# Patient Record
Sex: Male | Born: 1995 | Race: Black or African American | Hispanic: No | Marital: Single | Smoking: Current every day smoker
Health system: Southern US, Community
[De-identification: ages and names within clinical notes are randomized; demographics above are authoritative.]

## PROBLEM LIST (undated history)

## (undated) ENCOUNTER — Ambulatory Visit

## (undated) DIAGNOSIS — F319 Bipolar disorder, unspecified: Secondary | ICD-10-CM

## (undated) DIAGNOSIS — R519 Headache, unspecified: Secondary | ICD-10-CM

## (undated) HISTORY — DX: Headache, unspecified: R51.9

---
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
## 2016-05-26 ENCOUNTER — Emergency Department (HOSPITAL_COMMUNITY)
Admission: EM | Admit: 2016-05-26 | Discharge: 2016-05-27 | Disposition: A | Discharge status: Home / Self Care | Payer: Medicaid Other | Attending: Emergency Medicine | Admitting: Emergency Medicine

## 2016-05-26 ENCOUNTER — Emergency Department (HOSPITAL_COMMUNITY): Payer: Medicaid Other

## 2016-05-26 ENCOUNTER — Encounter (HOSPITAL_COMMUNITY): Payer: Self-pay

## 2016-05-26 DIAGNOSIS — M545 Low back pain, unspecified: Secondary | ICD-10-CM

## 2016-05-26 DIAGNOSIS — M542 Cervicalgia: Secondary | ICD-10-CM | POA: Diagnosis not present

## 2016-05-26 DIAGNOSIS — G8929 Other chronic pain: Secondary | ICD-10-CM

## 2016-05-26 DIAGNOSIS — F17290 Nicotine dependence, other tobacco product, uncomplicated: Secondary | ICD-10-CM | POA: Insufficient documentation

## 2016-05-26 DIAGNOSIS — M546 Pain in thoracic spine: Secondary | ICD-10-CM | POA: Diagnosis not present

## 2016-05-26 DIAGNOSIS — R202 Paresthesia of skin: Secondary | ICD-10-CM | POA: Diagnosis not present

## 2016-05-26 LAB — URINALYSIS, ROUTINE W REFLEX MICROSCOPIC
Bilirubin Urine: NEGATIVE
Glucose, UA: NEGATIVE mg/dL
Hgb urine dipstick: NEGATIVE
KETONES UR: NEGATIVE mg/dL
Leukocytes, UA: NEGATIVE
Nitrite: NEGATIVE
Protein, ur: NEGATIVE mg/dL
Specific Gravity, Urine: 1.019 (ref 1.005–1.030)
pH: 7 (ref 5.0–8.0)

## 2016-05-26 LAB — CBC
HEMATOCRIT: 43.6 % (ref 39.0–52.0)
HEMOGLOBIN: 14.8 g/dL (ref 13.0–17.0)
MCH: 29 pg (ref 26.0–34.0)
MCHC: 33.9 g/dL (ref 30.0–36.0)
MCV: 85.3 fL (ref 78.0–100.0)
Platelets: 209 10*3/uL (ref 150–400)
RBC: 5.11 MIL/uL (ref 4.22–5.81)
RDW: 14 % (ref 11.5–15.5)
WBC: 5.9 10*3/uL (ref 4.0–10.5)

## 2016-05-26 LAB — BASIC METABOLIC PANEL
ANION GAP: 9 (ref 5–15)
BUN: 10 mg/dL (ref 6–20)
CO2: 25 mmol/L (ref 22–32)
Calcium: 9.6 mg/dL (ref 8.9–10.3)
Chloride: 103 mmol/L (ref 101–111)
Creatinine, Ser: 0.97 mg/dL (ref 0.61–1.24)
Glucose, Bld: 116 mg/dL — ABNORMAL HIGH (ref 65–99)
POTASSIUM: 3.7 mmol/L (ref 3.5–5.1)
SODIUM: 137 mmol/L (ref 135–145)

## 2016-05-26 MED ORDER — METHOCARBAMOL 500 MG PO TABS
1000.0000 mg | ORAL_TABLET | Freq: Once | ORAL | Status: AC
  Administered 2016-05-27: 1000 mg via ORAL
  Filled 2016-05-26: qty 2

## 2016-05-26 MED ORDER — HYDROCODONE-ACETAMINOPHEN 5-325 MG PO TABS
1.0000 | ORAL_TABLET | Freq: Once | ORAL | Status: AC
  Administered 2016-05-27: 1 via ORAL
  Filled 2016-05-26: qty 1

## 2016-05-26 NOTE — ED Triage Notes (Signed)
Pt states he has had chronic back pain X2 years. He reports at times he cannot feel his finger tips or his ears. He also reports decreased appetite and generalized fatigue. Pt alert and oriented, ambulatory.

## 2016-05-26 NOTE — ED Notes (Signed)
Patient ambulatory to restroom and back with steady gait, slow to get out of the bed, but once he is up and walking, he states it gets easier but he still has severe back pain.

## 2016-05-26 NOTE — ED Notes (Signed)
Patient transported to X-ray 

## 2016-05-26 NOTE — ED Notes (Signed)
Pt angry about wait time. Attempted to explain delay, pt would not allow RN to explain delay. Pt is next to go back to a room

## 2016-05-26 NOTE — ED Provider Notes (Signed)
[facility]-EMERGENCY DEPT Provider Note   CSN: [id number] Arrival date & time: 05/26/16  1829     History   Chief Complaint Chief Complaint  Patient presents with  . Back Pain    HPI Jeffrey Chang is a 21 y.o. male.  Patient presents with complaint of worsening back pain in the upper back and lower back. Patient states that he has had this for about 2 years. He denies any acute injury but states that he had jobs that were very strenuous on his back with heavy lifting and pushing. He reports symptoms wax and wane but has gradually been worsening over this timeframe. Today he was unable to get out of bed to go to work prompting emergency department visit. He reports having paresthesias and numbness in his bilateral lower and upper extremities. No severe weakness however. He is able to walk but avoids twisting because this makes his pain worse. Sometimes he will take ibuprofen but doesn't like to take medications. He reports seeing a doctor once approximately one year ago when he had x-rays performed but no follow-up. The onset of this condition was acute on chronic. The course is constant.       History reviewed. No pertinent past medical history.  There are no active problems to display for this patient.   History reviewed. No pertinent surgical history.     Home Medications    Prior to Admission medications   Not on File    Family History History reviewed. No pertinent family history.  Social History Social History  Substance Use Topics  . Smoking status: Current Every Day Smoker    Types: Cigars  . Smokeless tobacco: Never Used  . Alcohol use No     Comment: occ     Allergies   Patient has no known allergies.   Review of Systems Review of Systems  Constitutional: Negative for fever and unexpected weight change.  Gastrointestinal: Negative for constipation.       Neg for fecal incontinence  Genitourinary: Negative for difficulty urinating, flank pain and  hematuria.       Negative for urinary incontinence or retention  Musculoskeletal: Positive for back pain.  Neurological: Positive for numbness. Negative for weakness.       Negative for saddle paresthesias      Physical Exam Updated Vital Signs BP (!) 152/97 (BP Location: Left Arm)   Pulse 78   Temp 98.3 F (36.8 C) (Oral)   Resp 16   SpO2 100%   Physical Exam  Constitutional: He appears well-developed and well-nourished.  HENT:  Head: Normocephalic and atraumatic.  Eyes: Conjunctivae are normal.  Neck: Normal range of motion.  Abdominal: Soft. There is no tenderness. There is no CVA tenderness.  Musculoskeletal:       Cervical back: He exhibits tenderness. He exhibits normal range of motion and no bony tenderness.       Thoracic back: He exhibits tenderness. He exhibits normal range of motion and no bony tenderness.       Lumbar back: He exhibits tenderness. He exhibits normal range of motion and no bony tenderness.       Back:  No step-off noted with palpation of spine.   Neurological: He is alert. He has normal reflexes. No sensory deficit. He exhibits normal muscle tone.  5/5 strength in entire lower extremities bilaterally. Reports decreased sensation in bilateral lower extremities to light touch. Patient ambulatory without foot drop but turns in a fashion where he does not twist his  back.  Skin: Skin is warm and dry.  Psychiatric: He has a normal mood and affect.  Nursing note and vitals reviewed.    ED Treatments / Results  Labs (all labs ordered are listed, but only abnormal results are displayed) Labs Reviewed  BASIC METABOLIC PANEL - Abnormal; Notable for the following:       Result Value   Glucose, Bld 116 (*)    All other components within normal limits  URINALYSIS, ROUTINE W REFLEX MICROSCOPIC - Abnormal; Notable for the following:    Color, Urine AMBER (*)    APPearance TURBID (*)    Bacteria, UA RARE (*)    Squamous Epithelial / LPF 0-5 (*)     Crystals PRESENT (*)    All other components within normal limits  CBC  CBG MONITORING, ED    EKG  EKG Interpretation None       Radiology Dg Cervical Spine Complete  Result Date: 05/26/2016 CLINICAL DATA:  Initial evaluation for chronic worsening neck and lower back pain, extremity numbness. EXAM: CERVICAL SPINE - COMPLETE 4+ VIEW COMPARISON:  None. FINDINGS: There is no evidence of cervical spine fracture or prevertebral soft tissue swelling. Alignment is normal. No other significant bone abnormalities are identified. Mild degenerative spondylolysis noted at C4-5. No significant bony foraminal narrowing. IMPRESSION: 1. No radiographic evidence for acute abnormality within cervical spine. 2. 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 Electronically Signed   By: Rise Mu M.D.   On: 05/26/2016 23:56   Dg Lumbar Spine Complete  Result Date: 05/26/2016 CLINICAL DATA:  Chronic lower back pain for 2 years. Initial encounter. EXAM: LUMBAR SPINE - COMPLETE 4+ VIEW COMPARISON:  None. FINDINGS: There is no evidence of fracture or subluxation. Vertebral bodies demonstrate normal height and alignment. Intervertebral disc spaces are preserved. The visualized neural foramina are grossly unremarkable in appearance. The visualized bowel gas pattern is unremarkable in appearance; air and stool are noted within the colon. The sacroiliac joints are within normal limits. IMPRESSION: No evidence of fracture or subluxation along the lumbar spine. Electronically Signed   By: Roanna Raider M.D.   On: 05/26/2016 23:55    Procedures Procedures (including critical care time)  Medications Ordered in ED Medications - No data to display   Initial Impression / Assessment and Plan / ED Course  I have reviewed the triage vital signs and the nursing notes.  Pertinent labs & imaging results that were available during my care of the patient were reviewed by me and considered in my medical decision making  (see chart for details).     11:20 PM Patient seen and examined. X-rays ordered. Medications ordered.   Vital signs reviewed and are as follows: Vitals:   05/26/16 1836  BP: (!) 152/97  Pulse: 78  Resp: 16  Temp: 98.3 F (36.8 C)   Will check imaging of the neck and lumbar spine tonight. Discussed limitations of these tests with patient and mother. Workup and treatment is being hindered by the fact that he does not have a primary care physician. He does not have any red flags. Discussed that likely we will only be able to treat his symptoms tonight. Discussed that he will need to follow-up with PCP and likely a specialist for further evaluation.   12:17 AM Patient and mother updated on x-ray results.   No red flag s/s of low back pain. Patient was counseled on back pain precautions and told to do activity as tolerated but do not lift, push, or  pull heavy objects more than 10 pounds for the next week.  Patient counseled to use ice or heat on back for no longer than 15 minutes every hour.   Patient counseled on proper use of muscle relaxant medication.  They were told not to drink alcohol, drive any vehicle, or do any dangerous activities while taking this medication.  Patient verbalized understanding.  Patient urged to follow-up with PCP if pain does not improve with treatment and rest or if pain becomes recurrent. Urged to return with worsening severe pain, loss of bowel or bladder control, trouble walking.   The patient verbalizes understanding and agrees with the plan.   Final Clinical Impressions(s) / ED Diagnoses   Final diagnoses:  Chronic bilateral thoracic back pain  Chronic bilateral low back pain without sciatica  Paresthesias   Patient with acute on chronic back pain. No new neurological deficits, does complain of constant ongoing decreased sensation. Patient is ambulatory. No warning symptoms of back pain including: fecal incontinence, urinary retention or overflow  incontinence, night sweats, waking from sleep with back pain, unexplained fevers or weight loss, h/o cancer, IVDU, recent trauma. No concern for cauda equina, epidural abscess, or other serious cause of back pain. Conservative measures such as rest, ice/heat and pain medicine indicated with PCP follow-up if no improvement with conservative management.    New Prescriptions New Prescriptions   METHOCARBAMOL (ROBAXIN) 500 MG TABLET    Take 2 tablets (1,000 mg total) by mouth 4 (four) times daily.   NAPROXEN (NAPROSYN) 500 MG TABLET    Take 1 tablet (500 mg total) by mouth 2 (two) times daily.     Renne Crigler, PA-C 05/27/16 0019    Renne Crigler, PA-C 05/27/16 0020    Marily Memos, MD 05/27/16 4098

## 2016-05-27 MED ORDER — NAPROXEN 500 MG PO TABS: 500.0000 mg | ORAL_TABLET | Freq: Two times a day (BID) | ORAL | 0 refills | Status: DC

## 2016-05-27 MED ORDER — METHOCARBAMOL 500 MG PO TABS: 1000.0000 mg | ORAL_TABLET | Freq: Four times a day (QID) | ORAL | 0 refills | Status: DC

## 2016-05-27 NOTE — Discharge Instructions (Signed)
Please read and follow all provided instructions.  Your diagnoses today include:  1. Chronic bilateral thoracic back pain   2. Chronic bilateral low back pain without sciatica   3. Paresthesias     Tests performed today include:  Vital signs - see below for your results today  Medications prescribed:   Naproxen - anti-inflammatory pain medication  Do not exceed  naproxen every 12 hours, take with food  You have been prescribed an anti-inflammatory medication or NSAID. Take with food. Take smallest effective dose for the shortest duration needed for your pain. Stop taking if you experience stomach pain or vomiting.    Robaxin (methocarbamol) - muscle relaxer medication  DO NOT drive or perform any activities that require you to be awake and alert because this medicine can make you drowsy.   Take any prescribed medications only as directed.  Home care instructions:   Follow any educational materials contained in this packet  Please rest, use ice or heat on your back for the next several days  Do not lift, push, pull anything more than 10 pounds for the next week  Follow-up instructions: Please follow-up with your primary care provider in the next 1 week for further evaluation of your symptoms.   Return instructions:  SEEK IMMEDIATE MEDICAL ATTENTION IF YOU HAVE:  New numbness, tingling, weakness, or problem with the use of your arms or legs  Severe back pain not relieved with medications  Loss control of your bowels or bladder  Increasing pain in any areas of the body (such as chest or abdominal pain)  Shortness of breath, dizziness, or fainting.   Worsening nausea (feeling sick to your stomach), vomiting, fever, or sweats  Any other emergent concerns regarding your health   Additional Information:  Your vital signs today were: BP (!) 152/97 (BP Location: Left Arm)    Pulse 78    Temp 98.3 F (36.8 C) (Oral)    Resp 16    SpO2 100%  If your blood  pressure (BP) was elevated above 135/85 this visit, please have this repeated by your doctor within one month. --------------

## 2016-06-05 ENCOUNTER — Encounter: Payer: Self-pay | Admitting: Physician Assistant

## 2016-06-05 ENCOUNTER — Ambulatory Visit: Payer: Medicaid Other | Attending: Internal Medicine | Admitting: Physician Assistant

## 2016-06-05 VITALS — BP 147/90 | HR 98 | Temp 98.4°F | Resp 16 | Wt 146.4 lb

## 2016-06-05 DIAGNOSIS — M4302 Spondylolysis, cervical region: Secondary | ICD-10-CM | POA: Diagnosis not present

## 2016-06-05 DIAGNOSIS — F1290 Cannabis use, unspecified, uncomplicated: Secondary | ICD-10-CM | POA: Diagnosis not present

## 2016-06-05 DIAGNOSIS — M542 Cervicalgia: Secondary | ICD-10-CM

## 2016-06-05 DIAGNOSIS — M545 Low back pain: Secondary | ICD-10-CM | POA: Diagnosis present

## 2016-06-05 NOTE — Progress Notes (Signed)
Patient ID: Jeffrey Chang, male   DOB: 1996-01-15, 21 y.o.   MRN: [id number]    Pavle Wiler, is a 21 y.o. male  [id number]  [id number]  DOB - Jul 06, 1995  Subjective:  Chief Complaint and HPI: Jeffrey Chang is a 21 y.o. male here today to establish care and for a follow up visit After being seen in the ED on 05/26/2016 for a 2 year h/o U&LBP.  Imaging of L-S spine was negative.  Imaging of C-spine with mild spondylolysis at C4-5 and otherwise negative.  He tried the Naproxen and methocarbamol and they made him feel strange.  His pain started to improve after about 3 days.  He is pain free now and even showed me a video of himself doing a back bend yesterday.  He admits to frequent marijuana use.     ED/Hospital notes reviewed.   Social History:  Currently working but about to go back to school. Family history:  ROS:   Constitutional:  No f/c, No night sweats, No unexplained weight loss. EENT:  No vision changes, No blurry vision, No hearing changes. No mouth, throat, or ear problems.  Respiratory: No cough, No SOB Cardiac: No CP, no palpitations GI:  No abd pain, No N/V/D. GU: No Urinary s/sx Musculoskeletal: No joint pain Neuro: No headache, no dizziness, no motor weakness.  Skin: No rash Endocrine:  No polydipsia. No polyuria.  Psych: Denies SI/HI  No problems updated.  ALLERGIES: No Known Allergies  PAST MEDICAL HISTORY: No past medical history on file.  MEDICATIONS AT HOME: Prior to Admission medications   Medication Sig Start Date End Date Taking? Authorizing Provider  methocarbamol (ROBAXIN) 500 MG tablet Take 2 tablets (1,000 mg total) by mouth 4 (four) times daily. 05/27/16   Renne Crigler, PA-C  naproxen (NAPROSYN) 500 MG tablet Take 1 tablet (500 mg total) by mouth 2 (two) times daily. 05/27/16   Renne Crigler, PA-C     Objective:  EXAM:   Vitals:   06/05/16 1548  BP: (!) 147/90  Pulse: 98  Resp: 16  Temp: 98.4 F (36.9 C)  TempSrc: Oral    SpO2: 98%  Weight: 146 lb 6.4 oz (66.4 kg)    General appearance : A&OX3. NAD. Non-toxic-appearing HEENT: Atraumatic and Normocephalic.  PERRLA. EOM intact.   Neck: supple, no JVD. No cervical lymphadenopathy. No thyromegaly Chest/Lungs:  Breathing-non-labored, Good air entry bilaterally, breath sounds normal without rales, rhonchi, or wheezing  CVS: S1 S2 regular, no murmurs, gallops, rubs  Extremities: Bilateral Lower Ext shows no edema, both legs are warm to touch with = pulse throughout Neurology:  CN II-XII grossly intact, Non focal.   Psych:  Affect is manic.  Pressured speech.  Overly expressive.  Grandiose thought processes with telling me he mastered calculus in 9th grade, can burn himself without feeling pain, is an expert in psychology, stretches for an hour every day, is going to start back to school to be an engineer-doctor.  TP is tangential and circumferential.  Very demonstrative and physically expressive. Skin:  No Rash  Data Review No results found for: HGBA1C   Assessment & Plan   1. Low back pain, unspecified back pain laterality, unspecified chronicity, with sciatica presence unspecified Resolved  2. Neck pain Resolved  3.  Marijuana use-cessation advised  4. ?Objective Mania w/o SI/HI or acute safety issues.  This is my first and only time seeing the patient.  He is very happy and upbeat.  Patient have been counseled extensively about nutrition  and exercise  Return in about 1 month (around 07/05/2016) for assign PCP and CPE with fasting blood work.  The patient was given clear instructions to go to ER or return to medical center if symptoms don't improve, worsen or new problems develop. The patient verbalized understanding. The patient was told to call to get lab results if they haven't heard anything in the next week.     Georgian Co, PA-C [facility] [place], [place] [phone]   06/05/2016, 4:01 PM

## 2016-07-06 ENCOUNTER — Encounter (HOSPITAL_COMMUNITY): Payer: Self-pay

## 2016-07-06 ENCOUNTER — Emergency Department (HOSPITAL_COMMUNITY)
Admission: EM | Admit: 2016-07-06 | Discharge: 2016-07-07 | Disposition: A | Discharge status: Home / Self Care | Payer: Managed Care, Other (non HMO) | Attending: Emergency Medicine | Admitting: Emergency Medicine

## 2016-07-06 ENCOUNTER — Emergency Department (HOSPITAL_COMMUNITY): Payer: Managed Care, Other (non HMO)

## 2016-07-06 DIAGNOSIS — F309 Manic episode, unspecified: Secondary | ICD-10-CM | POA: Diagnosis not present

## 2016-07-06 DIAGNOSIS — Z046 Encounter for general psychiatric examination, requested by authority: Secondary | ICD-10-CM

## 2016-07-06 DIAGNOSIS — F12150 Cannabis abuse with psychotic disorder with delusions: Secondary | ICD-10-CM | POA: Diagnosis not present

## 2016-07-06 DIAGNOSIS — F918 Other conduct disorders: Secondary | ICD-10-CM | POA: Diagnosis present

## 2016-07-06 DIAGNOSIS — R930 Abnormal findings on diagnostic imaging of skull and head, not elsewhere classified: Secondary | ICD-10-CM | POA: Insufficient documentation

## 2016-07-06 DIAGNOSIS — F12151 Cannabis abuse with psychotic disorder with hallucinations: Secondary | ICD-10-CM | POA: Diagnosis not present

## 2016-07-06 DIAGNOSIS — F17210 Nicotine dependence, cigarettes, uncomplicated: Secondary | ICD-10-CM | POA: Diagnosis not present

## 2016-07-06 DIAGNOSIS — F17290 Nicotine dependence, other tobacco product, uncomplicated: Secondary | ICD-10-CM | POA: Insufficient documentation

## 2016-07-06 DIAGNOSIS — Z79899 Other long term (current) drug therapy: Secondary | ICD-10-CM | POA: Diagnosis not present

## 2016-07-06 HISTORY — DX: Bipolar disorder, unspecified: F31.9

## 2016-07-06 LAB — CBC WITH DIFFERENTIAL/PLATELET
Basophils Absolute: 0.1 10*3/uL (ref 0.0–0.1)
Basophils Relative: 1 %
EOS ABS: 0.2 10*3/uL (ref 0.0–0.7)
EOS PCT: 3 %
HCT: 40.9 % (ref 39.0–52.0)
HEMOGLOBIN: 13.4 g/dL (ref 13.0–17.0)
Lymphocytes Relative: 34 %
Lymphs Abs: 2 10*3/uL (ref 0.7–4.0)
MCH: 28.8 pg (ref 26.0–34.0)
MCHC: 32.8 g/dL (ref 30.0–36.0)
MCV: 88 fL (ref 78.0–100.0)
MONOS PCT: 6 %
Monocytes Absolute: 0.4 10*3/uL (ref 0.1–1.0)
Neutro Abs: 3.3 10*3/uL (ref 1.7–7.7)
Neutrophils Relative %: 56 %
Platelets: 204 10*3/uL (ref 150–400)
RBC: 4.65 MIL/uL (ref 4.22–5.81)
RDW: 14.7 % (ref 11.5–15.5)
WBC: 5.9 10*3/uL (ref 4.0–10.5)

## 2016-07-06 LAB — RAPID URINE DRUG SCREEN, HOSP PERFORMED
AMPHETAMINES: NOT DETECTED
BENZODIAZEPINES: NOT DETECTED
Barbiturates: NOT DETECTED
Cocaine: NOT DETECTED
OPIATES: NOT DETECTED
Tetrahydrocannabinol: POSITIVE — AB

## 2016-07-06 LAB — ETHANOL: Alcohol, Ethyl (B): <5 mg/dL (ref <5)

## 2016-07-06 LAB — COMPREHENSIVE METABOLIC PANEL
ALT: 16 U/L — ABNORMAL LOW (ref 17–63)
ANION GAP: 6 (ref 5–15)
AST: 39 U/L (ref 15–41)
Albumin: 3.8 g/dL (ref 3.5–5.0)
Alkaline Phosphatase: 97 U/L (ref 38–126)
BUN: 20 mg/dL (ref 6–20)
CHLORIDE: 112 mmol/L — AB (ref 101–111)
CO2: 25 mmol/L (ref 22–32)
Calcium: 8.7 mg/dL — ABNORMAL LOW (ref 8.9–10.3)
Creatinine, Ser: 1.2 mg/dL (ref 0.61–1.24)
Glucose, Bld: 117 mg/dL — ABNORMAL HIGH (ref 65–99)
POTASSIUM: 4.3 mmol/L (ref 3.5–5.1)
Sodium: 143 mmol/L (ref 135–145)
Total Bilirubin: 0.5 mg/dL (ref 0.3–1.2)
Total Protein: 6.7 g/dL (ref 6.5–8.1)

## 2016-07-06 MED ORDER — LORAZEPAM 1 MG PO TABS: 0.0000 mg | ORAL_TABLET | Freq: Four times a day (QID) | ORAL | Status: DC

## 2016-07-06 MED ORDER — DIPHENHYDRAMINE HCL 50 MG/ML IJ SOLN
INTRAMUSCULAR | Status: AC
  Administered 2016-07-06: 25 mg via INTRAMUSCULAR
  Filled 2016-07-06: qty 1

## 2016-07-06 MED ORDER — LORAZEPAM 1 MG PO TABS: 0.0000 mg | ORAL_TABLET | Freq: Two times a day (BID) | ORAL | Status: DC

## 2016-07-06 MED ORDER — THIAMINE HCL 100 MG/ML IJ SOLN: 100.0000 mg | Freq: Every day | INTRAMUSCULAR | Status: DC

## 2016-07-06 MED ORDER — NICOTINE 21 MG/24HR TD PT24: 21.0000 mg | MEDICATED_PATCH | Freq: Every day | TRANSDERMAL | Status: DC

## 2016-07-06 MED ORDER — ACETAMINOPHEN 325 MG PO TABS: 650.0000 mg | ORAL_TABLET | ORAL | Status: DC | PRN

## 2016-07-06 MED ORDER — LORAZEPAM 2 MG/ML IJ SOLN
0.0000 mg | Freq: Four times a day (QID) | INTRAMUSCULAR | Status: DC
  Filled 2016-07-06: qty 1

## 2016-07-06 MED ORDER — LORAZEPAM 2 MG/ML IJ SOLN
0.0000 mg | Freq: Two times a day (BID) | INTRAMUSCULAR | Status: DC
Start: 2016-07-08 — End: 2016-07-07

## 2016-07-06 MED ORDER — LORAZEPAM 2 MG/ML IJ SOLN
1.0000 mg | Freq: Once | INTRAMUSCULAR | Status: AC
Start: 2016-07-06 — End: 2016-07-06
  Administered 2016-07-06: 1 mg via INTRAMUSCULAR

## 2016-07-06 MED ORDER — HALOPERIDOL LACTATE 5 MG/ML IJ SOLN
INTRAMUSCULAR | Status: AC
  Filled 2016-07-06: qty 1

## 2016-07-06 MED ORDER — LORAZEPAM 1 MG PO TABS
1.0000 mg | ORAL_TABLET | Freq: Once | ORAL | Status: AC
  Administered 2016-07-06: 1 mg via ORAL
  Filled 2016-07-06: qty 1

## 2016-07-06 MED ORDER — HALOPERIDOL LACTATE 5 MG/ML IJ SOLN
5.0000 mg | Freq: Once | INTRAMUSCULAR | Status: AC
Start: 2016-07-06 — End: 2016-07-06
  Administered 2016-07-06: 5 mg via INTRAMUSCULAR

## 2016-07-06 MED ORDER — DIPHENHYDRAMINE HCL 50 MG/ML IJ SOLN
25.0000 mg | Freq: Once | INTRAMUSCULAR | Status: AC
  Administered 2016-07-06: 25 mg via INTRAMUSCULAR

## 2016-07-06 MED ORDER — VITAMIN B-1 100 MG PO TABS
100.0000 mg | ORAL_TABLET | Freq: Every day | ORAL | Status: DC
  Administered 2016-07-06: 100 mg via ORAL
  Filled 2016-07-06: qty 1

## 2016-07-06 MED ORDER — DIPHENHYDRAMINE HCL 25 MG PO CAPS
50.0000 mg | ORAL_CAPSULE | Freq: Once | ORAL | Status: AC
  Administered 2016-07-06: 50 mg via ORAL
  Filled 2016-07-06: qty 2

## 2016-07-06 NOTE — ED Triage Notes (Addendum)
BIB [facility] Voluntarily w/ erratic and aggressive behavior toward his family. Mom reported to [facility] that pt has demonstrated paranoid delusions and hallucinations.

## 2016-07-06 NOTE — ED Notes (Signed)
Bed: [id number] Expected date:  Expected time:  Means of arrival:  Comments: [facility] Pt.

## 2016-07-06 NOTE — ED Notes (Addendum)
Vitals signs will be deferred until morning around 6:30am as to not disturb pt's sleep and escalate pt activity.  Rise and fall of pt's chest noted.  Respirations heard and pt's current RR is 18 breaths/min.  Will continue to monitor pt and will attempt vitals signs if pt awakes prior to the morning.

## 2016-07-06 NOTE — Progress Notes (Signed)
Pt with verbally aggressive behavior. Threatening to leave and expressing profanity to [facility] and hospital security. Pt out of room and attempting to leave. NP on call made aware. New orders given. Patient medicated per order. Derinda SisVera Aften Lipsey,rn.

## 2016-07-06 NOTE — [facility] Counselor (Signed)
Pt clinical information sent to the following facilities for review for IP admission:  [facility]  [facility] FlockMary Kobey S[place], [place], [facility], [facility] T[facility] Specialist [facility]
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
## 2016-07-07 DIAGNOSIS — F12150 Cannabis abuse with psychotic disorder with delusions: Secondary | ICD-10-CM | POA: Diagnosis present

## 2016-07-07 DIAGNOSIS — F17210 Nicotine dependence, cigarettes, uncomplicated: Secondary | ICD-10-CM

## 2016-07-07 HISTORY — DX: Cannabis abuse with psychotic disorder with delusions: F12.150

## 2016-07-07 NOTE — BH Assessment (Signed)
[facility] Assessment Progress Note  Per Thedore MinsMojeed Akintayo, MD, this pt does not require psychiatric hospitalization at this time.  Pt presents under IVC initiated by his mother, which Dr Jannifer FranklinAkintayo has rescinded.  [facility].  This has been included in pt's discharge instructions.  Pt's nurse, Kendal Hymendie, has been notified.  Doylene Canninghomas Roshunda Keir, MA Triage Specialist [phone]

## 2016-07-07 NOTE — ED Notes (Signed)
Pt discharged safely with discharge instructions reviewed.  All belongings were returned to pt.  Pt was in no distress.

## 2016-07-07 NOTE — Discharge Instructions (Signed)
For your ongoing behavioral health needs you are advised to follow up with [facility].  New patients are seen at their walk-in clinic.  Walk-in hours are Monday - Friday from 8:00 am - 12:00 pm, and from 1:00 pm - 3:00 pm.  Walk-in patients are seen on a first come, first served basis, so try to arrive as early as possible for the best chance of being seen the same day.  There is an initial fee of $22.50: ° °     [facility] °     [address] °     [place], [place] [address] °     [phone] °

## 2016-07-07 NOTE — ED Notes (Signed)
Pt oriented to room and unit.  Pt is pleasant.  He is very paranoid and delusional, believing someone has stolen his money.  Pt denies S/I but will hurt anyone that "messes with my money."  Pt denies AVH.  15 minute checks and video monitoring in place.
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
## 2016-07-07 NOTE — BHH Suicide Risk Assessment (Signed)
Suicide Risk Assessment  Discharge Assessment   [facility]   Principal Problem: Cannabis abuse with psychotic disorder, with delusions [facility](HCC) Discharge Diagnoses:  Patient Active Problem List   Diagnosis Date Noted  . Cannabis abuse with psychotic disorder, with delusions (HCC) [F12.150] 07/07/2016    Priority: High    Total Time spent with patient: 45 minutes  Musculoskeletal: Strength & Muscle Tone: within normal limits Gait & Station: normal Patient leans: N/A  Psychiatric Specialty Exam: Physical Exam  Constitutional: He is oriented to person, place, and time. He appears well-developed and well-nourished.  HENT:  Head: Normocephalic.  Neck: Normal range of motion.  Respiratory: Effort normal.  Musculoskeletal: Normal range of motion.  Neurological: He is alert and oriented to person, place, and time.  Psychiatric: He has a normal mood and affect. His speech is normal and behavior is normal. Judgment and thought content normal. Cognition and memory are normal.    Review of Systems  Psychiatric/Behavioral: Positive for substance abuse.  All other systems reviewed and are negative.   Blood pressure 114/75, pulse 65, temperature 98.1 F (36.7 C), temperature source Oral, resp. rate 18, SpO2 100 %.There is no height or weight on file to calculate BMI.  General Appearance: Casual  Eye Contact:  Good  Speech:  Normal Rate  Volume:  Normal  Mood:  Euthymic  Affect:  Congruent  Thought Process:  Coherent and Descriptions of Associations: Intact  Orientation:  Full (Time, Place, and Person)  Thought Content:  WDL and Logical  Suicidal Thoughts:  No  Homicidal Thoughts:  No  Memory:  Immediate;   Good Recent;   Good Remote;   Good  Judgement:  Fair  Insight:  Fair  Psychomotor Activity:  Normal  Concentration:  Concentration: Good and Attention Span: Good  Recall:  Good  Fund of Knowledge:  Good  Language:  Good  Akathisia:  No  Handed:   Right  AIMS (if indicated):     Assets:  Housing Leisure Time Physical Health Resilience Social Support  ADL's:  Intact  Cognition:  WNL  Sleep:       Mental Status Per Nursing Assessment::   On Admission:   cannabis abuse with delusions and hallucinations  Demographic Factors:  Male and Living alone  Loss Factors: NA  Historical Factors: NA  Risk Reduction Factors:   Sense of responsibility to family, Employed and Positive social support  Continued Clinical Symptoms:  None  Cognitive Features That Contribute To Risk:  None    Suicide Risk:  Minimal: No identifiable suicidal ideation.  Patients presenting with no risk factors but with morbid ruminations; may be classified as minimal risk based on the severity of the depressive symptoms    Plan Of Care/Follow-up recommendations:  Activity:  as tolerated Diet:  heart healthy diet  LORD, JAMISON, NP 07/07/2016, 10:36 AM

## 2016-07-07 NOTE — ED Notes (Signed)
Nurse called into room by sitter.  Patient reports he wants to go home.  Having religious delusions.  Flight of ideas.  Inappropriate name calling towards staff.

## 2016-07-19 ENCOUNTER — Encounter (HOSPITAL_COMMUNITY): Payer: Self-pay

## 2016-07-19 ENCOUNTER — Emergency Department (HOSPITAL_COMMUNITY)
Admission: EM | Admit: 2016-07-19 | Discharge: 2016-07-20 | Disposition: A | Discharge status: Other Acute Inpatient Hospital | Payer: Medicaid Other | Attending: Emergency Medicine | Admitting: Emergency Medicine

## 2016-07-19 DIAGNOSIS — Z79899 Other long term (current) drug therapy: Secondary | ICD-10-CM | POA: Diagnosis not present

## 2016-07-19 DIAGNOSIS — F17290 Nicotine dependence, other tobacco product, uncomplicated: Secondary | ICD-10-CM | POA: Insufficient documentation

## 2016-07-19 DIAGNOSIS — F3112 Bipolar disorder, current episode manic without psychotic features, moderate: Secondary | ICD-10-CM | POA: Diagnosis not present

## 2016-07-19 DIAGNOSIS — F99 Mental disorder, not otherwise specified: Secondary | ICD-10-CM | POA: Diagnosis present

## 2016-07-19 LAB — BASIC METABOLIC PANEL
Anion gap: 6 (ref 5–15)
BUN: 10 mg/dL (ref 6–20)
CHLORIDE: 105 mmol/L (ref 101–111)
CO2: 28 mmol/L (ref 22–32)
CREATININE: 0.93 mg/dL (ref 0.61–1.24)
Calcium: 9.1 mg/dL (ref 8.9–10.3)
GFR calc Af Amer: >60 mL/min (ref >60)
GFR calc non Af Amer: >60 mL/min (ref >60)
Glucose, Bld: 107 mg/dL — ABNORMAL HIGH (ref 65–99)
POTASSIUM: 4 mmol/L (ref 3.5–5.1)
Sodium: 139 mmol/L (ref 135–145)

## 2016-07-19 LAB — CBC
HCT: 44.6 % (ref 39.0–52.0)
HEMOGLOBIN: 14.9 g/dL (ref 13.0–17.0)
MCH: 29.2 pg (ref 26.0–34.0)
MCHC: 33.4 g/dL (ref 30.0–36.0)
MCV: 87.3 fL (ref 78.0–100.0)
Platelets: 222 10*3/uL (ref 150–400)
RBC: 5.11 MIL/uL (ref 4.22–5.81)
RDW: 14.3 % (ref 11.5–15.5)
WBC: 5.6 10*3/uL (ref 4.0–10.5)

## 2016-07-19 LAB — ETHANOL: Alcohol, Ethyl (B): <5 mg/dL (ref <5)

## 2016-07-19 LAB — RAPID URINE DRUG SCREEN, HOSP PERFORMED
AMPHETAMINES: NOT DETECTED
BENZODIAZEPINES: NOT DETECTED
Barbiturates: NOT DETECTED
Cocaine: NOT DETECTED
Opiates: NOT DETECTED
TETRAHYDROCANNABINOL: NOT DETECTED

## 2016-07-19 NOTE — ED Triage Notes (Signed)
He is brought in by a [employer] Officer in handcuffs. He is very loquacious and gives a meandering, paranoid stream-of-thought. [employer] Removed his handcuffs shortly after arrival d/t pt. Being cooperative. [employer] State they have taken out IVC paperwork d/t pt. Stating to the police officer that he wanted to kill himself.

## 2016-07-19 NOTE — BH Assessment (Signed)
[facility] Assessment Progress Note Case was staffed with [place] NP who recommended a inpatient admission as appropriate bed placement is investigated.       
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
## 2016-07-19 NOTE — ED Notes (Signed)
Pt awake, alert & responsive, no distress noted, calm & cooperative at present.  Watching TV at present.  Monitoring for safety, Q 15 min checks in effect.

## 2016-07-19 NOTE — Progress Notes (Signed)
Pt meets criteria for inpt treatment. TTS faxed referrals to the following inpt facilities for review:  [facility], [facility], [facility], [facility], [facility], [facility]   TTS will continue to seek bed placement.  Princess BruinsAquicha Zandrea Kenealy, LCSWA TTS [phone]

## 2016-07-19 NOTE — ED Notes (Signed)
Patient has one belonging bag at triage nurse's desk.  Patient wanded by security

## 2016-07-19 NOTE — BH Assessment (Addendum)
Assessment Note  Jeffrey Chang is an 21 y.o. male that presents this date under IVC. Per IVC: Respondent called [employer] to the residence by mother, who said patient was holding a sword stating he wants to kill himself. This writer attempted to assess patient although patient is manic being very tangential and cannot be redirected. Patient explains to this Clinical research associatewriter about the lottery process and is very animated using his hands to "draw numbers" in the air. Patient is oriented to time/place but denies any S/I, H/I or AVH. Patient denies the content/s of the IVC and asks this writer to read the name on the IVC. After this writer reads back the patients name he stated that "it was not him" and would like to be called "the grand mathematician." This Clinical research associatewriter confirmed patient's identify by checking name bractlet. As this Clinical research associatewriter attempts to ask questions on Investment banker, corporateassessment writer responds "what" to every question. Information for purposes of assessment was obtained from admission notes. Per notes, patient has a history of bipolar disorder and is brought to the emergency department by the police department after [employer] was called to his residence and the patient was threatening family members with a sword.  Patient presents with IVC paperwork taken out by the police department.  Patient is currently not on any medications. He is always a manic on arrival and has pressured speech he is brought in by a [employer] in handcuffs. He is very loquacious and gives a meandering, paranoid stream-of-thought. [employer] Removed his handcuffs shortly after arrival d/t pt. Being cooperative. [employer] State they have taken out IVC paperwork d/t pt. Stating to the police officer that he wanted to kill himself. Per note review, patient was last admitted inpatient on 07/06/16 under IVC by mother for similar psychotic behaviors. Case was staffed with Rankin NP who recommended a inpatient admission as appropriate bed placement is investigated.  Diagnosis: Bipolar  (per notes)  Past Medical History:  Past Medical History:  Diagnosis Date  . Bipolar 1 disorder (HCC)   . Manic depressive disorder (HCC)     No past surgical history on file.  Family History: No family history on file.  Social History:  reports that he has been smoking Cigars.  He has never used smokeless tobacco. He reports that he does not drink alcohol. His drug history is not on file.  Additional Social History:  Alcohol / Drug Use Pain Medications: please see mar Prescriptions: please see mar Over the Counter: please see mar History of alcohol / drug use?: Yes Longest period of sobriety (when/how long): unknown Negative Consequences of Use:  (Denies) Withdrawal Symptoms:  (Denies) Substance #1 Name of Substance 1: marijuana 1 - Age of First Use: unknown 1 - Amount (size/oz): Different amounts 1 - Frequency: daily 1 - Duration: ongoing 1 - Last Use / Amount: 07/19/16 1 gram  CIWA:   COWS:    Allergies: No Known Allergies  Home Medications:  (Not in a hospital admission)  OB/GYN Status:  No LMP for male patient.  General Assessment Data [facility] of [facility]: [facility] TTS Assessment: In system Is this a Tele or Face-to-Face Assessment?: Face-to-Face Is this an Initial Assessment or a Re-assessment for this encounter?: Initial Assessment Marital status: Single Maiden name: NA Is patient pregnant?: No Pregnancy Status: No Living Arrangements: Parent Can pt return to current living arrangement?: Yes Admission Status: Involuntary Is patient capable of signing voluntary admission?: Yes Referral Source: Self/Family/Friend Insurance type: Designer, industrial/product[employer]etna  Medical Screening Exam [facility]) Medical Exam  completed: Yes  Crisis Care Plan Living Arrangements: Parent Legal Guardian:  (Self) Name of Psychiatrist: None Name of Therapist: None  Education Status Is patient currently in school?: No Current Grade: NA Highest grade of school patient has completed:  12 Name of school: NA Contact person: NA  Risk to self with the past 6 months Suicidal Ideation: No Has patient been a risk to self within the past 6 months prior to admission? : No Suicidal Intent: No Has patient had any suicidal intent within the past 6 months prior to admission? : No Is patient at risk for suicide?: Yes (Per IVC) Suicidal Plan?: No Has patient had any suicidal plan within the past 6 months prior to admission? : No Access to Means: No What has been your use of drugs/alcohol within the last 12 months?: Cannabis Previous Attempts/Gestures: No How many times?: 0 Other Self Harm Risks: NA Triggers for Past Attempts: Unknown Intentional Self Injurious Behavior: None Family Suicide History: No Recent stressful life event(s): Other (Comment) (Family problems) Persecutory voices/beliefs?: No Depression: No Depression Symptoms:  (NA) Substance abuse history and/or treatment for substance abuse?: Yes Suicide prevention information given to non-admitted patients: Not applicable  Risk to Others within the past 6 months Homicidal Ideation: No Does patient have any lifetime risk of violence toward others beyond the six months prior to admission? : No Thoughts of Harm to Others: No Current Homicidal Intent: No Current Homicidal Plan: No Access to Homicidal Means: No Identified Victim: NA History of harm to others?: No Assessment of Violence: None Noted Violent Behavior Description: NA Does patient have access to weapons?: No Criminal Charges Pending?: No Does patient have a court date: No Is patient on probation?: No  Psychosis Hallucinations: None noted Delusions: None noted  Mental Status Report Appearance/Hygiene: Unremarkable Eye Contact: Fair Motor Activity: Freedom of movement Speech: Tangential Level of Consciousness: Restless Mood: Anxious Affect: Anxious Anxiety Level: Moderate Thought Processes: Tangential, Flight of Ideas Judgement:  Impaired Orientation: Person, Place Obsessive Compulsive Thoughts/Behaviors: None  Cognitive Functioning Concentration: Normal Memory: Recent Intact IQ: Average Insight: Fair Impulse Control: Poor Appetite: Fair Weight Loss: 0 Weight Gain: 0 Sleep: No Change Total Hours of Sleep: 7 Vegetative Symptoms: None  ADLScreening [facility] Assessment Services) Patient's cognitive ability adequate to safely complete daily activities?: Yes Patient able to express need for assistance with ADLs?: Yes Independently performs ADLs?: Yes (appropriate for developmental age)  Prior Inpatient Therapy Prior Inpatient Therapy: Yes Prior Therapy Dates: 2017 Prior Therapy Facilty/Provider(s): [facility] Reason for Treatment: MH issues  Prior Outpatient Therapy Prior Outpatient Therapy: No Prior Therapy Dates: NA Prior Therapy Facilty/Provider(s): NA Reason for Treatment: NA Does patient have an ACCT team?: No Does patient have Intensive In-House Services?  : No Does patient have [facility] services? : No Does patient have P4CC services?: No  ADL Screening (condition at time of admission) Patient's cognitive ability adequate to safely complete daily activities?: Yes Is the patient deaf or have difficulty hearing?: No Does the patient have difficulty seeing, even when wearing glasses/contacts?: No Does the patient have difficulty concentrating, remembering, or making decisions?: Yes Patient able to express need for assistance with ADLs?: Yes Does the patient have difficulty dressing or bathing?: No Independently performs ADLs?: Yes (appropriate for developmental age) Does the patient have difficulty walking or climbing stairs?: No Weakness of Legs: None Weakness of Arms/Hands: None  Home Assistive Devices/Equipment Home Assistive Devices/Equipment: None  Therapy Consults (therapy consults require a physician order) PT Evaluation Needed: No OT Evalulation Needed:  No SLP Evaluation Needed:  No Abuse/Neglect Assessment (Assessment to be complete while patient is alone) Physical Abuse: Denies Verbal Abuse: Denies Sexual Abuse: Denies Exploitation of patient/patient's resources: Denies Self-Neglect: Denies Values / Beliefs Cultural Requests During Hospitalization: None Spiritual Requests During Hospitalization: None Consults Spiritual Care Consult Needed: No Social Work Consult Needed: No Merchant navy officer (For Healthcare) Does Patient Have a Medical Advance Directive?: No Would patient like information on creating a medical advance directive?: No - Patient declined    Additional Information 1:1 In Past 12 Months?: No CIRT Risk: No Elopement Risk: No Does patient have medical clearance?: Yes     Disposition: Case was staffed with Rankin NP who recommended a inpatient admission as appropriate bed placement is investigated.   Disposition Initial Assessment Completed for this Encounter: Yes Disposition of Patient: Inpatient treatment program Type of inpatient treatment program: Adult  On Site Evaluation by:   Reviewed with Physician:    Alfredia Ferguson 07/19/2016 3:57 PM

## 2016-07-19 NOTE — ED Provider Notes (Signed)
[facility]-EMER[facility]ENCY DEPT Provider Note   CSN: [id number] Arrival date & time: 07/19/16  1115     History   Chief Complaint Chief Complaint  Patient presents with  . Mental Health Problem   Level V caveat: Psychiatric disturbance  HPI Jeffrey Chang is a 21 y.o. male.  HPI Patient has a history of bipolar disorder and is brought to the emergency department by the police department after placement called to the house and the patient was threatening family members with a sword.  Patient presents with IVC paperwork taken out by the police department.  Patient is currently not on any medications.  He is always a manic on arrival and has pressured speech.   Past Medical History:  Diagnosis Date  . Bipolar 1 disorder (HCC)   . Manic depressive disorder [facility])     Patient Active Problem List   Diagnosis Date Noted  . Cannabis abuse with psychotic disorder, with delusions (HCC) 07/07/2016    No past surgical history on file.     Home Medications    Prior to Admission medications   Medication Sig Start Date End Date Taking? Authorizing Provider  methocarbamol (ROBAXIN) 500 MG tablet Take 2 tablets (1,000 mg total) by mouth 4 (four) times daily. Patient not taking: Reported on 07/06/2016 05/27/16   Renne Crigler, PA-C  naproxen (NAPROSYN) 500 MG tablet Take 1 tablet (500 mg total) by mouth 2 (two) times daily. Patient not taking: Reported on 07/06/2016 05/27/16   Renne Crigler, PA-C    Family History No family history on file.  Social History Social History  Substance Use Topics  . Smoking status: Current Every Day Smoker    Types: Cigars  . Smokeless tobacco: Never Used  . Alcohol use No     Comment: occ     Allergies   Patient has no known allergies.   Review of Systems Review of Systems  Unable to perform ROS: Psychiatric disorder     Physical Exam Updated Vital Signs There were no vitals taken for this visit.  Physical Exam  Constitutional: He is  oriented to person, place, and time. He appears well-developed and well-nourished. No distress.  HENT:  Head: Normocephalic and atraumatic.  Eyes: EOM are normal.  Neck: Normal range of motion.  Cardiovascular: Normal rate, regular rhythm, normal heart sounds and intact distal pulses.   Pulmonary/Chest: Effort normal and breath sounds normal. No respiratory distress.  Abdominal: Soft. He exhibits no distension. There is no tenderness.  Musculoskeletal: Normal range of motion.  Neurological: He is alert and oriented to person, place, and time.  Skin: Skin is warm and dry.  Psychiatric: He has a normal mood and affect. His speech is rapid and/or pressured. He is hyperactive. He is not aggressive. Thought content is not paranoid. Cognition and memory are normal. He expresses impulsivity. He expresses no homicidal and no suicidal ideation. He is inattentive.  Nursing note and vitals reviewed.    ED Treatments / Results  Labs (all labs ordered are listed, but only abnormal results are displayed) Labs Reviewed  CBC  BASIC METABOLIC PANEL  ETHANOL  RAPID URINE DRUG SCREEN, HOSP PERFORMED    EKG  EKG Interpretation None       Radiology No results found.  Procedures Procedures (including critical care time)  Medications Ordered in ED Medications - No data to display   Initial Impression / Assessment and Plan / ED Course  I have reviewed the triage vital signs and the nursing notes.  Pertinent  labs & imaging results that were available during my care of the patient were reviewed by me and considered in my medical decision making (see chart for details).     TTS to evaluate.  Medically clear.  IVC paperwork present.  Final Clinical Impressions(s) / ED Diagnoses   Final diagnoses:  None    New Prescriptions New Prescriptions   No medications on file     Azalia Bilisampos, Nelsie Domino, MD 07/19/16 1421

## 2016-07-20 DIAGNOSIS — F319 Bipolar disorder, unspecified: Secondary | ICD-10-CM

## 2016-07-20 MED ORDER — LORAZEPAM 1 MG PO TABS
2.0000 mg | ORAL_TABLET | Freq: Once | ORAL | Status: AC
  Administered 2016-07-20: 2 mg via ORAL
  Filled 2016-07-20: qty 2

## 2016-07-20 NOTE — Consult Note (Signed)
Spoke with patient regarding transfer to [facility].  He continues to present as gradiose, hyperreligious and manic with psychomotor agitation.  He was agreeable to ativan for anxiety and calming.  Understands he is under IVC.

## 2016-07-20 NOTE — BH Assessment (Signed)
Patient accepted to [facility] by Dr. Dorothy SparkGacemgeci. Room #301. Nursing report # [phone]. Patient is under IVC and awaiting sheriff transport [facility].

## 2016-07-20 NOTE — ED Notes (Signed)
Nursing discharged note : Pt is cooperative, [facility]. Pt transported via [employer]. Belongings given.

## 2016-07-20 NOTE — ED Notes (Signed)
SHERIFFS TRANSPORTATION REQUESTED. 

## 2016-07-20 NOTE — Progress Notes (Signed)
CSW filed patient's examination and recommendation paperwork into IVC logbook.  Yacqub Baston, LCSWA [facility]  Clinical Social Worker [phone] 

## 2016-07-20 NOTE — ED Notes (Signed)
21 y/o male cooperative, delusional and paranoid. " I have to [facility] the hogs at [facility] and you know I have a video of someone stealing [facility] [employer]," Spoke to [employer] Thorpe who will be transporting pt. To [facility] at 11 am. Report called to Wilkie AyeKristy [phone] Pt will be going to rm 301

## 2016-07-20 NOTE — ED Notes (Signed)
PT ACCEPTED [facility] AFTER 10AM, 07/20/2016.  NURSE TO NURSE REPORT NUMBER IS [phone]. ACCEPTING PHYSICIAN IS DR GACENGECI.

## 2018-10-26 ENCOUNTER — Other Ambulatory Visit: Payer: Self-pay

## 2018-10-26 ENCOUNTER — Ambulatory Visit (INDEPENDENT_AMBULATORY_CARE_PROVIDER_SITE_OTHER): Payer: Self-pay

## 2018-10-26 ENCOUNTER — Encounter (HOSPITAL_COMMUNITY): Payer: Self-pay | Admitting: Urgent Care

## 2018-10-26 ENCOUNTER — Ambulatory Visit (HOSPITAL_COMMUNITY)
Admission: EM | Admit: 2018-10-26 | Discharge: 2018-10-26 | Disposition: A | Discharge status: Home / Self Care | Payer: Self-pay | Attending: Urgent Care | Admitting: Urgent Care

## 2018-10-26 DIAGNOSIS — S6991XA Unspecified injury of right wrist, hand and finger(s), initial encounter: Secondary | ICD-10-CM

## 2018-10-26 DIAGNOSIS — M79641 Pain in right hand: Secondary | ICD-10-CM

## 2018-10-26 DIAGNOSIS — S62652A Nondisplaced fracture of medial phalanx of right middle finger, initial encounter for closed fracture: Secondary | ICD-10-CM

## 2018-10-26 MED ORDER — NAPROXEN 500 MG PO TABS: 500.0000 mg | ORAL_TABLET | Freq: Two times a day (BID) | ORAL | 0 refills | Status: DC

## 2018-10-26 NOTE — ED Provider Notes (Signed)
  MRN: [id number] DOB: 1995/07/03  Subjective:   Jeffrey Chang is a 23 y.o. male presenting for 4 month hx of persistent right hand pain, worse over middle finger. He punched a glass window in May 2020, reinjured his hand in early July 2020. Has had persistent aching pain with associated swelling and difficulty fully extending his finger. Has tried ibuprofen with minimal relief.  He did purchase a finger splint on his own and reports that his finger feels better when he uses it.  Generally at work he is not using a lot of tools or working with his hands.  Denies taking any chronic medications.  No Known Allergies  Past Medical History:  Diagnosis Date  . Bipolar 1 disorder ([place])   . Manic depressive disorder ([place])      No past surgical history on file.  ROS  Objective:   Vitals: BP (!) 156/73 (BP Location: Right Arm)   Pulse 76   Temp 98.1 F (36.7 C) (Oral)   Resp 18   Wt 185 lb (83.9 kg)   SpO2 100%   Physical Exam  Dg Hand Complete Right  Result Date: 10/26/2018 CLINICAL DATA:  23 year old male with trauma to the right hand. EXAM: RIGHT HAND - COMPLETE 3+ VIEW COMPARISON:  None. FINDINGS: There is a small avulsion fracture of the volar base of the middle phalanx of the third digit. No other acute fracture identified. There is no dislocation. The bones are well mineralized. No arthritic changes. Mild soft tissue swelling of the third digit. No radiopaque foreign object or soft tissue gas. IMPRESSION: Avulsion fracture of the volar base of the middle phalanx of the third digit. Electronically Signed   By: Anner Crete M.D.   On: 10/26/2018 10:27     Assessment and Plan :   1. Nondisplaced fracture of middle phalanx of right middle finger, initial encounter for closed fracture   2. Right hand pain   3. Hand injury, right, initial encounter     Patient has avulsion fracture of middle phalanx of the third right finger.  It is unknown when he obtained this fracture  whether it was in May or July.  Recommended patient use finger splint while at work, naproxen for pain and inflammation.  Provided patient with information for hand specialist so that he could obtain consult when he has insurance. Counseled patient on potential for adverse effects with medications prescribed/recommended today, ER and return-to-clinic precautions discussed, patient verbalized understanding.    Jaynee Eagles, [place] 10/26/18 1115

## 2018-10-26 NOTE — ED Triage Notes (Signed)
Pt cc hand injury from May 2020. Pt states he didn't have any insurance so he waited until he got some to come to the doctors. Pt states he having pain in his right hand middle finger after punching a glass window in May 2020.

## 2019-01-24 IMAGING — DX DG CERVICAL SPINE COMPLETE 4+V
5 series · 5 of 5 positions shown · non-contrast
Comparison: None.

CLINICAL DATA: Initial evaluation for chronic worsening neck and
lower back pain, extremity numbness.

EXAM:
CERVICAL SPINE - COMPLETE 4+ VIEW

[c-spine lat]
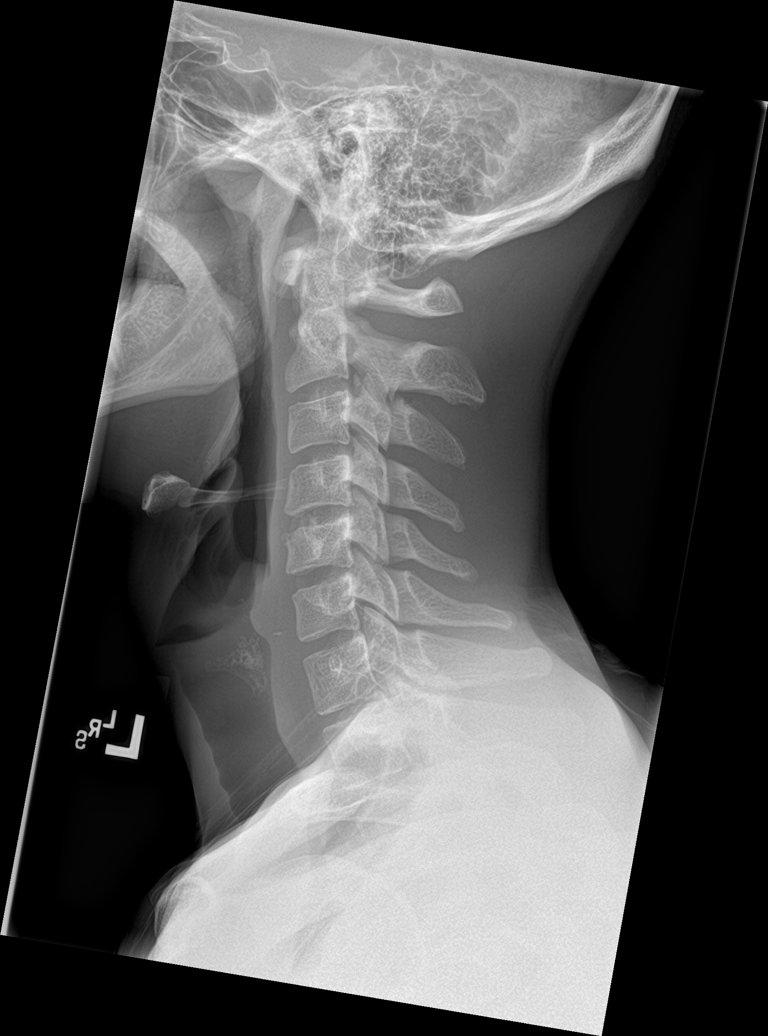

[c-spine obl (1 of 2)]
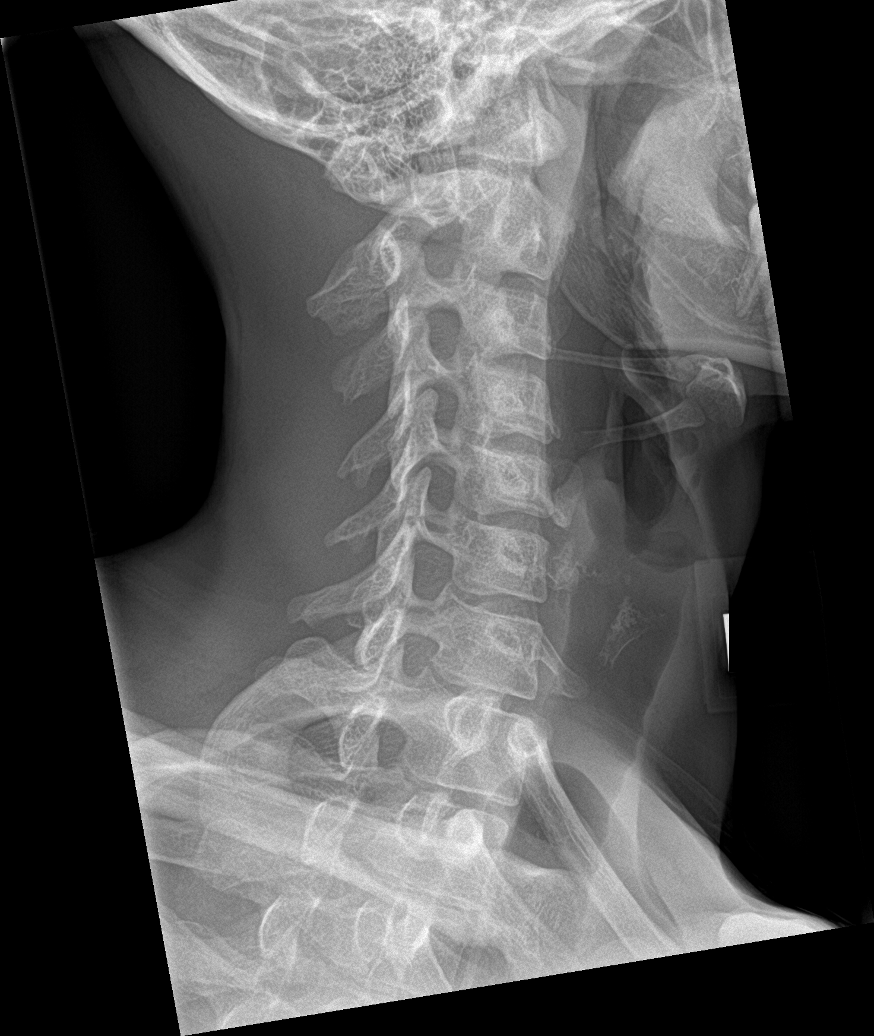

[c-spine obl (2 of 2)]
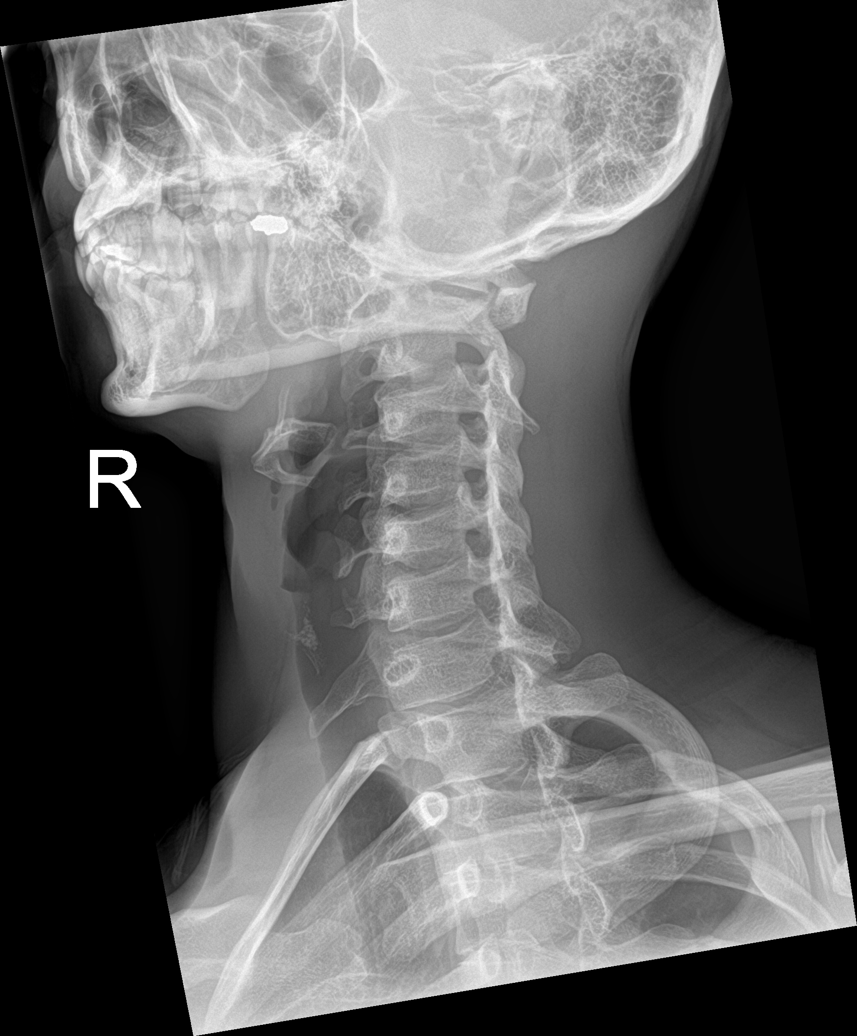

[c-spine ap]
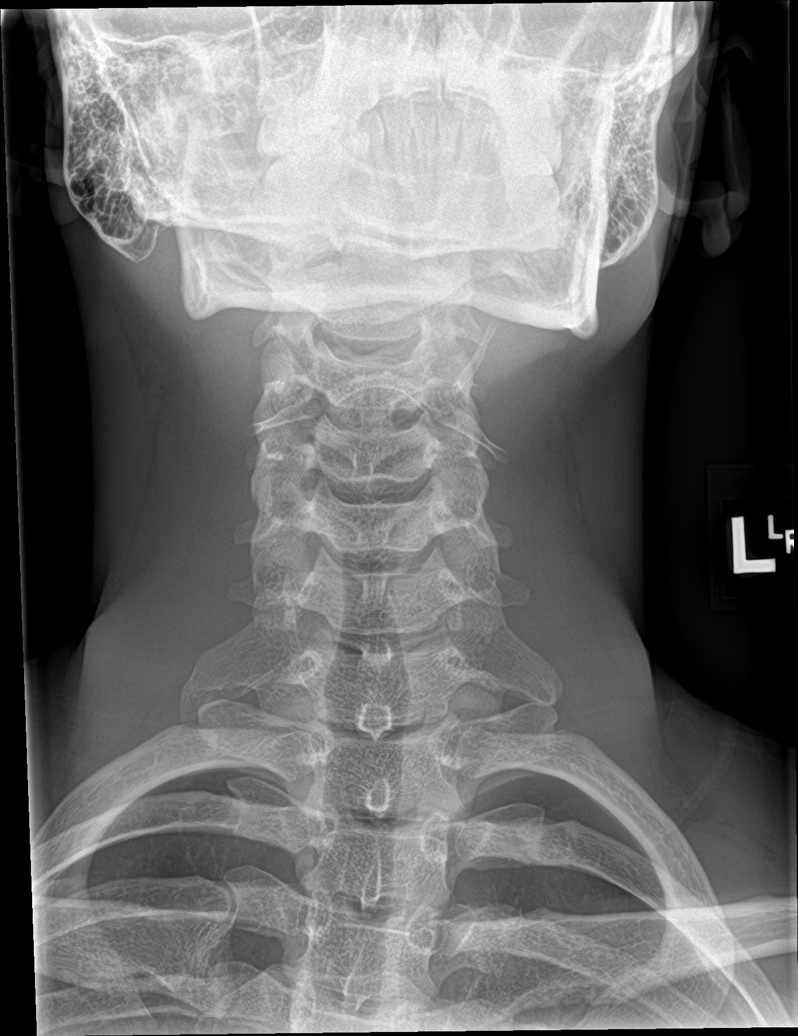

[c-spine open mouth]
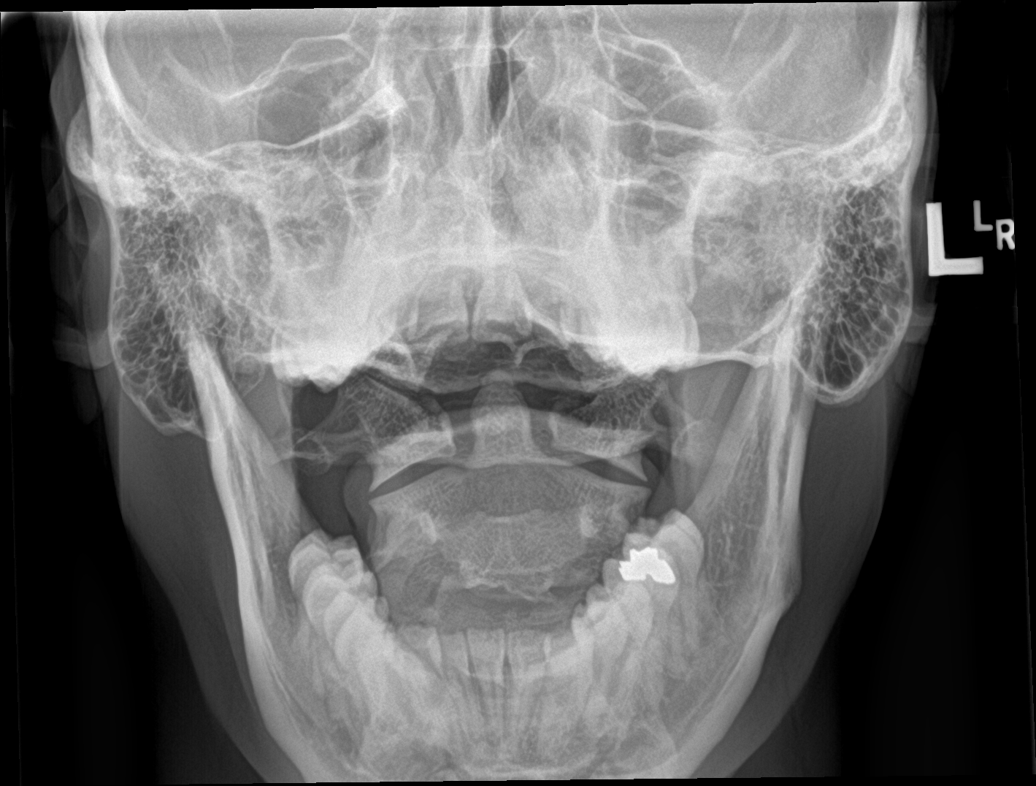

[5 of 5 positions shown; findings below may reference images not displayed]

FINDINGS: There is no evidence of cervical spine fracture or prevertebral soft
tissue swelling. Alignment is normal. No other significant bone
abnormalities are identified.

Mild degenerative spondylolysis noted at C4-5. No significant bony
foraminal narrowing.
IMPRESSION: 1. No radiographic evidence for acute abnormality within cervical
spine.
2. Mild degenerative spondylolysis at C4-5.

## 2019-06-08 ENCOUNTER — Ambulatory Visit
Admission: EM | Admit: 2019-06-08 | Discharge: 2019-06-08 | Disposition: A | Discharge status: Home / Self Care | Payer: BC Managed Care – PPO | Attending: Emergency Medicine | Admitting: Emergency Medicine

## 2019-06-08 ENCOUNTER — Encounter: Payer: Self-pay | Admitting: Emergency Medicine

## 2019-06-08 ENCOUNTER — Other Ambulatory Visit: Payer: Self-pay

## 2019-06-08 DIAGNOSIS — J302 Other seasonal allergic rhinitis: Secondary | ICD-10-CM | POA: Diagnosis present

## 2019-06-08 DIAGNOSIS — K122 Cellulitis and abscess of mouth: Secondary | ICD-10-CM

## 2019-06-08 DIAGNOSIS — H60313 Diffuse otitis externa, bilateral: Secondary | ICD-10-CM | POA: Diagnosis present

## 2019-06-08 LAB — POCT RAPID STREP A (OFFICE): Rapid Strep A Screen: NEGATIVE

## 2019-06-08 MED ORDER — FLUTICASONE PROPIONATE 50 MCG/ACT NA SUSP: 1.0000 | Freq: Every day | NASAL | 0 refills | Status: DC

## 2019-06-08 MED ORDER — NEOMYCIN-POLYMYXIN-HC 3.5-10000-1 OT SOLN: 3.0000 [drp] | Freq: Three times a day (TID) | OTIC | 0 refills | Status: AC

## 2019-06-08 MED ORDER — NAPROXEN 500 MG PO TABS: 500.0000 mg | ORAL_TABLET | Freq: Two times a day (BID) | ORAL | 0 refills | Status: DC

## 2019-06-08 NOTE — ED Provider Notes (Addendum)
[name]    CSN: [id number] Arrival date & time: 06/08/19  1730      History   Chief Complaint Chief Complaint  Patient presents with  . Ear Problem    HPI Jeffrey Chang is a 24 y.o. male with history of BPD type I presenting for 2-week course of intermittent ear pain bilaterally.  Denying trauma, travel, discharge, prolonged water exposure.  States currently R>L.  No change in hearing, tinnitus, fever.  Does have some nasal congestion: Endorses history of seasonal allergies.  No known sick contacts.  Denies cough, chest pain, difficulty breathing.  Tried OTC swimmer's ear drops without relief.   Past Medical History:  Diagnosis Date  . Bipolar 1 disorder (HCC)   . Manic depressive disorder [facility])     Patient Active Problem List   Diagnosis Date Noted  . Cannabis abuse with psychotic disorder, with delusions (HCC) 07/07/2016    History reviewed. No pertinent surgical history.     Home Medications    Prior to Admission medications   Medication Sig Start Date End Date Taking? Authorizing Provider  loratadine-pseudoephedrine (CLARITIN-D 24-HOUR) 10-240 MG 24 hr tablet Take 1 tablet by mouth daily.   Yes [provider]  fluticasone (FLONASE) 50 MCG/ACT nasal spray Place 1 spray into both nostrils daily. 06/08/19   Hall-Potvin, [place], PA-C  naproxen (NAPROSYN) 500 MG tablet Take 1 tablet (500 mg total) by mouth 2 (two) times daily. 06/08/19   Hall-Potvin, [place], PA-C  neomycin-polymyxin-hydrocortisone (CORTISPORIN) OTIC solution Place 3 drops into both ears 3 (three) times daily for 7 days. 06/08/19 06/15/19  Hall-Potvin, [place], PA-C    Family History History reviewed. No pertinent family history.  Social History Social History   Tobacco Use  . Smoking status: Current Every Day Smoker    Types: Cigars  . Smokeless tobacco: Never Used  Substance Use Topics  . Alcohol use: No    Comment: occ  . Drug use: Not on file     Allergies    Latex   Review of Systems As per HPI   Physical Exam Triage Vital Signs ED Triage Vitals  Enc Vitals Group     BP 06/08/19 1752 (!) 142/83     Pulse Rate 06/08/19 1752 73     Resp 06/08/19 1752 16     Temp 06/08/19 1752 98.4 F (36.9 C)     Temp Source 06/08/19 1752 Oral     SpO2 06/08/19 1752 98 %     Weight --      Height --      Head Circumference --      Peak Flow --      Pain Score 06/08/19 1810 8     Pain Loc --      Pain Edu? --      Excl. in GC? --    No data found.  Updated Vital Signs BP (!) 142/83 (BP Location: Left Arm)   Pulse 73   Temp 98.4 F (36.9 C) (Oral)   Resp 16   SpO2 98%   Visual Acuity Right Eye Distance:   Left Eye Distance:   Bilateral Distance:    Right Eye Near:   Left Eye Near:    Bilateral Near:     Physical Exam Constitutional:      General: He is not in acute distress.    Appearance: He is normal weight. He is not ill-appearing.  HENT:     Head: Normocephalic and atraumatic.  Right Ear: Tympanic membrane and external ear normal.     Left Ear: Tympanic membrane and external ear normal.     Ears:     Comments: Mild tragal tenderness bilaterally.  Right EAC with erythema as compared to left.  No foreign body bilaterally.  Mild EAC swelling bilaterally.  Patient endorsing pain with otoscope bilaterally    Nose: No nasal deformity, congestion or rhinorrhea.     Comments: Turbinates edematous bilaterally with pink mucosa    Mouth/Throat:     Mouth: Mucous membranes are moist.     Tongue: Tongue does not deviate from midline.     Pharynx: Oropharynx is clear. Uvula midline.     Comments: No tonsillar hypertrophy or exudate.  Uvula mildly edematous. Eyes:     General: No scleral icterus.    Conjunctiva/sclera: Conjunctivae normal.     Pupils: Pupils are equal, round, and reactive to light.  Neck:     Comments: Left-sided cervical LAD Cardiovascular:     Rate and Rhythm: Normal rate.  Pulmonary:     Effort: Pulmonary  effort is normal. No respiratory distress.     Breath sounds: No wheezing.  Musculoskeletal:     Cervical back: Normal range of motion and neck supple. No tenderness. No muscular tenderness.  Lymphadenopathy:     Cervical: Cervical adenopathy present.  Neurological:     Mental Status: He is alert.      UC Treatments / Results  Labs (all labs ordered are listed, but only abnormal results are displayed) Labs Reviewed  POCT RAPID STREP A (OFFICE) - Normal  CULTURE, GROUP A STREP [facility])    EKG   Radiology No results found.  Procedures Procedures (including critical care time)  Medications Ordered in UC Medications - No data to display  Initial Impression / Assessment and Plan / UC Course  I have reviewed the triage vital signs and the nursing notes.  Pertinent labs & imaging results that were available during my care of the patient were reviewed by me and considered in my medical decision making (see chart for details).     Patient afebrile, nontoxic in office today.  Patient has had symptoms greater than 10 days without sudden worsening.  No cough, fever, difficulty breathing.  Low yield for Covid testing: Deferred at this time at patient's request, to which I am agreeable.  Rapid strep negative, culture pending.  Will treat supportively as outlined below.  H&P consistent with acute otitis externa bilaterally.  Will start Cortisporin drops.  We will also treat seasonal allergies as outlined below.  Return precautions discussed, patient verbalized understanding and is agreeable to plan. Final Clinical Impressions(s) / UC Diagnoses   Final diagnoses:  Uvulitis  Acute diffuse otitis externa of both ears  Seasonal allergies     Discharge Instructions     Your rapid strep test was negative today.  The culture is pending.  Please look on your MyChart for test results.   We will notify you if the culture positive and outline a treatment plan at that time.   Please continue  Tylenol and/or Ibuprofen as needed for fever, pain.  May try warm salt water gargles, cepacol lozenges, throat spray, warm tea or water with lemon/honey, or OTC cold relief medicine for throat discomfort.   For congestion: take a daily anti-histamine like Zyrtec, Claritin, and a oral decongestant to help with post nasal drip that may be irritating your throat.   It is important to stay hydrated: drink plenty  of fluids (primarily water) to keep your throat moisturized and help further relieve irritation/discomfort.     ED Prescriptions    Medication Sig Dispense Auth. Provider   naproxen (NAPROSYN) 500 MG tablet Take 1 tablet (500 mg total) by mouth 2 (two) times daily. 30 tablet Hall-Potvin, [place], PA-C   fluticasone (FLONASE) 50 MCG/ACT nasal spray Place 1 spray into both nostrils daily. 16 g Hall-Potvin, [place], PA-C   neomycin-polymyxin-hydrocortisone (CORTISPORIN) OTIC solution Place 3 drops into both ears 3 (three) times daily for 7 days. 10 mL Hall-Potvin, [place], PA-C     PDMP not reviewed this encounter.   Hall-Potvin, [place], PA-C 06/08/19 1929    Odette Fraction [place], [place] 06/08/19 1930

## 2019-06-08 NOTE — Discharge Instructions (Addendum)

## 2019-06-08 NOTE — ED Triage Notes (Addendum)
Ear pain for 2 weeks.  Patient reports both ears are hurting Denies cough or runny nose.  Patient reports feeling off balance intermittently.

## 2019-06-11 LAB — CULTURE, GROUP A STREP (THRC)

## 2019-09-20 ENCOUNTER — Ambulatory Visit
Admission: EM | Admit: 2019-09-20 | Discharge: 2019-09-20 | Disposition: A | Discharge status: Home / Self Care | Payer: BC Managed Care – PPO | Attending: Emergency Medicine | Admitting: Emergency Medicine

## 2019-09-20 ENCOUNTER — Encounter: Payer: Self-pay | Admitting: Emergency Medicine

## 2019-09-20 ENCOUNTER — Other Ambulatory Visit: Payer: Self-pay

## 2019-09-20 DIAGNOSIS — J0111 Acute recurrent frontal sinusitis: Secondary | ICD-10-CM | POA: Diagnosis not present

## 2019-09-20 MED ORDER — AMOXICILLIN-POT CLAVULANATE 875-125 MG PO TABS: 1.0000 | ORAL_TABLET | Freq: Two times a day (BID) | ORAL | 0 refills | Status: DC

## 2019-09-20 NOTE — Discharge Instructions (Addendum)
Take antibiotic twice daily with food. Important to drink plenty of water throughout the day. Return for worsening pain, cough, fever.

## 2019-09-20 NOTE — ED Provider Notes (Signed)
[name]    CSN: [id number] Arrival date & time: 09/20/19  1655      History   Chief Complaint Chief Complaint  Patient presents with  . Nasal Congestion    HPI Jeffrey Chang is a 25 y.o. male with history of BPD type I, MDD presenting for 3-week course of nasal congestion, frontal sinus pressure and pain.  Patient declines Covid testing, "not see anybody like that ".  Did not get vaccine.  Does admit to marijuana use: No change thereof.  States that he did OTC medications including allergy, nasal spray with temporary relief, though over this past week has worsened again.  No fever, cough, difficulty breathing.    Past Medical History:  Diagnosis Date  . Bipolar 1 disorder (HCC)   . Manic depressive disorder [facility])     Patient Active Problem List   Diagnosis Date Noted  . Cannabis abuse with psychotic disorder, with delusions (HCC) 07/07/2016    History reviewed. No pertinent surgical history.     Home Medications    Prior to Admission medications   Medication Sig Start Date End Date Taking? Authorizing Provider  amoxicillin-clavulanate (AUGMENTIN) 875-125 MG tablet Take 1 tablet by mouth every 12 (twelve) hours. 09/20/19   Hall-Potvin, [place], PA-C  fluticasone (FLONASE) 50 MCG/ACT nasal spray Place 1 spray into both nostrils daily. 06/08/19   Hall-Potvin, [place], PA-C  loratadine-pseudoephedrine (CLARITIN-D 24-HOUR) 10-240 MG 24 hr tablet Take 1 tablet by mouth daily.    [provider]  naproxen (NAPROSYN) 500 MG tablet Take 1 tablet (500 mg total) by mouth 2 (two) times daily. 06/08/19   Hall-Potvin, [place], PA-C    Family History History reviewed. No pertinent family history.  Social History Social History   Tobacco Use  . Smoking status: Current Every Day Smoker    Types: Cigars  . Smokeless tobacco: Never Used  Substance Use Topics  . Alcohol use: No    Comment: occ  . Drug use: Not on file     Allergies    Latex   Review of Systems As per HPI   Physical Exam Triage Vital Signs ED Triage Vitals [09/20/19 1754]  Enc Vitals Group     BP 124/82     Pulse Rate 80     Resp 18     Temp 98.8 F (37.1 C)     Temp Source Oral     SpO2 97 %     Weight      Height      Head Circumference      Peak Flow      Pain Score 5     Pain Loc      Pain Edu?      Excl. in GC?    No data found.  Updated Vital Signs BP 124/82 (BP Location: Left Arm)   Pulse 80   Temp 98.8 F (37.1 C) (Oral)   Resp 18   SpO2 97%   Visual Acuity Right Eye Distance:   Left Eye Distance:   Bilateral Distance:    Right Eye Near:   Left Eye Near:    Bilateral Near:     Physical Exam Constitutional:      General: He is not in acute distress.    Appearance: He is not toxic-appearing or diaphoretic.  HENT:     Head: Normocephalic and atraumatic.     Right Ear: Tympanic membrane and external ear normal.     Left Ear: Tympanic membrane and  external ear normal.     Ears:     Comments: Bilateral EAC with injection, no discharge    Nose:     Comments: Frontal sinus tenderness and mild bilateral maxillary sinus tenderness.  Turbinates edematous, pink    Mouth/Throat:     Mouth: Mucous membranes are moist.     Pharynx: Oropharynx is clear.  Eyes:     General: No scleral icterus.    Conjunctiva/sclera: Conjunctivae normal.     Pupils: Pupils are equal, round, and reactive to light.  Neck:     Comments: Trachea midline, negative JVD Cardiovascular:     Rate and Rhythm: Normal rate and regular rhythm.  Pulmonary:     Effort: Pulmonary effort is normal. No respiratory distress.     Breath sounds: No wheezing.  Musculoskeletal:     Cervical back: Neck supple. No tenderness.  Lymphadenopathy:     Cervical: No cervical adenopathy.  Skin:    Capillary Refill: Capillary refill takes less than 2 seconds.     Coloration: Skin is not jaundiced or pale.     Findings: No rash.  Neurological:     Mental  Status: He is alert and oriented to person, place, and time.      UC Treatments / Results  Labs (all labs ordered are listed, but only abnormal results are displayed) Labs Reviewed - No data to display  EKG   Radiology No results found.  Procedures Procedures (including critical care time)  Medications Ordered in UC Medications - No data to display  Initial Impression / Assessment and Plan / UC Course  I have reviewed the triage vital signs and the nursing notes.  Pertinent labs & imaging results that were available during my care of the patient were reviewed by me and considered in my medical decision making (see chart for details).     Patient febrile, nontoxic in office today.  Given duration of symptoms Covid testing is deferred.  We will treat for ABS as outlined below.  Return precautions discussed, pt verbalized understanding and is agreeable to plan. Final Clinical Impressions(s) / UC Diagnoses   Final diagnoses:  Acute recurrent frontal sinusitis     Discharge Instructions     Take antibiotic twice daily with food. Important to drink plenty of water throughout the day. Return for worsening pain, cough, fever.    ED Prescriptions    Medication Sig Dispense Auth. Provider   amoxicillin-clavulanate (AUGMENTIN) 875-125 MG tablet Take 1 tablet by mouth every 12 (twelve) hours. 14 tablet Hall-Potvin, [place], PA-C     PDMP not reviewed this encounter.   Hall-Potvin, [place], [place] 09/20/19 1851

## 2019-09-20 NOTE — ED Triage Notes (Signed)
Pt here for nasal congestion and pain x 3 weeks; pt declines covid testing at present

## 2019-10-10 ENCOUNTER — Ambulatory Visit
Admission: EM | Admit: 2019-10-10 | Discharge: 2019-10-10 | Discharge status: Against Medical Advice | Payer: BC Managed Care – PPO

## 2019-10-11 ENCOUNTER — Ambulatory Visit
Admission: EM | Admit: 2019-10-11 | Discharge: 2019-10-11 | Disposition: A | Discharge status: Home / Self Care | Payer: BC Managed Care – PPO | Attending: Emergency Medicine | Admitting: Emergency Medicine

## 2019-10-11 ENCOUNTER — Other Ambulatory Visit: Payer: Self-pay

## 2019-10-11 ENCOUNTER — Encounter: Payer: Self-pay | Admitting: Emergency Medicine

## 2019-10-11 DIAGNOSIS — H5789 Other specified disorders of eye and adnexa: Secondary | ICD-10-CM

## 2019-10-11 DIAGNOSIS — H579 Unspecified disorder of eye and adnexa: Secondary | ICD-10-CM | POA: Diagnosis not present

## 2019-10-11 MED ORDER — POLYMYXIN B-TRIMETHOPRIM 10000-0.1 UNIT/ML-% OP SOLN: 1.0000 [drp] | Freq: Four times a day (QID) | OPHTHALMIC | 0 refills | Status: DC

## 2019-10-11 NOTE — Discharge Instructions (Signed)
Begin Polytrim eyedrops every 4 hours Warm compresses Follow-up with ophthalmology if continuing to have foreign body sensation in eye and symptoms continuing despite use of antibiotics If symptoms worsening, developing increased pain, change in vision follow-up here or emergency room

## 2019-10-11 NOTE — ED Provider Notes (Signed)
[name]    CSN: [id number] Arrival date & time: 10/11/19  0840      History   Chief Complaint Chief Complaint  Patient presents with   Eye Pain    HPI Jeffrey Chang is a 24 y.o. male presenting today for evaluation of eye irritation.  Patient reports over the past 4 days he has had sensation of foreign body in his right eye.  Symptoms began while he was at school, is in school for welding.  He does not believe he has a piece of metal in his eye, believes may be dust from sandpaper.  He has been rubbing his eye a lot which is subsequently caused swelling.  Swelling has resolved.  Has tried over-the-counter eyedrops without relief.  Has had some mild photophobia.  Denies drainage. Denies contact use.    HPI  Past Medical History:  Diagnosis Date   Bipolar 1 disorder (HCC)    Manic depressive disorder [facility])     Patient Active Problem List   Diagnosis Date Noted   Cannabis abuse with psychotic disorder, with delusions (HCC) 07/07/2016    History reviewed. No pertinent surgical history.     Home Medications    Prior to Admission medications   Medication Sig Start Date End Date Taking? Authorizing Provider  fluticasone (FLONASE) 50 MCG/ACT nasal spray Place 1 spray into both nostrils daily. 06/08/19   Hall-Potvin, [place], PA-C  loratadine-pseudoephedrine (CLARITIN-D 24-HOUR) 10-240 MG 24 hr tablet Take 1 tablet by mouth daily.    [provider]  naproxen (NAPROSYN) 500 MG tablet Take 1 tablet (500 mg total) by mouth 2 (two) times daily. 06/08/19   Hall-Potvin, [place], PA-C  trimethoprim-polymyxin b (POLYTRIM) ophthalmic solution Place 1-2 drops into the right eye every 6 (six) hours. 10/11/19   Amaal Dimartino, [place], PA-C    Family History History reviewed. No pertinent family history.  Social History Social History   Tobacco Use   Smoking status: Current Every Day Smoker    Types: Cigars   Smokeless tobacco: Never Used  Substance  Use Topics   Alcohol use: No    Comment: occ   Drug use: Not on file     Allergies   Latex   Review of Systems Review of Systems  Constitutional: Negative for activity change, appetite change, chills, fatigue and fever.  HENT: Negative for congestion, ear pain, rhinorrhea, sinus pressure, sore throat and trouble swallowing.   Eyes: Positive for photophobia and pain. Negative for discharge, redness and visual disturbance.  Respiratory: Negative for cough, chest tightness and shortness of breath.   Cardiovascular: Negative for chest pain.  Gastrointestinal: Negative for abdominal pain, diarrhea, nausea and vomiting.  Musculoskeletal: Negative for myalgias.  Skin: Negative for rash.  Neurological: Negative for dizziness, light-headedness and headaches.     Physical Exam Triage Vital Signs ED Triage Vitals [10/11/19 0910]  Enc Vitals Group     BP 128/77     Pulse Rate 78     Resp 18     Temp 98.3 F (36.8 C)     Temp Source Oral     SpO2 98 %     Weight      Height      Head Circumference      Peak Flow      Pain Score 5     Pain Loc      Pain Edu?      Excl. in GC?    No data found.  Updated Vital  Signs BP 128/77 (BP Location: Right Arm)    Pulse 78    Temp 98.3 F (36.8 C) (Oral)    Resp 18    SpO2 98%   Visual Acuity Right Eye Distance: 20/20 Left Eye Distance: 20/20 Bilateral Distance: 20/20  Right Eye Near:   Left Eye Near:    Bilateral Near:     Physical Exam Vitals and nursing note reviewed.  Constitutional:      Appearance: He is well-developed.     Comments: No acute distress  HENT:     Head: Normocephalic and atraumatic.     Nose: Nose normal.  Eyes:     Extraocular Movements: Extraocular movements intact.     Conjunctiva/sclera: Conjunctivae normal.     Pupils: Pupils are equal, round, and reactive to light.     Comments: Very faint erythema to right eye, no periorbital swelling or erythema, no photophobia with exam, no fluorescein  uptake  Cardiovascular:     Rate and Rhythm: Normal rate.  Pulmonary:     Effort: Pulmonary effort is normal. No respiratory distress.  Abdominal:     General: There is no distension.  Musculoskeletal:        General: Normal range of motion.     Cervical back: Neck supple.  Skin:    General: Skin is warm and dry.  Neurological:     Mental Status: He is alert and oriented to person, place, and time.      UC Treatments / Results  Labs (all labs ordered are listed, but only abnormal results are displayed) Labs Reviewed - No data to display  EKG   Radiology No results found.  Procedures Procedures (including critical care time)  Medications Ordered in UC Medications - No data to display  Initial Impression / Assessment and Plan / UC Course  I have reviewed the triage vital signs and the nursing notes.  Pertinent labs & imaging results that were available during my care of the patient were reviewed by me and considered in my medical decision making (see chart for details).     No sign of corneal abrasion or ulcer, no foreign body visualized, lids swept and did not visualize any foreign body removal.  No significant photophobia with exam, do not suspect iritis.  Treating with Polytrim to cover for infection given possible foreign body and recommended follow-up with ophthalmology for more thorough eye exam to rule out any small foreign body not visualized by naked eye.  Discussed strict return precautions. Patient verbalized understanding and is agreeable with plan.  Final Clinical Impressions(s) / UC Diagnoses   Final diagnoses:  Sensation of foreign body in eye  Eye irritation     Discharge Instructions     Begin Polytrim eyedrops every 4 hours Warm compresses Follow-up with ophthalmology if continuing to have foreign body sensation in eye and symptoms continuing despite use of antibiotics If symptoms worsening, developing increased pain, change in vision  follow-up here or emergency room    ED Prescriptions    Medication Sig Dispense Auth. Provider   trimethoprim-polymyxin b (POLYTRIM) ophthalmic solution Place 1-2 drops into the right eye every 6 (six) hours. 10 mL Rikayla Demmon, [place] C, PA-C     PDMP not reviewed this encounter.   Lew Dawes, [place] 10/11/19 1044

## 2019-10-11 NOTE — ED Triage Notes (Signed)
Pt here for right eye pain after getting something in his right eye at work

## 2020-01-18 ENCOUNTER — Ambulatory Visit: Payer: Self-pay

## 2020-01-20 ENCOUNTER — Ambulatory Visit: Payer: Self-pay

## 2020-01-23 ENCOUNTER — Ambulatory Visit
Admission: RE | Admit: 2020-01-23 | Discharge: 2020-01-23 | Disposition: A | Discharge status: Home / Self Care | Payer: Self-pay | Source: Ambulatory Visit | Attending: Emergency Medicine | Admitting: Emergency Medicine

## 2020-01-23 ENCOUNTER — Other Ambulatory Visit: Payer: Self-pay

## 2020-01-23 VITALS — BP 131/81 | HR 85 | Temp 99.3°F | Resp 20 | Ht 72.0 in | Wt 180.0 lb

## 2020-01-23 DIAGNOSIS — J302 Other seasonal allergic rhinitis: Secondary | ICD-10-CM

## 2020-01-23 DIAGNOSIS — M545 Low back pain, unspecified: Secondary | ICD-10-CM

## 2020-01-23 MED ORDER — FLUTICASONE PROPIONATE 50 MCG/ACT NA SUSP: 1.0000 | Freq: Every day | NASAL | 0 refills | Status: DC

## 2020-01-23 MED ORDER — NAPROXEN 500 MG PO TABS: 500.0000 mg | ORAL_TABLET | Freq: Two times a day (BID) | ORAL | 0 refills | Status: DC

## 2020-01-23 MED ORDER — CETIRIZINE HCL 10 MG PO TABS: 10.0000 mg | ORAL_TABLET | Freq: Every day | ORAL | 0 refills | Status: DC

## 2020-01-23 MED ORDER — PREDNISONE 20 MG PO TABS: 20.0000 mg | ORAL_TABLET | Freq: Every day | ORAL | 0 refills | Status: DC

## 2020-01-23 NOTE — ED Provider Notes (Signed)
[name]    CSN: [id number] Arrival date & time: 01/23/20  1552      History   Chief Complaint Chief Complaint  Patient presents with  . Facial Pain    APPT  . Abdominal Pain    HPI Jeffrey Chang is a 24 y.o. male  Presenting for multiple concerns: Endorsing 2-week course of sinus pressure, maxillary, bilateral.  Has taken OTC cough and cold medication with some relief.  States he gets this every year: Has run out of fluticasone, which typically provides relief.  No known sick contacts, fever, difficulty breathing or cough. Patient also noting bilateral low back pain.  States has been intermittent for the last month: Worse with certain movements.  Denies injury, trauma to affected area, urinary retention or fecal incontinence.  No numbness or weakness.  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   Past Medical History:  Diagnosis Date  . Bipolar 1 disorder (HCC)   . Manic depressive disorder [facility])     Patient Active Problem List   Diagnosis Date Noted  . Cannabis abuse with psychotic disorder, with delusions (HCC) 07/07/2016    History reviewed. No pertinent surgical history.     Home Medications    Prior to Admission medications   Medication Sig Start Date End Date Taking? Authorizing Provider  cetirizine (ZYRTEC ALLERGY) 10 MG tablet Take 1 tablet (10 mg total) by mouth daily. 01/23/20   Hall-Potvin, [place], PA-C  fluticasone (FLONASE) 50 MCG/ACT nasal spray Place 1 spray into both nostrils daily. 01/23/20   Hall-Potvin, [place], PA-C  naproxen (NAPROSYN) 500 MG tablet Take 1 tablet (500 mg total) by mouth 2 (two) times daily. 01/23/20   Hall-Potvin, [place], PA-C  predniSONE (DELTASONE) 20 MG tablet Take 1 tablet (20 mg total) by mouth daily. 01/23/20   Hall-Potvin, [place], PA-C  trimethoprim-polymyxin b (POLYTRIM) ophthalmic solution Place 1-2 drops into the right eye every 6 (six) hours. 10/11/19    Wieters, Junius Creamer, PA-C    Family History History reviewed. No pertinent family history.  Social History Social History   Tobacco Use  . Smoking status: Current Every Day Smoker    Types: Cigars  . Smokeless tobacco: Never Used  Substance Use Topics  . Alcohol use: No    Comment: occ     Allergies   Latex   Review of Systems Review of Systems  Constitutional: Negative for fatigue and fever.  HENT: Positive for congestion. Negative for dental problem, ear pain, facial swelling, hearing loss, sinus pain, sore throat, trouble swallowing and voice change.   Eyes: Negative for photophobia, pain and visual disturbance.  Respiratory: Negative for cough and shortness of breath.   Cardiovascular: Negative for chest pain and palpitations.  Gastrointestinal: Negative for diarrhea and vomiting.  Musculoskeletal: Positive for back pain. Negative for arthralgias and myalgias.  Neurological: Negative for dizziness and headaches.     Physical Exam Triage Vital Signs ED Triage Vitals  Enc Vitals Group     BP 01/23/20 1629 131/81     Pulse Rate 01/23/20 1629 85     Resp 01/23/20 1629 20     Temp 01/23/20 1629 99.3 F (37.4 C)     Temp Source 01/23/20 1629 Oral     SpO2 01/23/20 1629 97 %     Weight 01/23/20 1635 180 lb (81.6 kg)     Height 01/23/20 1635 6' (1.829 m)     Head Circumference --  Peak Flow --      Pain Score 01/23/20 1635 7     Pain Loc --      Pain Edu? --      Excl. in GC? --    No data found.  Updated Vital Signs BP 131/81 (BP Location: Left Arm)   Pulse 85   Temp 99.3 F (37.4 C) (Oral)   Resp 20   Ht 6' (1.829 m)   Wt 180 lb (81.6 kg)   SpO2 97%   BMI 24.41 kg/m   Visual Acuity Right Eye Distance:   Left Eye Distance:   Bilateral Distance:    Right Eye Near:   Left Eye Near:    Bilateral Near:     Physical Exam Constitutional:      General: He is not in acute distress.    Appearance: He is not toxic-appearing or diaphoretic.   HENT:     Head: Normocephalic and atraumatic.     Right Ear: Tympanic membrane and ear canal normal.     Left Ear: Tympanic membrane and ear canal normal.     Mouth/Throat:     Mouth: Mucous membranes are moist.     Pharynx: Oropharynx is clear.  Eyes:     General: No scleral icterus.    Conjunctiva/sclera: Conjunctivae normal.     Pupils: Pupils are equal, round, and reactive to light.  Neck:     Comments: Trachea midline, negative JVD Cardiovascular:     Rate and Rhythm: Normal rate and regular rhythm.  Pulmonary:     Effort: Pulmonary effort is normal. No respiratory distress.     Breath sounds: No wheezing.  Musculoskeletal:     Cervical back: Neck supple. No tenderness.     Comments: Lateral low back pain that spares spinous process, PSIS.  No crepitus, mass.  Lymphadenopathy:     Cervical: No cervical adenopathy.  Skin:    Capillary Refill: Capillary refill takes less than 2 seconds.     Coloration: Skin is not jaundiced or pale.     Findings: No bruising or rash.  Neurological:     General: No focal deficit present.     Mental Status: He is alert and oriented to person, place, and time.      UC Treatments / Results  Labs (all labs ordered are listed, but only abnormal results are displayed) Labs Reviewed - No data to display  EKG   Radiology No results found.  Procedures Procedures (including critical care time)  Medications Ordered in UC Medications - No data to display  Initial Impression / Assessment and Plan / UC Course  I have reviewed the triage vital signs and the nursing notes.  Pertinent labs & imaging results that were available during my care of the patient were reviewed by me and considered in my medical decision making (see chart for details).     Appears well in office today and is without alarm signs/symptoms as mentioned in HPI, PE.  Low back pain likely MSK: We will treat supportively as below.  Sinusitis likely second to seasonal  allergies.  Not currently doing intranasal steroid spray, antihistamine: We will start those.  Return precautions discussed, pt verbalized understanding and is agreeable to plan. Final Clinical Impressions(s) / UC Diagnoses   Final diagnoses:  Seasonal allergic rhinitis, unspecified trigger  Acute bilateral low back pain without sciatica   Discharge Instructions   None    ED Prescriptions    Medication Sig Dispense Auth. Provider  naproxen (NAPROSYN) 500 MG tablet Take 1 tablet (500 mg total) by mouth 2 (two) times daily. 30 tablet Hall-Potvin, [place], PA-C   fluticasone (FLONASE) 50 MCG/ACT nasal spray Place 1 spray into both nostrils daily. 16 g Hall-Potvin, [place], PA-C   cetirizine (ZYRTEC ALLERGY) 10 MG tablet Take 1 tablet (10 mg total) by mouth daily. 30 tablet Hall-Potvin, [place], PA-C   predniSONE (DELTASONE) 20 MG tablet Take 1 tablet (20 mg total) by mouth daily. 5 tablet Hall-Potvin, [place], PA-C     PDMP not reviewed this encounter.   Hall-Potvin, [place], [place] 01/23/20 1801

## 2020-01-23 NOTE — ED Triage Notes (Signed)
Patient c/o sinus pressure x 2 weeks.  Patient endorses headache at times.   Patient has taken OTC cough and cold medication w/ some relief of symptoms.     Patient c/o lower mid back and ABD pain x 1 month.   Patient endorses that pain may be associated with occupation.   Patient denies diarrhea and emesis.   Patient endorses some nausea.

## 2020-02-25 ENCOUNTER — Ambulatory Visit (HOSPITAL_COMMUNITY): Payer: Self-pay

## 2020-02-28 ENCOUNTER — Encounter (HOSPITAL_COMMUNITY): Payer: Self-pay

## 2020-02-28 ENCOUNTER — Other Ambulatory Visit: Payer: Self-pay

## 2020-02-28 ENCOUNTER — Ambulatory Visit (HOSPITAL_COMMUNITY)
Admission: RE | Admit: 2020-02-28 | Discharge: 2020-02-28 | Disposition: A | Discharge status: Home / Self Care | Payer: BC Managed Care – PPO | Source: Ambulatory Visit | Attending: Family Medicine | Admitting: Family Medicine

## 2020-02-28 VITALS — BP 123/85 | HR 104 | Temp 98.1°F | Resp 16

## 2020-02-28 DIAGNOSIS — M545 Low back pain, unspecified: Secondary | ICD-10-CM

## 2020-02-28 DIAGNOSIS — H9203 Otalgia, bilateral: Secondary | ICD-10-CM | POA: Diagnosis not present

## 2020-02-28 DIAGNOSIS — J3089 Other allergic rhinitis: Secondary | ICD-10-CM | POA: Diagnosis not present

## 2020-02-28 DIAGNOSIS — H6983 Other specified disorders of Eustachian tube, bilateral: Secondary | ICD-10-CM | POA: Diagnosis not present

## 2020-02-28 MED ORDER — PREDNISONE 20 MG PO TABS: 20.0000 mg | ORAL_TABLET | Freq: Two times a day (BID) | ORAL | 0 refills | Status: DC

## 2020-02-28 MED ORDER — TIZANIDINE HCL 4 MG PO TABS: 4.0000 mg | ORAL_TABLET | Freq: Four times a day (QID) | ORAL | 0 refills | Status: DC | PRN

## 2020-02-28 MED ORDER — IBUPROFEN 800 MG PO TABS: 800.0000 mg | ORAL_TABLET | Freq: Three times a day (TID) | ORAL | 0 refills | Status: DC

## 2020-02-28 NOTE — ED Triage Notes (Signed)
Pt reports ear pain for 3 weeks.

## 2020-02-28 NOTE — Discharge Instructions (Signed)
Take the prednisone 2 times a day.  This is for the ear pressure and pain Take ibuprofen 3 times a day with food.  This is for low back pain Take tizanidine as needed at bedtime.  This is a muscle relaxer.  Will help you sleep Follow-up with your usual doctor

## 2020-02-28 NOTE — ED Provider Notes (Signed)
[facility]    CSN: [id number] Arrival date & time: 02/28/20  1507      History   Chief Complaint Chief Complaint  Patient presents with  . Otalgia    RT/LT    HPI Jeffrey Chang is a 25 y.o. male.   HPI  Patient has allergic rhinitis.  He is using his Flonase.  In spite of this he has been having ear popping and pain. Still with some sinus pressure an d congestion No fever or chills He still complain of low back pain.  The Naprosyn did not help.  He states he is having trouble sleeping.  Pain is in his low back.  No numbness or weakness in the legs.  No injury  Past Medical History:  Diagnosis Date  . Bipolar 1 disorder (HCC)   . Manic depressive disorder [facility])     Patient Active Problem List   Diagnosis Date Noted  . Cannabis abuse with psychotic disorder, with delusions (HCC) 07/07/2016    History reviewed. No pertinent surgical history.     Home Medications    Prior to Admission medications   Medication Sig Start Date End Date Taking? Authorizing Provider  ibuprofen (ADVIL) 800 MG tablet Take 1 tablet (800 mg total) by mouth 3 (three) times daily. 02/28/20  Yes Eustace Moore, MD  tiZANidine (ZANAFLEX) 4 MG tablet Take 1-2 tablets (4-8 mg total) by mouth every 6 (six) hours as needed for muscle spasms. 02/28/20  Yes Eustace Moore, MD  cetirizine (ZYRTEC ALLERGY) 10 MG tablet Take 1 tablet (10 mg total) by mouth daily. 01/23/20   Hall-Potvin, [place], PA-C  fluticasone (FLONASE) 50 MCG/ACT nasal spray Place 1 spray into both nostrils daily. 01/23/20   Hall-Potvin, [place], PA-C  predniSONE (DELTASONE) 20 MG tablet Take 1 tablet (20 mg total) by mouth 2 (two) times daily with a meal. 02/28/20   Eustace Moore, MD    Family History History reviewed. No pertinent family history.  Social History Social History   Tobacco Use  . Smoking status: Current Every Day Smoker    Types: Cigars  . Smokeless tobacco: Never Used  Substance Use  Topics  . Alcohol use: No    Comment: occ     Allergies   Latex   Review of Systems Review of Systems  See HPI Physical Exam Triage Vital Signs ED Triage Vitals  Enc Vitals Group     BP 02/28/20 1608 123/85     Pulse Rate 02/28/20 1608 (!) 104     Resp 02/28/20 1608 16     Temp 02/28/20 1608 98.1 F (36.7 C)     Temp Source 02/28/20 1608 Oral     SpO2 02/28/20 1608 98 %     Weight --      Height --      Head Circumference --      Peak Flow --      Pain Score 02/28/20 1609 9     Pain Loc --      Pain Edu? --      Excl. in GC? --    No data found.  Updated Vital Signs BP 123/85 (BP Location: Left Arm)   Pulse (!) 104   Temp 98.1 F (36.7 C) (Oral)   Resp 16   SpO2 99%     Physical Exam Constitutional:      General: He is not in acute distress.    Appearance: Normal appearance. He is well-developed and well-nourished.  HENT:     Head: Normocephalic and atraumatic.     Right Ear: Tympanic membrane, ear canal and external ear normal.     Left Ear: Tympanic membrane, ear canal and external ear normal.     Ears:     Comments: Very mild injection TMs.    Nose: Nose normal. No congestion.     Mouth/Throat:     Mouth: Oropharynx is clear and moist. Mucous membranes are moist.  Eyes:     Conjunctiva/sclera: Conjunctivae normal.     Pupils: Pupils are equal, round, and reactive to light.  Cardiovascular:     Rate and Rhythm: Normal rate and regular rhythm.     Heart sounds: Normal heart sounds.  Pulmonary:     Effort: Pulmonary effort is normal. No respiratory distress.  Abdominal:     General: There is no distension.     Palpations: Abdomen is soft.  Musculoskeletal:        General: No edema. Normal range of motion.     Cervical back: Normal range of motion.     Comments: Mild tenderness across low back and SI joints.  No tenderness of the lumbar spine.  Mild tenderness of lumbar muscles.  Strength sensation range of motion and reflexes are normal in both  lower extremities.  Skin:    General: Skin is warm and dry.  Neurological:     Mental Status: He is alert. Mental status is at baseline.     Gait: Gait normal.     Deep Tendon Reflexes: Reflexes normal.  Psychiatric:        Behavior: Behavior normal.      UC Treatments / Results  Labs (all labs ordered are listed, but only abnormal results are displayed) Labs Reviewed - No data to display  EKG   Radiology No results found.  Procedures Procedures (including critical care time)  Medications Ordered in UC Medications - No data to display  Initial Impression / Assessment and Plan / UC Course  I have reviewed the triage vital signs and the nursing notes.  Pertinent labs & imaging results that were available during my care of the patient were reviewed by me and considered in my medical decision making (see chart for details).      Final Clinical Impressions(s) / UC Diagnoses   Final diagnoses:  Otalgia of both ears  Allergic rhinitis due to other allergic trigger, unspecified seasonality  Eustachian tube dysfunction, bilateral  Acute midline low back pain without sciatica     Discharge Instructions     Take the prednisone 2 times a day.  This is for the ear pressure and pain Take ibuprofen 3 times a day with food.  This is for low back pain Take tizanidine as needed at bedtime.  This is a muscle relaxer.  Will help you sleep Follow-up with your usual doctor   ED Prescriptions    Medication Sig Dispense Auth. Provider   predniSONE (DELTASONE) 20 MG tablet Take 1 tablet (20 mg total) by mouth 2 (two) times daily with a meal. 10 tablet Eustace Moore, MD   tiZANidine (ZANAFLEX) 4 MG tablet Take 1-2 tablets (4-8 mg total) by mouth every 6 (six) hours as needed for muscle spasms. 21 tablet Eustace Moore, MD   ibuprofen (ADVIL) 800 MG tablet Take 1 tablet (800 mg total) by mouth 3 (three) times daily. 21 tablet Eustace Moore, MD     PDMP not reviewed  this encounter.   Rica Mast  Fannie Knee, MD 02/28/20 1651

## 2020-03-13 ENCOUNTER — Ambulatory Visit
Admission: EM | Admit: 2020-03-13 | Discharge: 2020-03-13 | Disposition: A | Discharge status: Home / Self Care | Payer: BC Managed Care – PPO | Attending: Emergency Medicine | Admitting: Emergency Medicine

## 2020-03-13 ENCOUNTER — Other Ambulatory Visit: Payer: Self-pay

## 2020-03-13 DIAGNOSIS — R102 Pelvic and perineal pain: Secondary | ICD-10-CM

## 2020-03-13 DIAGNOSIS — R21 Rash and other nonspecific skin eruption: Secondary | ICD-10-CM

## 2020-03-13 LAB — POCT URINALYSIS DIP (MANUAL ENTRY)
Bilirubin, UA: NEGATIVE
Blood, UA: NEGATIVE
Glucose, UA: NEGATIVE mg/dL
Ketones, POC UA: NEGATIVE mg/dL
Nitrite, UA: NEGATIVE
Protein Ur, POC: NEGATIVE mg/dL
Spec Grav, UA: 1.015 (ref 1.010–1.025)
Urobilinogen, UA: 0.2 E.U./dL
pH, UA: 7 (ref 5.0–8.0)

## 2020-03-13 MED ORDER — CLOTRIMAZOLE-BETAMETHASONE 1-0.05 % EX CREA: TOPICAL_CREAM | CUTANEOUS | 0 refills | Status: DC

## 2020-03-13 MED ORDER — IBUPROFEN 800 MG PO TABS: 800.0000 mg | ORAL_TABLET | Freq: Three times a day (TID) | ORAL | 0 refills | Status: DC

## 2020-03-13 NOTE — ED Triage Notes (Signed)
Pt c/o urinating for frequently and urgency for the past few weeks. Denies burning on urination. Pt c/o irritation of skin to groin area and upper inner thighs. States using OTC creams for jock itch with no relief. Pt also c/o eczema rash to rt shoulder. Pt denies having un protective intercourse, no concerns of STD.

## 2020-03-13 NOTE — Discharge Instructions (Signed)
Urine culture and urethral swab pending Use ibuprofen and Tylenol for pain in the meantime May apply Lotrisone cream twice daily to groin and shoulder area Please monitor symptoms, follow-up if not improving or worsening

## 2020-03-13 NOTE — ED Provider Notes (Signed)
[name]    CSN: [id number] Arrival date & time: 03/13/20  1806      History   Chief Complaint Chief Complaint  Patient presents with  . Urinary Tract Infection    HPI Jeffrey Chang is a 25 y.o. male presenting today for evaluation of urinary urgency. Denies dysuria, denies penile discharge. Denies being sexually active currently. Initially thought he had jock itch. Reports slight darkening and redness to groin and penis. Patient is circumcised.   Reports flare of eczema to left shoulder  HPI  Past Medical History:  Diagnosis Date  . Bipolar 1 disorder (HCC)   . Manic depressive disorder [facility])     Patient Active Problem List   Diagnosis Date Noted  . Cannabis abuse with psychotic disorder, with delusions (HCC) 07/07/2016    History reviewed. No pertinent surgical history.     Home Medications    Prior to Admission medications   Medication Sig Start Date End Date Taking? Authorizing Provider  clotrimazole-betamethasone (LOTRISONE) cream Apply to affected area 2 times daily 03/13/20  Yes Rebekka Lobello C, PA-C  ibuprofen (ADVIL) 800 MG tablet Take 1 tablet (800 mg total) by mouth 3 (three) times daily. 03/13/20  Yes Alayia Meggison C, PA-C  cetirizine (ZYRTEC ALLERGY) 10 MG tablet Take 1 tablet (10 mg total) by mouth daily. 01/23/20   Hall-Potvin, [place], PA-C  fluticasone (FLONASE) 50 MCG/ACT nasal spray Place 1 spray into both nostrils daily. 01/23/20   Hall-Potvin, [place], PA-C    Family History History reviewed. No pertinent family history.  Social History Social History   Tobacco Use  . Smoking status: Current Every Day Smoker    Types: Cigars  . Smokeless tobacco: Never Used  Substance Use Topics  . Alcohol use: No    Comment: occ  . Drug use: Not Currently     Allergies   Latex   Review of Systems Review of Systems  Constitutional: Negative for fever.  HENT: Negative for sore throat.   Respiratory: Negative for shortness  of breath.   Cardiovascular: Negative for chest pain.  Gastrointestinal: Positive for abdominal pain. Negative for nausea and vomiting.  Genitourinary: Negative for difficulty urinating, dysuria, frequency, penile discharge, penile pain, penile swelling, scrotal swelling and testicular pain.  Skin: Negative for rash.  Neurological: Negative for dizziness, light-headedness and headaches.     Physical Exam Triage Vital Signs ED Triage Vitals  Enc Vitals Group     BP      Pulse      Resp      Temp      Temp src      SpO2      Weight      Height      Head Circumference      Peak Flow      Pain Score      Pain Loc      Pain Edu?      Excl. in GC?    No data found.  Updated Vital Signs BP (!) 160/89 (BP Location: Left Arm)   Pulse 81   Temp 98.1 F (36.7 C)   Resp 18   SpO2 98%   Visual Acuity Right Eye Distance:   Left Eye Distance:   Bilateral Distance:    Right Eye Near:   Left Eye Near:    Bilateral Near:     Physical Exam Vitals and nursing note reviewed.  Constitutional:      Appearance: He is well-developed and well-nourished.  Comments: No acute distress  HENT:     Head: Normocephalic and atraumatic.     Nose: Nose normal.  Eyes:     Conjunctiva/sclera: Conjunctivae normal.  Cardiovascular:     Rate and Rhythm: Normal rate.  Pulmonary:     Effort: Pulmonary effort is normal. No respiratory distress.  Abdominal:     General: There is no distension.     Comments: Mild lower abdominal and suprapubic tenderness to palpation bilaterally, negative rebound, negative Rovsing, negative McBurney's  Genitourinary:    Comments: Circumcised, no rashes lesions discoloration or erythema noted to shaft or glans of penis, no discharge or erythema noted within meatus, groin area without any obvious rash, slight hyperpigmentation noted within pubic area, no hyperpigmentation within the inguinal areas  Scrotum unremarkable Musculoskeletal:        General: Normal  range of motion.     Cervical back: Neck supple.  Skin:    General: Skin is warm and dry.     Comments: Left shoulder with hyperpigmented dry areas noted  Neurological:     Mental Status: He is alert and oriented to person, place, and time.  Psychiatric:        Mood and Affect: Mood and affect normal.      UC Treatments / Results  Labs (all labs ordered are listed, but only abnormal results are displayed) Labs Reviewed  POCT URINALYSIS DIP (MANUAL ENTRY) - Abnormal; Notable for the following components:      Result Value   Leukocytes, UA Trace (*)    All other components within normal limits  URINE CULTURE  CYTOLOGY, (ORAL, ANAL, URETHRAL) ANCILLARY ONLY    EKG   Radiology No results found.  Procedures Procedures (including critical care time)  Medications Ordered in UC Medications - No data to display  Initial Impression / Assessment and Plan / UC Course  I have reviewed the triage vital signs and the nursing notes.  Pertinent labs & imaging results that were available during my care of the patient were reviewed by me and considered in my medical decision making (see chart for details).     UA with trace leuks-we will send for culture to further rule out UTI, urethral swab pending for STD screening, but patient declines being currently sexually active.  No obvious rash noted on exam, does have eczema, opting to place on Lotrisone to help with any underlying fungal is eczematous causes of discomfort patient has been noting in the groin on skin.  Abdominal pain to treat with Tylenol and ibuprofen in the meantime close monitoring.  If culture and urethral swab negative and continuing to have changing or worsening symptoms to follow-up to reevaluate patient.  Discussed strict return precautions. Patient verbalized understanding and is agreeable with plan.  Final Clinical Impressions(s) / UC Diagnoses   Final diagnoses:  Suprapubic pressure  Rash and nonspecific skin  eruption     Discharge Instructions     Urine culture and urethral swab pending Use ibuprofen and Tylenol for pain in the meantime May apply Lotrisone cream twice daily to groin and shoulder area Please monitor symptoms, follow-up if not improving or worsening    ED Prescriptions    Medication Sig Dispense Auth. Provider   clotrimazole-betamethasone (LOTRISONE) cream Apply to affected area 2 times daily 15 g Trayonna Bachmeier C, PA-C   ibuprofen (ADVIL) 800 MG tablet Take 1 tablet (800 mg total) by mouth 3 (three) times daily. 21 tablet Chianne Byrns, [place] C, PA-C  PDMP not reviewed this encounter.   Lew Dawes, PA-C 03/14/20 1139

## 2020-03-15 LAB — URINE CULTURE: Culture: NO GROWTH

## 2020-03-15 LAB — CYTOLOGY, (ORAL, ANAL, URETHRAL) ANCILLARY ONLY
Chlamydia: NEGATIVE
Comment: NEGATIVE
Comment: NEGATIVE
Comment: NORMAL
Neisseria Gonorrhea: NEGATIVE
Trichomonas: POSITIVE — AB

## 2020-03-16 ENCOUNTER — Telehealth (HOSPITAL_COMMUNITY): Payer: Self-pay | Admitting: Emergency Medicine

## 2020-03-16 MED ORDER — METRONIDAZOLE 500 MG PO TABS: 500.0000 mg | ORAL_TABLET | Freq: Two times a day (BID) | ORAL | 0 refills | Status: DC

## 2020-05-27 ENCOUNTER — Ambulatory Visit
Admission: RE | Admit: 2020-05-27 | Discharge: 2020-05-27 | Disposition: A | Discharge status: Home / Self Care | Payer: BC Managed Care – PPO | Source: Ambulatory Visit | Attending: Emergency Medicine | Admitting: Emergency Medicine

## 2020-05-27 ENCOUNTER — Other Ambulatory Visit: Payer: Self-pay

## 2020-05-27 VITALS — BP 153/100 | HR 84 | Temp 98.6°F | Resp 18

## 2020-05-27 DIAGNOSIS — H6983 Other specified disorders of Eustachian tube, bilateral: Secondary | ICD-10-CM

## 2020-05-27 DIAGNOSIS — J302 Other seasonal allergic rhinitis: Secondary | ICD-10-CM | POA: Diagnosis not present

## 2020-05-27 MED ORDER — CETIRIZINE HCL 10 MG PO CAPS: 10.0000 mg | ORAL_CAPSULE | Freq: Every day | ORAL | 0 refills | Status: DC

## 2020-05-27 MED ORDER — PREDNISONE 20 MG PO TABS: 40.0000 mg | ORAL_TABLET | Freq: Every day | ORAL | 0 refills | Status: AC

## 2020-05-27 NOTE — Discharge Instructions (Signed)
Restart daily Flonase Daily cetirizine/Zyrtec Prednisone 40 mg daily for 5 days-take with food Follow-up if not improving or worsening

## 2020-05-27 NOTE — ED Triage Notes (Signed)
Pt present bilateral ear pain, symptoms started on Monday. Pt denies any drainage.

## 2020-05-27 NOTE — ED Provider Notes (Signed)
[name]    CSN: [id number] Arrival date & time: 05/27/20  1038      History   Chief Complaint Chief Complaint  Patient presents with  . Otalgia    Both ears    HPI Jeffrey Chang is a 25 y.o. male presenting today for evaluation of ear pain.  Reports bilateral ear pain pressure and throbbing sensation for approximately 1 week.  Has had associated nasal congestion.  Denies fevers.  History of similar in the past improving with prednisone.  HPI  Past Medical History:  Diagnosis Date  . Bipolar 1 disorder (HCC)   . Manic depressive disorder [facility])     Patient Active Problem List   Diagnosis Date Noted  . Cannabis abuse with psychotic disorder, with delusions (HCC) 07/07/2016    History reviewed. No pertinent surgical history.     Home Medications    Prior to Admission medications   Medication Sig Start Date End Date Taking? Authorizing Provider  Cetirizine HCl 10 MG CAPS Take 1 capsule (10 mg total) by mouth daily. 05/27/20  Yes Phoenyx Melka C, PA-C  predniSONE (DELTASONE) 20 MG tablet Take 2 tablets (40 mg total) by mouth daily with breakfast for 5 days. 05/27/20 06/01/20 Yes Kurtis Anastasia C, PA-C  clotrimazole-betamethasone (LOTRISONE) cream Apply to affected area 2 times daily 03/13/20   Joziah Dollins C, PA-C  fluticasone (FLONASE) 50 MCG/ACT nasal spray Place 1 spray into both nostrils daily. 01/23/20   Hall-Potvin, [place], PA-C  ibuprofen (ADVIL) 800 MG tablet Take 1 tablet (800 mg total) by mouth 3 (three) times daily. 03/13/20   Gurtaj Ruz C, PA-C  metroNIDAZOLE (FLAGYL) 500 MG tablet Take 1 tablet (500 mg total) by mouth 2 (two) times daily. 03/16/20   Lamptey, Britta Mccreedy, MD    Family History History reviewed. No pertinent family history.  Social History Social History   Tobacco Use  . Smoking status: Current Every Day Smoker    Types: Cigars  . Smokeless tobacco: Never Used  Substance Use Topics  . Alcohol use: No    Comment:  occ  . Drug use: Not Currently     Allergies   Latex   Review of Systems Review of Systems  Constitutional: Negative for activity change, appetite change, chills, fatigue and fever.  HENT: Positive for ear pain. Negative for congestion, rhinorrhea, sinus pressure, sore throat and trouble swallowing.   Eyes: Negative for discharge and redness.  Respiratory: Negative for cough, chest tightness and shortness of breath.   Cardiovascular: Negative for chest pain.  Gastrointestinal: Negative for abdominal pain, diarrhea, nausea and vomiting.  Musculoskeletal: Negative for myalgias.  Skin: Negative for rash.  Neurological: Negative for dizziness, light-headedness and headaches.     Physical Exam Triage Vital Signs ED Triage Vitals  Enc Vitals Group     BP      Pulse      Resp      Temp      Temp src      SpO2      Weight      Height      Head Circumference      Peak Flow      Pain Score      Pain Loc      Pain Edu?      Excl. in GC?    No data found.  Updated Vital Signs BP (!) 153/100 (BP Location: Left Arm)   Pulse 84   Temp 98.6 F (37 C) (Oral)  Resp 18   SpO2 97%   Visual Acuity Right Eye Distance:   Left Eye Distance:   Bilateral Distance:    Right Eye Near:   Left Eye Near:    Bilateral Near:     Physical Exam Vitals and nursing note reviewed.  Constitutional:      Appearance: He is well-developed.     Comments: No acute distress  HENT:     Head: Normocephalic and atraumatic.     Ears:     Comments: Bilateral TMs with mildly opaque effusion, good bony landmarks, good cone of light    Nose: Nose normal.  Eyes:     Conjunctiva/sclera: Conjunctivae normal.  Cardiovascular:     Rate and Rhythm: Normal rate.  Pulmonary:     Effort: Pulmonary effort is normal. No respiratory distress.  Abdominal:     General: There is no distension.  Musculoskeletal:        General: Normal range of motion.     Cervical back: Neck supple.  Skin:    General:  Skin is warm and dry.  Neurological:     Mental Status: He is alert and oriented to person, place, and time.      UC Treatments / Results  Labs (all labs ordered are listed, but only abnormal results are displayed) Labs Reviewed - No data to display  EKG   Radiology No results found.  Procedures Procedures (including critical care time)  Medications Ordered in UC Medications - No data to display  Initial Impression / Assessment and Plan / UC Course  I have reviewed the triage vital signs and the nursing notes.  Pertinent labs & imaging results that were available during my care of the patient were reviewed by me and considered in my medical decision making (see chart for details).     Treating for allergic rhinitis/eustachian tube dysfunction with Flonase, Zyrtec and prednisone.  Push fluids.  No sign of otitis media at this time.  Discussed strict return precautions. Patient verbalized understanding and is agreeable with plan.  Final Clinical Impressions(s) / UC Diagnoses   Final diagnoses:  Dysfunction of both eustachian tubes  Seasonal allergic rhinitis, unspecified trigger     Discharge Instructions     Restart daily Flonase Daily cetirizine/Zyrtec Prednisone 40 mg daily for 5 days-take with food Follow-up if not improving or worsening    ED Prescriptions    Medication Sig Dispense Auth. Provider   Cetirizine HCl 10 MG CAPS Take 1 capsule (10 mg total) by mouth daily. 15 capsule Jared Whorley C, PA-C   predniSONE (DELTASONE) 20 MG tablet Take 2 tablets (40 mg total) by mouth daily with breakfast for 5 days. 10 tablet Yannick Steuber, [place] C, PA-C     PDMP not reviewed this encounter.   Lew Dawes, PA-C 05/27/20 1139

## 2020-06-14 ENCOUNTER — Ambulatory Visit: Admit: 2020-06-14 | Disposition: A | Discharge status: Home / Self Care | Payer: BC Managed Care – PPO

## 2020-06-14 ENCOUNTER — Other Ambulatory Visit: Payer: Self-pay

## 2020-06-14 ENCOUNTER — Ambulatory Visit
Admission: EM | Admit: 2020-06-14 | Discharge: 2020-06-14 | Disposition: A | Discharge status: Home / Self Care | Payer: BC Managed Care – PPO

## 2020-06-14 NOTE — ED Notes (Signed)
Pt was called from the waiting room and on his cell phone no answer.

## 2020-06-18 ENCOUNTER — Ambulatory Visit: Payer: Self-pay

## 2020-06-18 ENCOUNTER — Encounter: Payer: Self-pay | Admitting: Emergency Medicine

## 2020-06-18 ENCOUNTER — Emergency Department (INDEPENDENT_AMBULATORY_CARE_PROVIDER_SITE_OTHER)
Admission: EM | Admit: 2020-06-18 | Discharge: 2020-06-18 | Disposition: A | Discharge status: Home / Self Care | Payer: BC Managed Care – PPO | Source: Home / Self Care

## 2020-06-18 DIAGNOSIS — L02411 Cutaneous abscess of right axilla: Secondary | ICD-10-CM | POA: Diagnosis not present

## 2020-06-18 DIAGNOSIS — L739 Follicular disorder, unspecified: Secondary | ICD-10-CM

## 2020-06-18 MED ORDER — IBUPROFEN 800 MG PO TABS: 800.0000 mg | ORAL_TABLET | Freq: Three times a day (TID) | ORAL | 0 refills | Status: DC

## 2020-06-18 MED ORDER — SULFAMETHOXAZOLE-TRIMETHOPRIM 800-160 MG PO TABS: 1.0000 | ORAL_TABLET | Freq: Two times a day (BID) | ORAL | 0 refills | Status: AC

## 2020-06-18 NOTE — ED Triage Notes (Signed)
Patient states that he has a lump under his right armpit x 2 weeks.  Patient has been applying heat, did take Ibuprofen w/o relief.

## 2020-06-18 NOTE — Discharge Instructions (Signed)
Advised/instructed patient to take medication with food to completion.  Advised/encourage patient increase daily water intake while taking this medication.  Advised patient may use prescribed Ibuprofen 2-3 times daily, PRN for right axilla abscess pain.

## 2020-06-18 NOTE — ED Provider Notes (Signed)
[facility]    CSN: [id number] Arrival date & time: 06/18/20  1600      History   Chief Complaint Chief Complaint  Patient presents with  . Abscess    HPI Jeffrey Chang is a 25 y.o. male.   HPI 25 year old male presents with cyst of right axilla for 2 weeks.  Reports applying heat and taking ibuprofen with little to no relief.  Past Medical History:  Diagnosis Date  . Bipolar 1 disorder (HCC)   . Manic depressive disorder [facility])     Patient Active Problem List   Diagnosis Date Noted  . Cannabis abuse with psychotic disorder, with delusions (HCC) 07/07/2016    History reviewed. No pertinent surgical history.     Home Medications    Prior to Admission medications   Medication Sig Start Date End Date Taking? Authorizing Provider  albuterol (VENTOLIN HFA) 108 (90 Base) MCG/ACT inhaler SMARTSIG:2 Puff(s) Via Inhaler Every 6 Hours 06/04/20  Yes [provider]  Cetirizine HCl 10 MG CAPS Take 1 capsule (10 mg total) by mouth daily. 05/27/20  Yes Wieters, Wadsworth C, PA-C  clotrimazole-betamethasone (LOTRISONE) cream Apply to affected area 2 times daily 03/13/20  Yes Wieters, Hallie C, PA-C  fluticasone (FLONASE) 50 MCG/ACT nasal spray Place 1 spray into both nostrils daily. 01/23/20  Yes Hall-Potvin, [place], PA-C  ibuprofen (ADVIL) 800 MG tablet Take 1 tablet (800 mg total) by mouth 3 (three) times daily. 06/18/20  Yes Trevor Iha, FNP  sulfamethoxazole-trimethoprim (BACTRIM DS) 800-160 MG tablet Take 1 tablet by mouth 2 (two) times daily for 10 days. 06/18/20 06/28/20 Yes Trevor Iha, FNP  metroNIDAZOLE (FLAGYL) 500 MG tablet Take 1 tablet (500 mg total) by mouth 2 (two) times daily. 03/16/20   Lamptey, Britta Mccreedy, MD    Family History History reviewed. No pertinent family history.  Social History Social History   Tobacco Use  . Smoking status: Current Every Day Smoker    Types: Cigars  . Smokeless tobacco: Never Used  Substance Use Topics  .  Alcohol use: No    Comment: occ  . Drug use: Not Currently     Allergies   Latex   Review of Systems Review of Systems  Constitutional: Negative.   HENT: Negative.   Eyes: Negative.   Respiratory: Negative.   Cardiovascular: Negative.   Gastrointestinal: Negative.   Genitourinary: Negative.   Musculoskeletal: Negative.   Skin: Positive for rash.  Neurological: Negative.      Physical Exam Triage Vital Signs ED Triage Vitals  Enc Vitals Group     BP 06/18/20 1647 (!) 147/82     Pulse Rate 06/18/20 1647 96     Resp --      Temp 06/18/20 1647 99.5 F (37.5 C)     Temp Source 06/18/20 1647 Oral     SpO2 06/18/20 1647 99 %     Weight --      Height --      Head Circumference --      Peak Flow --      Pain Score 06/18/20 1649 8     Pain Loc --      Pain Edu? --      Excl. in GC? --    No data found.  Updated Vital Signs BP (!) 147/82 (BP Location: Left Arm)   Pulse 96   Temp 99.5 F (37.5 C) (Oral)   SpO2 99%      Physical Exam Vitals and nursing note reviewed.  Constitutional:      Appearance: Normal appearance.  HENT:     Head: Normocephalic and atraumatic.     Mouth/Throat:     Mouth: Mucous membranes are moist.  Eyes:     Extraocular Movements: Extraocular movements intact.     Conjunctiva/sclera: Conjunctivae normal.     Pupils: Pupils are equal, round, and reactive to light.  Cardiovascular:     Rate and Rhythm: Normal rate and regular rhythm.     Pulses: Normal pulses.     Heart sounds: Normal heart sounds.  Pulmonary:     Effort: Pulmonary effort is normal.     Breath sounds: Normal breath sounds.  Musculoskeletal:     Cervical back: Normal range of motion and neck supple.  Skin:    General: Skin is warm and dry.     Comments: Right Axilla: ~3.0 cm x 1.0 cm oval shaped abscess, nonerythematous, nonindurated, nonfluctuant, no discharge/drainage, no lymphatic streaking  Neurological:     General: No focal deficit present.     Mental  Status: He is alert and oriented to person, place, and time.  Psychiatric:        Mood and Affect: Mood normal.        Behavior: Behavior normal.      UC Treatments / Results  Labs (all labs ordered are listed, but only abnormal results are displayed) Labs Reviewed - No data to display  EKG   Radiology No results found.  Procedures Procedures (including critical care time)  Medications Ordered in UC Medications - No data to display  Initial Impression / Assessment and Plan / UC Course  I have reviewed the triage vital signs and the nursing notes.  Pertinent labs & imaging results that were available during my care of the patient were reviewed by me and considered in my medical decision making (see chart for details).     MDM: 1. Abscess of right axilla, 2.  Folliculitis of right axilla.  Patient discharged home, hemodynamically stable. Final Clinical Impressions(s) / UC Diagnoses   Final diagnoses:  Abscess of axilla, right  Folliculitis of right axilla     Discharge Instructions     Advised/instructed patient to take medication with food to completion.  Advised/encourage patient increase daily water intake while taking this medication.  Advised patient may use prescribed Ibuprofen 2-3 times daily, PRN for right axilla abscess pain.    ED Prescriptions    Medication Sig Dispense Auth. Provider   sulfamethoxazole-trimethoprim (BACTRIM DS) 800-160 MG tablet Take 1 tablet by mouth 2 (two) times daily for 10 days. 20 tablet Trevor Iha, FNP   ibuprofen (ADVIL) 800 MG tablet Take 1 tablet (800 mg total) by mouth 3 (three) times daily. 21 tablet Trevor Iha, FNP     PDMP not reviewed this encounter.   Trevor Iha, FNP 06/18/20 1811

## 2020-08-23 ENCOUNTER — Ambulatory Visit: Payer: BC Managed Care – PPO

## 2020-11-01 ENCOUNTER — Telehealth: Payer: BC Managed Care – PPO | Admitting: Physician Assistant

## 2020-11-01 DIAGNOSIS — K644 Residual hemorrhoidal skin tags: Secondary | ICD-10-CM | POA: Diagnosis not present

## 2020-11-01 DIAGNOSIS — K5900 Constipation, unspecified: Secondary | ICD-10-CM

## 2020-11-01 MED ORDER — HYDROCORTISONE ACE-PRAMOXINE 2.5-1 % EX CREA: TOPICAL_CREAM | CUTANEOUS | 0 refills | Status: DC

## 2020-11-01 NOTE — Patient Instructions (Signed)
  Jeffrey Chang, thank you for joining Jeffrey Climes, [facility] for today's virtual visit.  While this provider is not your primary care provider (PCP), if your PCP is located in our provider database this encounter information will be shared with them immediately following your visit.  Consent: (Patient) Jeffrey Chang provided verbal consent for this virtual visit at the beginning of the encounter.  Current Medications:  Current Outpatient Medications:    albuterol (VENTOLIN HFA) 108 (90 Base) MCG/ACT inhaler, SMARTSIG:2 Puff(s) Via Inhaler Every 6 Hours, Disp: , Rfl:    Cetirizine HCl 10 MG CAPS, Take 1 capsule (10 mg total) by mouth daily., Disp: 15 capsule, Rfl: 0   clotrimazole-betamethasone (LOTRISONE) cream, Apply to affected area 2 times daily, Disp: 15 g, Rfl: 0   fluticasone (FLONASE) 50 MCG/ACT nasal spray, Place 1 spray into both nostrils daily., Disp: 16 g, Rfl: 0   ibuprofen (ADVIL) 800 MG tablet, Take 1 tablet (800 mg total) by mouth 3 (three) times daily., Disp: 21 tablet, Rfl: 0   metroNIDAZOLE (FLAGYL) 500 MG tablet, Take 1 tablet (500 mg total) by mouth 2 (two) times daily., Disp: 14 tablet, Rfl: 0   Medications ordered in this encounter:  No orders of the defined types were placed in this encounter.    *If you need refills on other medications prior to your next appointment, please contact your pharmacy*  Follow-Up: Call back or seek an in-person evaluation if the symptoms worsen or if the condition fails to improve as anticipated.  Other Instructions I encourage you to increase hydration and the amount of fiber in your diet.  Start a daily probiotic (Align, Culturelle, Digestive Advantage, etc.). If no bowel movement within 24 hours, take 2 Tbs of Milk of Magnesia in a 4 oz glass of warmed prune juice every 2-3 days to help promote bowel movement. If no results within 24 hours, then repeat above regimen, adding a Dulcolax stool softener to regimen.   Please use  the rectal cream as directed for hemorrhoids. Avoid sitting on toilet for prolonged periods of time. Keep well-hydrated. If not improving/resolving or anything worsens/new symptoms develop, you need an in-person evaluation.    If you have been instructed to have an in-person evaluation today at a local Urgent Care facility, please use the link below. It will take you to a list of all of our available [facility], including address, phone number and hours of operation. Please do not delay care.  [facility]  If you or a family member do not have a primary care provider, use the link below to schedule a visit and establish care. When you choose a [facility] primary care physician or advanced practice provider, you gain a long-term partner in health. Find a Primary Care Provider  Learn more about [facility] in-office and virtual care options: [facility] - Get Care Now

## 2020-11-01 NOTE — Progress Notes (Signed)
Virtual Visit Consent   [facility]rey Chang, you are scheduled for a virtual visit with a [facility] provider today.     Just as with appointments in the office, your consent must be obtained to participate.  Your consent will be active for this visit and any virtual visit you may have with one of our providers in the next 365 days.     If you have a MyChart account, a copy of this consent can be sent to you electronically.  All virtual visits are billed to your insurance company just like a traditional visit in the office.    As this is a virtual visit, video technology does not allow for your provider to perform a traditional examination.  This may limit your provider's ability to fully assess your condition.  If your provider identifies any concerns that need to be evaluated in person or the need to arrange testing (such as labs, EKG, etc.), we will make arrangements to do so.     Although advances in technology are sophisticated, we cannot ensure that it will always work on either your end or our end.  If the connection with a video visit is poor, the visit may have to be switched to a telephone visit.  With either a video or telephone visit, we are not always able to ensure that we have a secure connection.     I need to obtain your verbal consent now.   Are you willing to proceed with your visit today?    Wenceslaus Gist has provided verbal consent on 11/01/2020 for a virtual visit (video or telephone).   [name], [place]   Date: 11/01/2020 9:24 AM   Virtual Visit via Video Note   I, [name], connected with  Thoren Hosang  ([id number], Mar 23, 1995) on 11/01/20 at  9:15 AM EDT by a video-enabled telemedicine application and verified that I am speaking with the correct person using two identifiers.  Location: Patient: Virtual Visit Location Patient: Home Provider: Virtual Visit Location Provider: Home Office   I discussed the limitations of evaluation and management  by telemedicine and the availability of in person appointments. The patient expressed understanding and agreed to proceed.    History of Present Illness: Jeffrey Chang is a 25 y.o. who identifies as a male who was assigned male at birth, and is being seen today for hemorrhoid. Notes visualizing the hemorrhoids externally. Comes out with straining. BRBPR 2 x in the past few days. Some constipation with decreased stool output.   HPI: HPI  Problems:  Patient Active Problem List   Diagnosis Date Noted   Cannabis abuse with psychotic disorder, with delusions (HCC) 07/07/2016    Allergies:  Allergies  Allergen Reactions   Latex    Medications:  Current Outpatient Medications:    Pramoxine-HC (HYDROCORTISONE ACE-PRAMOXINE) 2.5-1 % CREA, 1 application twice daily for 6 days, Disp: 28.4 g, Rfl: 0  Observations/Objective: Patient is well-developed, well-nourished in no acute distress.  Resting comfortably at home.  Head is normocephalic, atraumatic.  No labored breathing. Speech is clear and coherent with logical content.  Patient is alert and oriented at baseline.   Assessment and Plan: 1. Constipation, unspecified constipation type Bowel regimen reviewed with patient to start as directed. If not improving needs in-person follow-up.  2. External hemorrhoid - Pramoxine-HC (HYDROCORTISONE ACE-PRAMOXINE) 2.5-1 % CREA; 1 application twice daily for 6 days  Dispense: 28.4 g; Refill: 0 2/2 #1. Bowel regimen as noted. Start Hydrocortisone-Pramoxine rectal cream twice  daily fior 6 days. If no improvement he is to be seen in person.  Follow Up Instructions: I discussed the assessment and treatment plan with the patient. The patient was provided an opportunity to ask questions and all were answered. The patient agreed with the plan and demonstrated an understanding of the instructions.  A copy of instructions were sent to the patient via MyChart.  The patient was advised to call back or seek an  in-person evaluation if the symptoms worsen or if the condition fails to improve as anticipated.  Time:  I spent 10 minutes with the patient via telehealth technology discussing the above problems/concerns.    [name], PA-C

## 2020-11-06 ENCOUNTER — Ambulatory Visit: Payer: BC Managed Care – PPO

## 2020-11-15 ENCOUNTER — Telehealth: Payer: BC Managed Care – PPO | Admitting: Physician Assistant

## 2020-11-15 DIAGNOSIS — K5900 Constipation, unspecified: Secondary | ICD-10-CM

## 2020-11-15 DIAGNOSIS — K644 Residual hemorrhoidal skin tags: Secondary | ICD-10-CM

## 2020-11-15 MED ORDER — HYDROCORTISONE ACETATE 25 MG RE SUPP: 25.0000 mg | Freq: Two times a day (BID) | RECTAL | 0 refills | Status: DC

## 2020-11-15 NOTE — Progress Notes (Addendum)
Virtual Visit Consent   Jeffrey Chang, you are scheduled for a virtual visit with a [facility] provider today.     Just as with appointments in the office, your consent must be obtained to participate.  Your consent will be active for this visit and any virtual visit you may have with one of our providers in the next 365 days.     If you have a MyChart account, a copy of this consent can be sent to you electronically.  All virtual visits are billed to your insurance company just like a traditional visit in the office.    As this is a virtual visit, video technology does not allow for your provider to perform a traditional examination.  This may limit your provider's ability to fully assess your condition.  If your provider identifies any concerns that need to be evaluated in person or the need to arrange testing (such as labs, EKG, etc.), we will make arrangements to do so.     Although advances in technology are sophisticated, we cannot ensure that it will always work on either your end or our end.  If the connection with a video visit is poor, the visit may have to be switched to a telephone visit.  With either a video or telephone visit, we are not always able to ensure that we have a secure connection.     I need to obtain your verbal consent now.   Are you willing to proceed with your visit today?    Jeffrey Chang has provided verbal consent on 11/15/2020 for a virtual visit (video or telephone).   Jeffrey Chang, [place]   Date: 11/15/2020 7:47 AM   Virtual Visit via Video Note   I, Jeffrey Chang, connected with  Jeffrey Chang  ([id number], 1995-07-24) on 11/15/20 at  7:30 AM EDT by a video-enabled telemedicine application and verified that I am speaking with the correct person using two identifiers.  Location: Patient: Virtual Visit Location Patient: Home Provider: Virtual Visit Location Provider: Home Office   I discussed the limitations of evaluation and management  by telemedicine and the availability of in person appointments. The patient expressed understanding and agreed to proceed.    History of Present Illness: [facility] Chang is a 25 y.o. who identifies as a male who was assigned male at birth, and is being seen today for follow-up of recent hemorrhoid treatment. Notes he has followed dietary recommendations and bowel regimen with substantial improvement in bowel habits and decrease in hard stool/straining. Notes he took the medication given as directed for 2 days with substantial improvement in hemorrhoid. States he misplaced the medication and was not able to get a refill so he did not complete course. Is wanting to see about getting a refill or similar medication to complete his course. Marland Kitchen   HPI: HPI  Problems:  Patient Active Problem List   Diagnosis Date Noted   Cannabis abuse with psychotic disorder, with delusions (HCC) 07/07/2016    Allergies:  Allergies  Allergen Reactions   Latex    Medications:  Current Outpatient Medications:    hydrocortisone (ANUSOL-HC) 25 MG suppository, Place 1 suppository (25 mg total) rectally 2 (two) times daily., Disp: 12 suppository, Rfl: 0  Observations/Objective: Patient is well-developed, well-nourished in no acute distress.  Resting comfortably at home.  Head is normocephalic, atraumatic.  No labored breathing. Speech is clear and coherent with logical content.  Patient is alert and oriented at baseline.   Assessment and Plan:  1. External hemorrhoid  2. Constipation, unspecified constipation type  Symptoms much improved with resolution of constipation. Was unable to complete course of steroid cream for the hemorrhoid. Still present although much improved. Noted insurance would not cover previous medication and had to pay out of pocket, unfortunately losing the tube of medication 2 days in. Reviewed formulary. Will send in hydrocortisone rectal supp as showing covered. Discussed if he has any  further issue with getting medication, he can use OTC Preparation H cream twice daily for a week. If not resolving, anything worsens or new symptoms develop, he needs in-person evaluation.   Follow Up Instructions: I discussed the assessment and treatment plan with the patient. The patient was provided an opportunity to ask questions and all were answered. The patient agreed with the plan and demonstrated an understanding of the instructions.  A copy of instructions were sent to the patient via MyChart unless otherwise noted below.   The patient was advised to call back or seek an in-person evaluation if the symptoms worsen or if the condition fails to improve as anticipated.  Time:  I spent 10 minutes with the patient via telehealth technology discussing the above problems/concerns.    Jeffrey Climes, PA-C

## 2020-11-15 NOTE — Patient Instructions (Addendum)
  [name], thank you for joining [name], PA-C for today's virtual visit.  While this provider is not your primary care provider (PCP), if your PCP is located in our provider database this encounter information will be shared with them immediately following your visit.  Consent: (Patient) Esiquio Boesen provided verbal consent for this virtual visit at the beginning of the encounter.  Current Medications:  Current Outpatient Medications:    hydrocortisone (ANUSOL-HC) 25 MG suppository, Place 1 suppository (25 mg total) rectally 2 (two) times daily., Disp: 12 suppository, Rfl: 0   Medications ordered in this encounter:  Meds ordered this encounter  Medications   hydrocortisone (ANUSOL-HC) 25 MG suppository    Sig: Place 1 suppository (25 mg total) rectally 2 (two) times daily.    Dispense:  12 suppository    Refill:  0    Order Specific Question:   Supervising Provider    Answer:   Hyacinth Meeker, BRIAN [[id number]]     *If you need refills on other medications prior to your next appointment, please contact your pharmacy*  Follow-Up: Call back or seek an in-person evaluation if the symptoms worsen or if the condition fails to improve as anticipated.  Other Instructions Continue good hydration and the bowel regimen discussed at last visit. I have sent in a new medication to use as directed for the hemorrhoid. If there is any issue with coverage, please start OTC Preparation H cream twice daily for up to a week.  If symptoms are not resolving, anything worsens or new symptoms develop, please be seen in person ASAP.    If you have been instructed to have an in-person evaluation today at a local Urgent Care facility, please use the link below. It will take you to a list of all of our available [facility], including address, phone number and hours of operation. Please do not delay care.  [facility]  If you or a family member do not have a primary care  provider, use the link below to schedule a visit and establish care. When you choose a [facility]  primary care physician or advanced practice provider, you gain a long-term partner in health. Find a Primary Care Provider  Learn more about [facility] in-office and virtual care options: [facility] - Get Care Now

## 2020-11-20 ENCOUNTER — Encounter: Payer: Self-pay | Admitting: Physician Assistant

## 2020-12-05 ENCOUNTER — Emergency Department (INDEPENDENT_AMBULATORY_CARE_PROVIDER_SITE_OTHER)
Admission: RE | Admit: 2020-12-05 | Discharge: 2020-12-05 | Disposition: A | Discharge status: Home / Self Care | Payer: BC Managed Care – PPO | Source: Ambulatory Visit

## 2020-12-05 ENCOUNTER — Other Ambulatory Visit: Payer: Self-pay

## 2020-12-05 VITALS — BP 145/87 | HR 76 | Temp 98.4°F | Resp 18 | Ht 72.0 in | Wt 190.0 lb

## 2020-12-05 DIAGNOSIS — L0591 Pilonidal cyst without abscess: Secondary | ICD-10-CM

## 2020-12-05 DIAGNOSIS — K4090 Unilateral inguinal hernia, without obstruction or gangrene, not specified as recurrent: Secondary | ICD-10-CM

## 2020-12-05 MED ORDER — SULFAMETHOXAZOLE-TRIMETHOPRIM 800-160 MG PO TABS: 1.0000 | ORAL_TABLET | Freq: Two times a day (BID) | ORAL | 0 refills | Status: AC

## 2020-12-05 MED ORDER — IBUPROFEN 800 MG PO TABS: 800.0000 mg | ORAL_TABLET | Freq: Three times a day (TID) | ORAL | 0 refills | Status: DC

## 2020-12-05 NOTE — ED Triage Notes (Signed)
Pilonidal cyst x 2 weeks Groin pain, after lifting something heavy Has been using warm compresses in both areas with some relief

## 2020-12-05 NOTE — Discharge Instructions (Addendum)
Advised patient to take medication as directed with food to completion.  Advised patient may take Ibuprofen 1-2 times daily, as needed for left inguinal pain.  Advised patient to establish care with PCP as an general surgery referral would be needed for left inguinal hernia. Encouraged patient to avoid heavy lifting for the next 7 to 10 days.  Encouraged patient increase daily water intake while taking this medication.

## 2020-12-05 NOTE — ED Provider Notes (Signed)
[facility]    CSN: [id number] Arrival date & time: 12/05/20  1519      History   Chief Complaint Chief Complaint  Patient presents with   Tailbone Pain   Abscess    HPI Jeffrey Chang is a 25 y.o. male.   HPI 25 year old male presents with pilonidal cyst for 2 weeks.  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 No pertinent surgical history.     Home Medications    Prior to Admission medications   Medication Sig Start Date End Date Taking? Authorizing Provider  ibuprofen (ADVIL) 800 MG tablet Take 1 tablet (800 mg total) by mouth 3 (three) times daily. 12/05/20  Yes Trevor Iha, FNP  sulfamethoxazole-trimethoprim (BACTRIM DS) 800-160 MG tablet Take 1 tablet by mouth 2 (two) times daily for 7 days. 12/05/20 12/12/20 Yes Trevor Iha, FNP  hydrocortisone (ANUSOL-HC) 25 MG suppository Place 1 suppository (25 mg total) rectally 2 (two) times daily. 11/15/20   Waldon Merl, PA-C    Family History History reviewed. No pertinent family history.  Social History Social History   Tobacco Use   Smoking status: Every Day    Types: Cigars   Smokeless tobacco: Never  Vaping Use   Vaping Use: Never used  Substance Use Topics   Alcohol use: Yes    Comment: occ   Drug use: Not Currently     Allergies   Latex   Review of Systems Review of Systems  Skin:  Positive for rash.    Physical Exam Triage Vital Signs ED Triage Vitals  Enc Vitals Group     BP      Pulse      Resp      Temp      Temp src      SpO2      Weight      Height      Head Circumference      Peak Flow      Pain Score      Pain Loc      Pain Edu?      Excl. in GC?    No data found.  Updated Vital Signs BP (!) 145/87 (BP Location: Left Arm)   Pulse 76    Temp 98.4 F (36.9 C) (Oral)   Resp 18   Ht 6' (1.829 m)   Wt 190 lb (86.2 kg)   SpO2 98%   BMI 25.77 kg/m    Physical Exam Vitals and nursing note reviewed.  Constitutional:      General: He is not in acute distress.    Appearance: Normal appearance. He is not ill-appearing.  HENT:     Head: Normocephalic and atraumatic.     Mouth/Throat:     Mouth: Mucous membranes are moist.     Pharynx: Oropharynx is clear.  Eyes:     Extraocular Movements: Extraocular movements intact.     Conjunctiva/sclera: Conjunctivae normal.     Pupils: Pupils are equal, round, and reactive to light.  Cardiovascular:     Rate and Rhythm: Normal rate and regular rhythm.     Pulses: Normal pulses.     Heart sounds: Normal heart sounds.  Pulmonary:     Effort: Pulmonary effort is normal.  Breath sounds: Normal breath sounds.  Abdominal:     Hernia: A hernia is present.     Comments: Left inguinal hernia noted, TTP, reducible  Musculoskeletal:        General: Normal range of motion.     Cervical back: Normal range of motion and neck supple.  Skin:    General: Skin is warm and dry.     Comments: Gluteal cleft (left-sided): Small < 0.5 cm circular shaped cutaneous cyst noted  Neurological:     General: No focal deficit present.     Mental Status: He is alert and oriented to person, place, and time.     UC Treatments / Results  Labs (all labs ordered are listed, but only abnormal results are displayed) Labs Reviewed - No data to display  EKG   Radiology No results found.  Procedures Procedures (including critical care time)  Medications Ordered in UC Medications - No data to display  Initial Impression / Assessment and Plan / UC Course  I have reviewed the triage vital signs and the nursing notes.  Pertinent labs & imaging results that were available during my care of the patient were reviewed by me and considered in my medical decision making (see chart for details).      MDM: 1.  Pilonidal cyst of natal cleft-Rx'd Bactrim; 2.  Left inguinal hernia-advised patient to establish care with PCP for possible general surgery referral for further evaluation. Advised patient may take Ibuprofen 1-2 times daily, as needed for left inguinal pain.  Advised patient to establish care with PCP as an general surgery referral would be needed for left inguinal hernia.  Avoid heavy lifting for the next 7 to 10 days.  Encouraged patient increase daily water intake while taking this medication.  Patient discharged home, hemodynamically stable. Final Clinical Impressions(s) / UC Diagnoses   Final diagnoses:  Pilonidal cyst of natal cleft  Left inguinal hernia     Discharge Instructions      Advised patient to take medication as directed with food to completion.  Advised patient may take Ibuprofen 1-2 times daily, as needed for left inguinal pain.  Advised patient to establish care with PCP as an general surgery referral would be needed for left inguinal hernia. Encouraged patient to avoid heavy lifting for the next 7 to 10 days.  Encouraged patient increase daily water intake while taking this medication.     ED Prescriptions     Medication Sig Dispense Auth. Provider   sulfamethoxazole-trimethoprim (BACTRIM DS) 800-160 MG tablet Take 1 tablet by mouth 2 (two) times daily for 7 days. 14 tablet Trevor Iha, FNP   ibuprofen (ADVIL) 800 MG tablet Take 1 tablet (800 mg total) by mouth 3 (three) times daily. 30 tablet Trevor Iha, FNP      PDMP not reviewed this encounter.   Trevor Iha, FNP 12/05/20 1624

## 2021-02-21 ENCOUNTER — Telehealth: Payer: BC Managed Care – PPO | Admitting: Emergency Medicine

## 2021-02-21 DIAGNOSIS — T7840XA Allergy, unspecified, initial encounter: Secondary | ICD-10-CM | POA: Diagnosis not present

## 2021-02-21 MED ORDER — PREDNISONE 20 MG PO TABS: 40.0000 mg | ORAL_TABLET | Freq: Every day | ORAL | 0 refills | Status: DC

## 2021-02-21 NOTE — Progress Notes (Signed)
Virtual Visit Consent   Jeffrey Chang, you are scheduled for a virtual visit with a [facility] provider today.     Just as with appointments in the office, your consent must be obtained to participate.  Your consent will be active for this visit and any virtual visit you may have with one of our providers in the next 365 days.     If you have a MyChart account, a copy of this consent can be sent to you electronically.  All virtual visits are billed to your insurance company just like a traditional visit in the office.    As this is a virtual visit, video technology does not allow for your provider to perform a traditional examination.  This may limit your provider's ability to fully assess your condition.  If your provider identifies any concerns that need to be evaluated in person or the need to arrange testing (such as labs, EKG, etc.), we will make arrangements to do so.     Although advances in technology are sophisticated, we cannot ensure that it will always work on either your end or our end.  If the connection with a video visit is poor, the visit may have to be switched to a telephone visit.  With either a video or telephone visit, we are not always able to ensure that we have a secure connection.     I need to obtain your verbal consent now.   Are you willing to proceed with your visit today?    Jeffrey Chang has provided verbal consent on 02/21/2021 for a virtual visit video.   Montine Circle, PA-C   Date: 02/21/2021 10:31 AM   Virtual Visit via Video Note   I, Montine Circle, connected with  Jeffrey Chang  ([id number], 05-Jul-1995) on 02/21/21 at 10:30 AM EST by a video-enabled telemedicine application and verified that I am speaking with the correct person using two identifiers.  Location: Patient: Virtual Visit Location Patient: Mobile Provider: Virtual Visit Location Provider: Home Office   I discussed the limitations of evaluation and management by telemedicine and  the availability of in person appointments. The patient expressed understanding and agreed to proceed.    History of Present Illness: Jeffrey Chang is a 26 y.o. who identifies as a male who was assigned male at birth, and is being seen today for allergic reaction.  He states he has a latex allergy and used a latex condom.  Reports itchy face and red face.  States that he has some itchy rash.  Denies any painful rash.  Denies discharge.  States that he is monogamous.  HPI: HPI  Problems:  Patient Active Problem List   Diagnosis Date Noted   Cannabis abuse with psychotic disorder, with delusions ([place]) 07/07/2016    Allergies:  Allergies  Allergen Reactions   Latex    Medications:  Current Outpatient Medications:    hydrocortisone (ANUSOL-HC) 25 MG suppository, Place 1 suppository (25 mg total) rectally 2 (two) times daily., Disp: 12 suppository, Rfl: 0   ibuprofen (ADVIL) 800 MG tablet, Take 1 tablet (800 mg total) by mouth 3 (three) times daily., Disp: 30 tablet, Rfl: 0  Observations/Objective: Patient is well-developed, well-nourished in no acute distress.  Resting comfortably in car.  Head is normocephalic, atraumatic.  No labored breathing.  Speech is clear and coherent with logical content.  Patient is alert and oriented at baseline.    Assessment and Plan: There are no diagnoses linked to this encounter. - Trial prednisone -  Doubt STI based on description, but he will followup if no improvements  Follow Up Instructions: I discussed the assessment and treatment plan with the patient. The patient was provided an opportunity to ask questions and all were answered. The patient agreed with the plan and demonstrated an understanding of the instructions.  A copy of instructions were sent to the patient via MyChart unless otherwise noted below.     The patient was advised to call back or seek an in-person evaluation if the symptoms worsen or if the condition fails to improve as  anticipated.  Time:  I spent 11 minutes with the patient via telehealth technology discussing the above problems/concerns.    Montine Circle, PA-C

## 2021-03-18 ENCOUNTER — Telehealth: Payer: BC Managed Care – PPO | Admitting: Nurse Practitioner

## 2021-03-18 DIAGNOSIS — J0110 Acute frontal sinusitis, unspecified: Secondary | ICD-10-CM

## 2021-03-18 DIAGNOSIS — B353 Tinea pedis: Secondary | ICD-10-CM

## 2021-03-18 MED ORDER — AMOXICILLIN-POT CLAVULANATE 875-125 MG PO TABS: 1.0000 | ORAL_TABLET | Freq: Two times a day (BID) | ORAL | 0 refills | Status: AC

## 2021-03-18 MED ORDER — FLUCONAZOLE 150 MG PO TABS: 150.0000 mg | ORAL_TABLET | Freq: Once | ORAL | 0 refills | Status: AC

## 2021-03-18 NOTE — Progress Notes (Signed)
Virtual Visit Consent   Jeffrey Chang, you are scheduled for a virtual visit with a [facility] provider today.     Just as with appointments in the office, your consent must be obtained to participate.  Your consent will be active for this visit and any virtual visit you may have with one of our providers in the next 365 days.     If you have a MyChart account, a copy of this consent can be sent to you electronically.  All virtual visits are billed to your insurance company just like a traditional visit in the office.    As this is a virtual visit, video technology does not allow for your provider to perform a traditional examination.  This may limit your provider's ability to fully assess your condition.  If your provider identifies any concerns that need to be evaluated in person or the need to arrange testing (such as labs, EKG, etc.), we will make arrangements to do so.     Although advances in technology are sophisticated, we cannot ensure that it will always work on either your end or our end.  If the connection with a video visit is poor, the visit may have to be switched to a telephone visit.  With either a video or telephone visit, we are not always able to ensure that we have a secure connection.     I need to obtain your verbal consent now.   Are you willing to proceed with your visit today?    Jeffrey Chang has provided verbal consent on 03/18/2021 for a virtual visit (video or telephone).   Apolonio Schneiders, FNP   Date: 03/18/2021 4:29 PM   Virtual Visit via Video Note   I, Apolonio Schneiders, connected with  Jeffrey Chang  ([id number], Aug 19, 1995) on 03/18/21 at  4:30 PM EST by a video-enabled telemedicine application and verified that I am speaking with the correct person using two identifiers.  Location: Patient: Virtual Visit Location Patient: Home Provider: Virtual Visit Location Provider: Home Office   I discussed the limitations of evaluation and management by telemedicine  and the availability of in person appointments. The patient expressed understanding and agreed to proceed.    History of Present Illness: Jeffrey Chang is a 26 y.o. who identifies as a male who was assigned male at birth, and is being seen today with complaints of nasal congestion, sinus pressure and pain in his face and mouth.   He has noticed that the inside of his nostrils are red and swollen  He has been taking Allegra D  Increased fluids as well   He has had a low grade fever associated with his illness   He has been sick for 1.5 weeks now.   He did initially take a COVID test and was negative  He has been vaccinated for COVID   He has had 2-3 sinus infections in the past   He has also been suffering from athletes foot. He wears steel toe shoes at work and his feet sweat often. He has been using OTC creams without relief. Also wears dry fit socks  Problems:  Patient Active Problem List   Diagnosis Date Noted   Cannabis abuse with psychotic disorder, with delusions ([place]) 07/07/2016    Allergies:  Allergies  Allergen Reactions   Latex    Medications:  Current Outpatient Medications:    hydrocortisone (ANUSOL-HC) 25 MG suppository, Place 1 suppository (25 mg total) rectally 2 (two) times daily., Disp: 12 suppository, Rfl:  0   ibuprofen (ADVIL) 800 MG tablet, Take 1 tablet (800 mg total) by mouth 3 (three) times daily., Disp: 30 tablet, Rfl: 0   predniSONE (DELTASONE) 20 MG tablet, Take 2 tablets (40 mg total) by mouth daily., Disp: 10 tablet, Rfl: 0  Observations/Objective: Patient is well-developed, well-nourished in no acute distress.  Resting comfortably  at home.  Head is normocephalic, atraumatic.  No labored breathing.  Speech is clear and coherent with logical content.  Patient is alert and oriented at baseline.    Assessment and Plan: 1. Acute non-recurrent frontal sinusitis Take antibiotic with food, continue Allegra D until symptoms improve  -  amoxicillin-clavulanate (AUGMENTIN) 875-125 MG tablet; Take 1 tablet by mouth 2 (two) times daily for 7 days.  Dispense: 14 tablet; Refill: 0  2. Tinea pedis of both feet Wear dry fit cotton socks Change often  Keep feet out of socks shoes as much as possible at home  May use OTC cream after shower at night   - fluconazole (DIFLUCAN) 150 MG tablet; Take 1 tablet (150 mg total) by mouth once for 1 dose.  Dispense: 1 tablet; Refill: 0     Follow Up Instructions: I discussed the assessment and treatment plan with the patient. The patient was provided an opportunity to ask questions and all were answered. The patient agreed with the plan and demonstrated an understanding of the instructions.  A copy of instructions were sent to the patient via MyChart unless otherwise noted below.    The patient was advised to call back or seek an in-person evaluation if the symptoms worsen or if the condition fails to improve as anticipated.  Time:  I spent 15 minutes with the patient via telehealth technology discussing the above problems/concerns.    Apolonio Schneiders, FNP

## 2021-04-05 ENCOUNTER — Other Ambulatory Visit: Payer: Self-pay

## 2021-04-05 ENCOUNTER — Ambulatory Visit
Admission: RE | Admit: 2021-04-05 | Discharge: 2021-04-05 | Disposition: A | Discharge status: Home / Self Care | Payer: BC Managed Care – PPO | Source: Ambulatory Visit | Attending: Chiropractic Medicine | Admitting: Chiropractic Medicine

## 2021-04-05 ENCOUNTER — Other Ambulatory Visit: Payer: Self-pay | Admitting: Chiropractic Medicine

## 2021-04-05 DIAGNOSIS — M542 Cervicalgia: Secondary | ICD-10-CM

## 2021-04-05 DIAGNOSIS — R200 Anesthesia of skin: Secondary | ICD-10-CM

## 2021-04-05 DIAGNOSIS — M549 Dorsalgia, unspecified: Secondary | ICD-10-CM

## 2021-05-08 ENCOUNTER — Telehealth: Payer: BC Managed Care – PPO | Admitting: Physician Assistant

## 2021-05-08 DIAGNOSIS — B356 Tinea cruris: Secondary | ICD-10-CM | POA: Diagnosis not present

## 2021-05-08 MED ORDER — TERBINAFINE HCL 250 MG PO TABS: 250.0000 mg | ORAL_TABLET | Freq: Every day | ORAL | 0 refills | Status: DC

## 2021-05-08 MED ORDER — CLOTRIMAZOLE 1 % EX CREA: 1.0000 "application " | TOPICAL_CREAM | Freq: Two times a day (BID) | CUTANEOUS | 0 refills | Status: DC

## 2021-05-08 NOTE — Progress Notes (Signed)
E-Visit for Jock Itch ? ?We are sorry that you are not feeling well. Here is how we plan to help! ? ?Based on what you shared with me it looks like you have tinea cruris, or ?Jock Itch?.  The symptoms of Jock Itch include red, peeling, itchy rash that affects the groin (crease where the leg meets the trunk).  This fungal infection can be spread through shared towels, clothing, bedding, or hard surfaces (particularly in moist areas) such as shower stalls, locker room floors, or pool area that has the fungus present. If you have a fungal infection on one part of your body, you can also spread it to other parts. For instance, men with a fungal infection on their feet sometimes spread it to their groin. ? ?I am recommending:Clotrimazole 1% cream or gel, apply to area twice per day ? ? ?Prescription medications are only indicated for an extensive rash or if over the counter treatments have failed. ? ?I am prescribing:Terbinafine 250 mg once per day for one week ? ?HOME CARE: ? ?Keep affected area clean, dry, and cool. ?Wash with soap and shampoo after sports or exercise and dry yourself well after bathing or swimming ?Wear cotton underwear and change them if they become damp or sweaty. ?Avoid using swimming pools, public showers, or baths. ? ?GET HELP RIGHT AWAY IF: ? ?Symptoms that don't away after treatment. ?Severe itching that persists. ?If your rash spreads or swells. ?If your rash begins to have drainage or smell. ?You develop a fever. ? ?MAKE SURE YOU  ? ?Understand these instructions. ?Will watch your condition. ?Will get help right away if you are not doing well or get worse. ? ?Thank you for choosing an e-visit. ? ?Your e-visit answers were reviewed by a board certified advanced clinical practitioner to complete your personal care plan. Depending upon the condition, your plan could have included both over the counter or prescription medications. ? ?Please review your pharmacy choice. Make sure the pharmacy is  open so you can pick up prescription now. If there is a problem, you may contact your provider through [employer] and have the prescription routed to another pharmacy.  Your safety is important to [place]. If you have drug allergies check your prescription carefully.  ? ?For the next 24 hours you can use MyChart to ask questions about today's visit, request a non-urgent call back, or ask for a work or school excuse. ?You will get an email in the next two days asking about your experience. I hope that your e-visit has been valuable and will speed your recovery. ? ? ?References or for more information: ? ?LoyaltyUs.is ?TruckOr.[email or web address] ?[email or web address].[email or web address]?search=jock%[email or web address]&source=search_result&selectedTitle=3~52&usage_type=default&display_rank=3 ? ? ?I provided 5 minutes of non face-to-face time during this encounter for chart review and documentation.  ? ?

## 2021-06-10 ENCOUNTER — Telehealth: Payer: BC Managed Care – PPO | Admitting: Physician Assistant

## 2021-06-10 ENCOUNTER — Telehealth: Payer: BC Managed Care – PPO

## 2021-06-10 DIAGNOSIS — H66002 Acute suppurative otitis media without spontaneous rupture of ear drum, left ear: Secondary | ICD-10-CM

## 2021-06-10 MED ORDER — NEOMYCIN-POLYMYXIN-HC 3.5-10000-1 OT SOLN: 3.0000 [drp] | Freq: Four times a day (QID) | OTIC | 0 refills | Status: DC

## 2021-06-10 MED ORDER — AMOXICILLIN 500 MG PO CAPS: 500.0000 mg | ORAL_CAPSULE | Freq: Two times a day (BID) | ORAL | 0 refills | Status: AC

## 2021-06-10 NOTE — Patient Instructions (Signed)
?[name], thank you for joining Margaretann Loveless, PA-C for today's virtual visit.  While this provider is not your primary care provider (PCP), if your PCP is located in our provider database this encounter information will be shared with them immediately following your visit. ? ?Consent: ?(Patient) Jeffrey Chang provided verbal consent for this virtual visit at the beginning of the encounter. ? ?Current Medications: ? ?Current Outpatient Medications:  ?  amoxicillin (AMOXIL) 500 MG capsule, Take 1 capsule (500 mg total) by mouth 2 (two) times daily for 10 days., Disp: 20 capsule, Rfl: 0 ?  neomycin-polymyxin-hydrocortisone (CORTISPORIN) OTIC solution, Place 3 drops into the left ear 4 (four) times daily. X 5-7 days, Disp: 10 mL, Rfl: 0 ?  clotrimazole (CLOTRIMAZOLE ANTI-FUNGAL) 1 % cream, Apply 1 application. topically 2 (two) times daily., Disp: 30 g, Rfl: 0 ?  terbinafine (LAMISIL) 250 MG tablet, Take 1 tablet (250 mg total) by mouth daily., Disp: 7 tablet, Rfl: 0  ? ?Medications ordered in this encounter:  ?Meds ordered this encounter  ?Medications  ? amoxicillin (AMOXIL) 500 MG capsule  ?  Sig: Take 1 capsule (500 mg total) by mouth 2 (two) times daily for 10 days.  ?  Dispense:  20 capsule  ?  Refill:  0  ?  Order Specific Question:   Supervising Provider  ?  Answer:   Eber Hong [[id number]]  ? neomycin-polymyxin-hydrocortisone (CORTISPORIN) OTIC solution  ?  Sig: Place 3 drops into the left ear 4 (four) times daily. X 5-7 days  ?  Dispense:  10 mL  ?  Refill:  0  ?  Order Specific Question:   Supervising Provider  ?  Answer:   Eber Hong [[id number]]  ?  ? ?*If you need refills on other medications prior to your next appointment, please contact your pharmacy* ? ?Follow-Up: ?Call back or seek an in-person evaluation if the symptoms worsen or if the condition fails to improve as anticipated. ? ?Other Instructions ?Otitis Media, Adult ? ?Otitis media occurs when there is inflammation and fluid in the  middle ear with signs and symptoms of an acute infection. The middle ear is a part of the ear that contains bones for hearing as well as air that helps send sounds to the brain. When infected fluid builds up in this space, it causes pressure and can lead to an ear infection. The eustachian tube connects the middle ear to the back of the nose (nasopharynx) and normally allows air into the middle ear. If the eustachian tube becomes blocked, fluid can build up and become infected. ?What are the causes? ?This condition is caused by a blockage in the eustachian tube. This can be caused by mucus or by swelling of the tube. Problems that can cause a blockage include: ?A cold or other upper respiratory infection. ?Allergies. ?An irritant, such as tobacco smoke. ?Enlarged adenoids. The adenoids are areas of soft tissue located high in the back of the throat, behind the nose and the roof of the mouth. They are part of the body's defense system (immune system). ?A mass in the nasopharynx. ?Damage to the ear caused by pressure changes (barotrauma). ?What increases the risk? ?You are more likely to develop this condition if you: ?Smoke or are exposed to tobacco smoke. ?Have an opening in the roof of your mouth (cleft palate). ?Have gastroesophageal reflux. ?Have an immune system disorder. ?What are the signs or symptoms? ?Symptoms of this condition include: ?Ear pain. ?Fever. ?Decreased hearing. ?Tiredness (lethargy). ?Fluid  leaking from the ear, if the eardrum is ruptured or has burst. ?Ringing in the ear. ?How is this diagnosed? ? ?This condition is diagnosed with a physical exam. During the exam, your health care provider will use an instrument called an otoscope to look in your ear and check for redness, swelling, and fluid. He or she will also ask about your symptoms. ?Your health care provider may also order tests, such as: ?A pneumatic otoscopy. This is a test to check the movement of the eardrum. It is done by squeezing  a small amount of air into the ear. ?A tympanogram. This is a test that shows how well the eardrum moves in response to air pressure in the ear canal. It provides a graph for your health care provider to review. ?How is this treated? ?This condition can go away on its own within 3-5 days. But if the condition is caused by a bacterial infection and does not go away on its own, or if it keeps coming back, your health care provider may: ?Prescribe antibiotic medicine to treat the infection. ?Prescribe or recommend medicines to control pain. ?Follow these instructions at home: ?Take over-the-counter and prescription medicines only as told by your health care provider. ?If you were prescribed an antibiotic medicine, take it as told by your health care provider. Do not stop taking the antibiotic even if you start to feel better. ?Keep all follow-up visits. This is important. ?Contact a health care provider if: ?You have bleeding from your nose. ?There is a lump on your neck. ?You are not feeling better in 5 days. ?You feel worse instead of better. ?Get help right away if: ?You have severe pain that is not controlled with medicine. ?You have swelling, redness, or pain around your ear. ?You have stiffness in your neck. ?A part of your face is not moving (paralyzed). ?The bone behind your ear (mastoid bone) is tender when you touch it. ?You develop a severe headache. ?Summary ?Otitis media is redness, soreness, and swelling of the middle ear, usually resulting in pain and decreased hearing. ?This condition can go away on its own within 3-5 days. ?If the problem does not go away in 3-5 days, your health care provider may give you medicines to treat the infection. ?If you were prescribed an antibiotic medicine, take it as told by your health care provider. ?Follow all instructions that were given to you by your health care provider. ?This information is not intended to replace advice given to you by your health care provider.  Make sure you discuss any questions you have with your health care provider. ?Document Revised: 05/07/2020 Document Reviewed: 05/07/2020 ?Elsevier Patient Education ? 2023 Elsevier Inc. ? ? ? ?If you have been instructed to have an in-person evaluation today at a local Urgent Care facility, please use the link below. It will take you to a list of all of our available [facility], including address, phone number and hours of operation. Please do not delay care.  ?[facility] ? ?If you or a family member do not have a primary care provider, use the link below to schedule a visit and establish care. When you choose a [facility] primary care physician or advanced practice provider, you gain a long-term partner in health. ?Find a Primary Care Provider ? ?Learn more about [facility]'s in-office and virtual care options: ?[facility] - Get Care Now ?

## 2021-06-10 NOTE — Progress Notes (Signed)
?Virtual Visit Consent  ? ?[name], you are scheduled for a virtual visit with a [facility] provider today.   ?  ?Just as with appointments in the office, your consent must be obtained to participate.  Your consent will be active for this visit and any virtual visit you may have with one of our providers in the next 365 days.   ?  ?If you have a MyChart account, a copy of this consent can be sent to you electronically.  All virtual visits are billed to your insurance company just like a traditional visit in the office.   ? ?As this is a virtual visit, video technology does not allow for your provider to perform a traditional examination.  This may limit your provider's ability to fully assess your condition.  If your provider identifies any concerns that need to be evaluated in person or the need to arrange testing (such as labs, EKG, etc.), we will make arrangements to do so.   ?  ?Although advances in technology are sophisticated, we cannot ensure that it will always work on either your end or our end.  If the connection with a video visit is poor, the visit may have to be switched to a telephone visit.  With either a video or telephone visit, we are not always able to ensure that we have a secure connection.    ? ?Also, by engaging in this virtual visit, you consent to the provision of healthcare. Additionally, you authorize for your insurance to be billed (if applicable) for the services provided during this visit.  ? ?I need to obtain your verbal consent now.   Are you willing to proceed with your visit today?  ?  ?Aleister Lady has provided verbal consent on 06/10/2021 for a virtual visit (video or telephone). ?  ?Margaretann Loveless, PA-C  ? ?Date: 06/10/2021 12:51 PM ? ? ?Virtual Visit via Video Note  ? ?Jeffrey Chang, connected with  Jeffrey Chang  ([id number], Apr 16, 1995) on 06/10/21 at 12:30 PM EDT by a video-enabled telemedicine application and verified that I am speaking with the  correct person using two identifiers. ? ?Location: ?Patient: Virtual Visit Location Patient: Mobile ?Provider: Virtual Visit Location Provider: Home Office ?  ?I discussed the limitations of evaluation and management by telemedicine and the availability of in person appointments. The patient expressed understanding and agreed to proceed.   ? ?History of Present Illness: ?Jeffrey Chang is a 26 y.o. who identifies as a male who was assigned male at birth, and is being seen today for otalgia. ? ?HPI: Otalgia  ?There is pain in the left ear. This is a new problem. The current episode started 1 to 4 weeks ago (2-3 weeks). The problem occurs constantly. The problem has been gradually worsening. Maximum temperature: subjective fevers. Associated symptoms include hearing loss (muffled), neck pain (left side from ear), rhinorrhea (on and off; improved with netti pot) and a sore throat (slight). Pertinent negatives include no coughing, ear discharge or headaches. Associated symptoms comments: Tinnitus in the left. He has tried NSAIDs (netti-pot) for the symptoms. The treatment provided no relief. There is no history of a chronic ear infection, hearing loss or a tympanostomy tube.   ? ? ?Problems:  ?Patient Active Problem List  ? Diagnosis Date Noted  ? Cannabis abuse with psychotic disorder, with delusions (HCC) 07/07/2016  ?  ?Allergies:  ?Allergies  ?Allergen Reactions  ? Latex   ? ?Medications:  ?Current Outpatient Medications:  ?  amoxicillin (AMOXIL) 500 MG capsule, Take 1 capsule (500 mg total) by mouth 2 (two) times daily for 10 days., Disp: 20 capsule, Rfl: 0 ?  neomycin-polymyxin-hydrocortisone (CORTISPORIN) OTIC solution, Place 3 drops into the left ear 4 (four) times daily. X 5-7 days, Disp: 10 mL, Rfl: 0 ?  clotrimazole (CLOTRIMAZOLE ANTI-FUNGAL) 1 % cream, Apply 1 application. topically 2 (two) times daily., Disp: 30 g, Rfl: 0 ?  terbinafine (LAMISIL) 250 MG tablet, Take 1 tablet (250 mg total) by mouth daily.,  Disp: 7 tablet, Rfl: 0 ? ?Observations/Objective: ?Patient is well-developed, well-nourished in no acute distress.  ?Resting comfortably at home.  ?Head is normocephalic, atraumatic.  ?No labored breathing.  ?Speech is clear and coherent with logical content.  ?Patient is alert and oriented at baseline.  ? ? ?Assessment and Plan: ?1. Non-recurrent acute suppurative otitis media of left ear without spontaneous rupture of tympanic membrane ?- amoxicillin (AMOXIL) 500 MG capsule; Take 1 capsule (500 mg total) by mouth 2 (two) times daily for 10 days.  Dispense: 20 capsule; Refill: 0 ?- neomycin-polymyxin-hydrocortisone (CORTISPORIN) OTIC solution; Place 3 drops into the left ear 4 (four) times daily. X 5-7 days  Dispense: 10 mL; Refill: 0 ? ?- Suspect Otitis media ?- Will Start Amoxicillin and Cortisporin ?- Warm compresses ?- Tylenol and Ibuprofen as needed for pain ?- Seek in person evaluation if symptoms are worsening or fail to improve ? ?Follow Up Instructions: ?I discussed the assessment and treatment plan with the patient. The patient was provided an opportunity to ask questions and all were answered. The patient agreed with the plan and demonstrated an understanding of the instructions.  A copy of instructions were sent to the patient via MyChart unless otherwise noted below.  ? ? ?The patient was advised to call back or seek an in-person evaluation if the symptoms worsen or if the condition fails to improve as anticipated. ? ?Time:  ?I spent 10 minutes with the patient via telehealth technology discussing the above problems/concerns.   ? ?Margaretann Loveless, PA-C ?

## 2021-09-02 ENCOUNTER — Telehealth: Payer: BC Managed Care – PPO | Admitting: Physician Assistant

## 2021-09-02 DIAGNOSIS — J069 Acute upper respiratory infection, unspecified: Secondary | ICD-10-CM | POA: Diagnosis not present

## 2021-09-02 MED ORDER — IPRATROPIUM BROMIDE 0.03 % NA SOLN: 2.0000 | Freq: Two times a day (BID) | NASAL | 0 refills | Status: DC

## 2021-09-02 MED ORDER — BENZONATATE 100 MG PO CAPS: 100.0000 mg | ORAL_CAPSULE | Freq: Three times a day (TID) | ORAL | 0 refills | Status: DC | PRN

## 2021-09-02 NOTE — Progress Notes (Signed)

## 2021-09-10 ENCOUNTER — Telehealth: Payer: BC Managed Care – PPO | Admitting: Physician Assistant

## 2021-09-10 DIAGNOSIS — R21 Rash and other nonspecific skin eruption: Secondary | ICD-10-CM

## 2021-09-10 DIAGNOSIS — R509 Fever, unspecified: Secondary | ICD-10-CM

## 2021-09-10 NOTE — Progress Notes (Signed)
Because this is an ongoing issue despite prior treatment and current flare is associated with mentioned fever, I feel your condition warrants further evaluation and I recommend that you be seen in a face to face visit.   NOTE: There will be NO CHARGE for this eVisit   If you are having a true medical emergency please call 911.      For an urgent face to face visit, [facility] has seven urgent care centers for your convenience:     [facility] at [facility] Directions [phone] [address] [place], [place] [address]    [facility] Health [facility] Center [facility]) Get Driving Directions [phone] [address] [place], [place] [address]  [facility] Health Urgent Care Center [facility]) Get Driving Directions [phone] [address] [place],  [place]  [address]  [facility] Health Urgent Care Center [facility] Directions  [phone] [place],  [place] [address]   [facility] Urgent Care at [facility] Get Driving Directions [phone] [address] [place], [place] [address]   [facility] Health Urgent Care at [facility] Get Driving Directions  [phone] [address] [place], [place] [address]   [facility] Health Urgent Care at [facility] Directions [phone] [address], [address] [place], [place] [address]  Your MyChart E-visit questionnaire answers were reviewed by a board certified advanced clinical practitioner to complete your personal care plan based on your specific symptoms.  Thank you for using e-Visits.

## 2021-11-19 ENCOUNTER — Telehealth: Payer: BC Managed Care – PPO | Admitting: Physician Assistant

## 2021-11-19 DIAGNOSIS — H9203 Otalgia, bilateral: Secondary | ICD-10-CM

## 2021-11-19 MED ORDER — FLUTICASONE PROPIONATE 50 MCG/ACT NA SUSP: 2.0000 | Freq: Every day | NASAL | 0 refills | Status: DC

## 2021-11-19 MED ORDER — AMOXICILLIN 500 MG PO CAPS: 500.0000 mg | ORAL_CAPSULE | Freq: Three times a day (TID) | ORAL | 0 refills | Status: AC

## 2021-11-19 NOTE — Progress Notes (Signed)
E Visit for Swimmer's Ear  We are sorry that you are not feeling well. Here is how we plan to help!  In your case, I am more concerned about possibility of a middle ear infection than external ear infection (otitis externa). I will send in an antibiotic to take as directed. I also will send in a nasal steroid spray (Flonase) to use daily to help with pressure in the inner ear.   Your symptoms should improve over the next 3 days and should resolve in about 7 days.   GET HELP RIGHT AWAY IF:  Fever is over 102.2 degrees. You develop progressive ear pain or hearing loss. Ear symptoms persist longer than 3 days after treatment.  MAKE SURE YOU:  Understand these instructions. Will watch your condition. Will get help right away if you are not doing well or get worse.  TO PREVENT SWIMMER'S EAR: Use a bathing cap or custom fitted swim molds to keep your ears dry. Towel off after swimming to dry your ears. Tilt your head or pull your earlobes to allow the water to escape your ear canal. If there is still water in your ears, consider using a hairdryer on the lowest setting.   Thank you for choosing an e-visit.  Your e-visit answers were reviewed by a board certified advanced clinical practitioner to complete your personal care plan. Depending upon the condition, your plan could have included both over the counter or prescription medications.  Please review your pharmacy choice. Make sure the pharmacy is open so you can pick up prescription now. If there is a problem, you may contact your provider through [employer] and have the prescription routed to another pharmacy.  Your safety is important to [place]. If you have drug allergies check your prescription carefully.   For the next 24 hours you can use MyChart to ask questions about today's visit, request a non-urgent call back, or ask for a work or school excuse. You will get an email in the next two days asking about your experience. I  hope that your e-visit has been valuable and will speed your recovery.

## 2021-11-19 NOTE — Progress Notes (Signed)
I have spent 5 minutes in review of e-visit questionnaire, review and updating patient chart, medical decision making and response to patient.   Marzell Allemand Cody Gerhart Ruggieri, PA-C    

## 2021-12-01 ENCOUNTER — Telehealth: Payer: BC Managed Care – PPO | Admitting: Nurse Practitioner

## 2021-12-01 DIAGNOSIS — K649 Unspecified hemorrhoids: Secondary | ICD-10-CM | POA: Diagnosis not present

## 2021-12-01 MED ORDER — HYDROCORTISONE (PERIANAL) 2.5 % EX CREA: 1.0000 | TOPICAL_CREAM | Freq: Two times a day (BID) | CUTANEOUS | 0 refills | Status: DC

## 2021-12-01 NOTE — Progress Notes (Signed)
I have spent 5 minutes in review of e-visit questionnaire, review and updating patient chart, medical decision making and response to patient.  ° °Elaf Clauson W Brayam Boeke, NP ° °  °

## 2021-12-01 NOTE — Progress Notes (Signed)
E-Visit for Hemorrhoid  We are sorry that you are not feeling well. We are here to help!  Hemorrhoids are swollen veins in the rectum. They can cause itching, bleeding, and pain. Hemorrhoids are very common.  In some cases, you can see or feel hemorrhoids around the outside of the rectum. In other cases, you cannot see them because they are hidden inside the rectum. Be patient - It can take months for this to improve or go away.   Hemorrhoids do not always cause symptoms. But when they do, symptoms can include: ?Itching of the skin around the anus ?Bleeding - Bleeding is usually painless. You might see bright red blood after using the toilet. ?Pain - If a blood clot forms inside a hemorrhoid, this can cause pain. It can also cause a lump that you might be able to feel.   What can I do to keep from getting more hemorrhoids? -- The most important thing you can do is to keep from getting constipated. You should have a bowel movement at least a few times a week. When you have a bowel movement, you also should not have to push too much. Plus, your bowel movements should not be too hard. Being constipated and having hard bowel movements can make hemorrhoids worse.   I have prescribed Topical Hydrocortisone ointment 2.5%.  Apply to area two times per day for 30 days  HOME CARE: Sitz Baths twice daily. Soak buttocks in 2 or 3 inches of warm water for 10 to 15 minutes. Do not add soap, bubble bath, or anything to the water. Stool softener such as Colace 100 mg twice daily AND Miralax 1 scoop daily until you have regular soft stools Over the counter Preparation H Tucks Pads Witch Hazel  Here are some steps you can take to avoid getting constipated or having hard stools:  ?Eat lots of fruits, vegetables, and other foods with fiber. Fiber helps to increase bowel movements. If you do not get enough fiber from your diet, you can take fiber supplements. These come in the form of powders, wafers, or  pills. Some examples are Metamucil, Citrucel, Benefiber and FiberCon. If you take a fiber supplement, be sure to read the label so you know how much to take. If you're not sure, ask your provider or nurse. ?Take medicines called "stool softeners" such as docusate sodium (sample brand names: Colace, Dulcolax). These medicines increase the number of bowel movements you have. They are safe to take and they can prevent problems later.  You should request a referral for a follow up evaluation with a Gastroenterologist (GI doctor) to evaluate this chronic and relapsing condition - even if it improves to see what further steps need to be taken. This is highly linked to chronic constipation and straining to have a bowel movement. It may require further treatment or surgical intervention.   GET HELP RIGHT AWAY IF: You develop severe pain You have heavy bleeding   FOLLOW UP WITH YOUR PRIMARY PROVIDER IF: If your symptoms do not improve within 10 days  MAKE SURE YOU  Understand these instructions. Will watch your condition. Will get help right away if you are not doing well or get worse.   Thank you for choosing an e-visit.  Your e-visit answers were reviewed by a board certified advanced clinical practitioner to complete your personal care plan. Depending upon the condition, your plan could have included both over the counter or prescription medications.  Please review your pharmacy choice. Make   sure the pharmacy is open so you can pick up prescription now. If there is a problem, you may contact your provider through MyChart messaging and have the prescription routed to another pharmacy.  Your safety is important to us. If you have drug allergies check your prescription carefully.   For the next 24 hours you can use MyChart to ask questions about today's visit, request a non-urgent call back, or ask for a work or school excuse. You will get an email in the next two days asking about your experience.  I hope that your e-visit has been valuable and will speed your recovery.  

## 2022-01-31 ENCOUNTER — Telehealth: Payer: Self-pay | Admitting: Emergency Medicine

## 2022-01-31 DIAGNOSIS — B9689 Other specified bacterial agents as the cause of diseases classified elsewhere: Secondary | ICD-10-CM

## 2022-01-31 DIAGNOSIS — J019 Acute sinusitis, unspecified: Secondary | ICD-10-CM

## 2022-01-31 MED ORDER — AMOXICILLIN-POT CLAVULANATE 875-125 MG PO TABS: 1.0000 | ORAL_TABLET | Freq: Two times a day (BID) | ORAL | 0 refills | Status: DC

## 2022-01-31 NOTE — Progress Notes (Signed)
Virtual Visit Consent   Trayce Chang, you are scheduled for a virtual visit with a Jeffrey Chang provider today. Just as with appointments in the office, your consent must be obtained to participate. Your consent will be active for this visit and any virtual visit you may have with one of our providers in the next 365 days. If you have a MyChart account, a copy of this consent can be sent to you electronically.  As this is a virtual visit, video technology does not allow for your provider to perform a traditional examination. This may limit your provider's ability to fully assess your condition. If your provider identifies any concerns that need to be evaluated in person or the need to arrange testing (such as labs, EKG, etc.), we will make arrangements to do so. Although advances in technology are sophisticated, we cannot ensure that it will always work on either your end or our end. If the connection with a video visit is poor, the visit may have to be switched to a telephone visit. With either a video or telephone visit, we are not always able to ensure that we have a secure connection.  By engaging in this virtual visit, you consent to the provision of healthcare and authorize for your insurance to be billed (if applicable) for the services provided during this visit. Depending on your insurance coverage, you may receive a charge related to this service.  I need to obtain your verbal consent now. Are you willing to proceed with your visit today? Clements Toro has provided verbal consent on 01/31/2022 for a virtual visit (video or telephone). [name], NP  Date: 01/31/2022 10:06 AM  Virtual Visit via Video Note   I, [name], connected with  Jeffrey Chang  ([id number], 1995/07/15) on 01/31/22 at 10:00 AM EST by a video-enabled telemedicine application and verified that I am speaking with the correct person using two identifiers.  Location: Patient: Virtual Visit Location Patient:  Other: in his parked car in [place]  Provider: Virtual Visit Location Provider: Home Office   I discussed the limitations of evaluation and management by telemedicine and the availability of in person appointments. The patient expressed understanding and agreed to proceed.    History of Present Illness: Jeffrey Chang is a 26 y.o. who identifies as a male who was assigned male at birth, and is being seen today for sore throat.  Patient reports he has had sore throat, nasal congestion, nasal drainage for over a week.  His throat really bothers him the most.  He does have postnasal drainage and is clearing his throat a lot.  He reports he is not coughing.  He does feel like the glands underneath his throat, under his jaw are swollen and painful.  His nasal drainage is thick and yellow.  He does not feel he is getting better.  He has been using over-the-counter cold medicines for some temporary relief.  HPI: HPI  Problems:  Patient Active Problem List   Diagnosis Date Noted   Cannabis abuse with psychotic disorder, with delusions (HCC) 07/07/2016    Allergies:  Allergies  Allergen Reactions   Latex    Medications:  Current Outpatient Medications:    amoxicillin-clavulanate (AUGMENTIN) 875-125 MG tablet, Take 1 tablet by mouth 2 (two) times daily., Disp: 14 tablet, Rfl: 0   benzonatate (TESSALON) 100 MG capsule, Take 1 capsule (100 mg total) by mouth 3 (three) times daily as needed., Disp: 30 capsule, Rfl: 0   clotrimazole (CLOTRIMAZOLE  ANTI-FUNGAL) 1 % cream, Apply 1 application. topically 2 (two) times daily., Disp: 30 g, Rfl: 0   fluticasone (FLONASE) 50 MCG/ACT nasal spray, Place 2 sprays into both nostrils daily., Disp: 16 g, Rfl: 0   hydrocortisone (ANUSOL-HC) 2.5 % rectal cream, Place 1 Application rectally 2 (two) times daily., Disp: 60 g, Rfl: 0   ipratropium (ATROVENT) 0.03 % nasal spray, Place 2 sprays into both nostrils every 12 (twelve) hours., Disp: 30 mL, Rfl: 0    neomycin-polymyxin-hydrocortisone (CORTISPORIN) OTIC solution, Place 3 drops into the left ear 4 (four) times daily. X 5-7 days, Disp: 10 mL, Rfl: 0   terbinafine (LAMISIL) 250 MG tablet, Take 1 tablet (250 mg total) by mouth daily., Disp: 7 tablet, Rfl: 0  Observations/Objective: Patient is well-developed, well-nourished in no acute distress.  Resting comfortably in his car parked in [place].  Head is normocephalic, atraumatic.  No labored breathing. Does sound congested on video Speech is clear and coherent with logical content.  Patient is alert and oriented at baseline.    Assessment and Plan: 1. Acute bacterial sinusitis  Continue OTC meds. Rx augmentin for sinusitis.   Follow Up Instructions: I discussed the assessment and treatment plan with the patient. The patient was provided an opportunity to ask questions and all were answered. The patient agreed with the plan and demonstrated an understanding of the instructions.  A copy of instructions were sent to the patient via MyChart unless otherwise noted below.   The patient was advised to call back or seek an in-person evaluation if the symptoms worsen or if the condition fails to improve as anticipated.  Time:  I spent 10 minutes with the patient via telehealth technology discussing the above problems/concerns.    Carvel Getting, NP

## 2022-01-31 NOTE — Patient Instructions (Signed)
  Jeffrey Chang, thank you for joining Jeffrey Parsons, NP for today's virtual visit.  While this provider is not your primary care provider (PCP), if your PCP is located in our provider database this encounter information will be shared with them immediately following your visit.   A [facility] MyChart account gives you access to today's visit and all your visits, tests, and labs performed at [facility] " click here if you don't have a [facility] MyChart account or go to [email or web address]  Consent: (Patient) Jeffrey Chang provided verbal consent for this virtual visit at the beginning of the encounter.  Current Medications:  Current Outpatient Medications:    amoxicillin-clavulanate (AUGMENTIN) 875-125 MG tablet, Take 1 tablet by mouth 2 (two) times daily., Disp: 14 tablet, Rfl: 0   benzonatate (TESSALON) 100 MG capsule, Take 1 capsule (100 mg total) by mouth 3 (three) times daily as needed., Disp: 30 capsule, Rfl: 0   clotrimazole (CLOTRIMAZOLE ANTI-FUNGAL) 1 % cream, Apply 1 application. topically 2 (two) times daily., Disp: 30 g, Rfl: 0   fluticasone (FLONASE) 50 MCG/ACT nasal spray, Place 2 sprays into both nostrils daily., Disp: 16 g, Rfl: 0   hydrocortisone (ANUSOL-HC) 2.5 % rectal cream, Place 1 Application rectally 2 (two) times daily., Disp: 60 g, Rfl: 0   ipratropium (ATROVENT) 0.03 % nasal spray, Place 2 sprays into both nostrils every 12 (twelve) hours., Disp: 30 mL, Rfl: 0   neomycin-polymyxin-hydrocortisone (CORTISPORIN) OTIC solution, Place 3 drops into the left ear 4 (four) times daily. X 5-7 days, Disp: 10 mL, Rfl: 0   terbinafine (LAMISIL) 250 MG tablet, Take 1 tablet (250 mg total) by mouth daily., Disp: 7 tablet, Rfl: 0   Medications ordered in this encounter:  Meds ordered this encounter  Medications   amoxicillin-clavulanate (AUGMENTIN) 875-125 MG tablet    Sig: Take 1 tablet by mouth 2 (two) times daily.    Dispense:  14 tablet    Refill:  0      *If you need refills on other medications prior to your next appointment, please contact your pharmacy*  Follow-Up: Call back or seek an in-person evaluation if the symptoms worsen or if the condition fails to improve as anticipated.  [facility] [phone]  Other Instructions Try taking Mucinex to help manage your congestion.  Mucinex helps thin out your congestion so it drains better.  Drink lots of liquids.  I also suggest using saline nasal spray several times a day also to help manage your congestion.  Finish all the antibiotics as prescribed.   If you have been instructed to have an in-person evaluation today at a local Urgent Care facility, please use the link below. It will take you to a list of all of our available [facility], including address, phone number and hours of operation. Please do not delay care.  [facility]  If you or a family member do not have a primary care provider, use the link below to schedule a visit and establish care. When you choose a [facility] primary care physician or advanced practice provider, you gain a long-term partner in health. Find a Primary Care Provider  Learn more about 's in-office and virtual care options:  - Get Care Now

## 2022-02-12 ENCOUNTER — Telehealth: Payer: Self-pay | Admitting: Family

## 2022-02-12 DIAGNOSIS — L0291 Cutaneous abscess, unspecified: Secondary | ICD-10-CM

## 2022-02-12 MED ORDER — SULFAMETHOXAZOLE-TRIMETHOPRIM 800-160 MG PO TABS
1.0000 | ORAL_TABLET | Freq: Two times a day (BID) | ORAL | 0 refills | Status: DC
Start: 2022-02-12 — End: 2022-03-08

## 2022-02-12 NOTE — Progress Notes (Signed)
Virtual Visit Consent   Jeffrey Chang, you are scheduled for a virtual visit with a [facility] provider today. Just as with appointments in the office, your consent must be obtained to participate. Your consent will be active for this visit and any virtual visit you may have with one of our providers in the next 365 days. If you have a MyChart account, a copy of this consent can be sent to you electronically.  As this is a virtual visit, video technology does not allow for your provider to perform a traditional examination. This may limit your provider's ability to fully assess your condition. If your provider identifies any concerns that need to be evaluated in person or the need to arrange testing (such as labs, EKG, etc.), we will make arrangements to do so. Although advances in technology are sophisticated, we cannot ensure that it will always work on either your end or our end. If the connection with a video visit is poor, the visit may have to be switched to a telephone visit. With either a video or telephone visit, we are not always able to ensure that we have a secure connection.  By engaging in this virtual visit, you consent to the provision of healthcare and authorize for your insurance to be billed (if applicable) for the services provided during this visit. Depending on your insurance coverage, you may receive a charge related to this service.  I need to obtain your verbal consent now. Are you willing to proceed with your visit today? Jeffrey Chang has provided verbal consent on 02/12/2022 for a virtual visit (video or telephone). Evelina Dun, FNP  Date: 02/12/2022 3:08 PM  Virtual Visit via Video Note   I, Evelina Dun, connected with  Jeffrey Chang  ([id number], 15-Mar-1995) on 02/12/22 at  3:00 PM EST by a video-enabled telemedicine application and verified that I am speaking with the correct person using two identifiers.  Location: Patient: Virtual Visit Location Patient:  car Provider: Virtual Visit Location Provider: Home Office   I discussed the limitations of evaluation and management by telemedicine and the availability of in person appointments. The patient expressed understanding and agreed to proceed.    History of Present Illness: Jeffrey Chang is a 27 y.o. who identifies as a male who was assigned male at birth, and is being seen today for abscess right axilla that he noticed a week ago. Reports area is tender, red, drainage yellow discharge. He has applied warm compresses with mild relief.   HPI: HPI  Problems:  Patient Active Problem List   Diagnosis Date Noted   Cannabis abuse with psychotic disorder, with delusions ([place]) 07/07/2016    Allergies:  Allergies  Allergen Reactions   Latex    Medications:  Current Outpatient Medications:    sulfamethoxazole-trimethoprim (BACTRIM DS) 800-160 MG tablet, Take 1 tablet by mouth 2 (two) times daily., Disp: 14 tablet, Rfl: 0   clotrimazole (CLOTRIMAZOLE ANTI-FUNGAL) 1 % cream, Apply 1 application. topically 2 (two) times daily., Disp: 30 g, Rfl: 0   fluticasone (FLONASE) 50 MCG/ACT nasal spray, Place 2 sprays into both nostrils daily., Disp: 16 g, Rfl: 0   hydrocortisone (ANUSOL-HC) 2.5 % rectal cream, Place 1 Application rectally 2 (two) times daily., Disp: 60 g, Rfl: 0   ipratropium (ATROVENT) 0.03 % nasal spray, Place 2 sprays into both nostrils every 12 (twelve) hours., Disp: 30 mL, Rfl: 0   neomycin-polymyxin-hydrocortisone (CORTISPORIN) OTIC solution, Place 3 drops into the left ear 4 (four) times daily. X  5-7 days, Disp: 10 mL, Rfl: 0   terbinafine (LAMISIL) 250 MG tablet, Take 1 tablet (250 mg total) by mouth daily., Disp: 7 tablet, Rfl: 0  Observations/Objective: Patient is well-developed, well-nourished in no acute distress.  Resting comfortably Head is normocephalic, atraumatic.  No labored breathing.  Speech is clear and coherent with logical content.  Patient is alert and oriented at  baseline.  Abscess under right axilla, erythemas and swollen  Assessment and Plan: 1. Abscess - sulfamethoxazole-trimethoprim (BACTRIM DS) 800-160 MG tablet; Take 1 tablet by mouth 2 (two) times daily.  Dispense: 14 tablet; Refill: 0  Keep clean and dry Warm compresses  Avoid picking  Follow up if symptoms worsen or do not improve   Follow Up Instructions: I discussed the assessment and treatment plan with the patient. The patient was provided an opportunity to ask questions and all were answered. The patient agreed with the plan and demonstrated an understanding of the instructions.  A copy of instructions were sent to the patient via MyChart unless otherwise noted below.    The patient was advised to call back or seek an in-person evaluation if the symptoms worsen or if the condition fails to improve as anticipated.  Time:  I spent 8 minutes with the patient via telehealth technology discussing the above problems/concerns.    Evelina Dun, FNP

## 2022-03-08 ENCOUNTER — Telehealth: Payer: Self-pay | Admitting: Physician Assistant

## 2022-03-08 DIAGNOSIS — L0591 Pilonidal cyst without abscess: Secondary | ICD-10-CM

## 2022-03-08 MED ORDER — SULFAMETHOXAZOLE-TRIMETHOPRIM 800-160 MG PO TABS: 1.0000 | ORAL_TABLET | Freq: Two times a day (BID) | ORAL | 0 refills | Status: DC

## 2022-03-08 NOTE — Patient Instructions (Addendum)
Jeffrey Chang, thank you for joining Kennieth Rad, PA-C for today's virtual visit.  While this provider is not your primary care provider (PCP), if your PCP is located in our provider database this encounter information will be shared with them immediately following your visit.   [facility] account gives you access to today's visit and all your visits, tests, and labs performed at [facility] " click here if you don't have a [facility] account or go to [email or web address]  Consent: (Patient) Jeffrey Chang provided verbal consent for this virtual visit at the beginning of the encounter.  Current Medications:  Current Outpatient Medications:    clotrimazole (CLOTRIMAZOLE ANTI-FUNGAL) 1 % cream, Apply 1 application. topically 2 (two) times daily., Disp: 30 g, Rfl: 0   fluticasone (FLONASE) 50 MCG/ACT nasal spray, Place 2 sprays into both nostrils daily., Disp: 16 g, Rfl: 0   hydrocortisone (ANUSOL-HC) 2.5 % rectal cream, Place 1 Application rectally 2 (two) times daily., Disp: 60 g, Rfl: 0   ipratropium (ATROVENT) 0.03 % nasal spray, Place 2 sprays into both nostrils every 12 (twelve) hours., Disp: 30 mL, Rfl: 0   neomycin-polymyxin-hydrocortisone (CORTISPORIN) OTIC solution, Place 3 drops into the left ear 4 (four) times daily. X 5-7 days, Disp: 10 mL, Rfl: 0   sulfamethoxazole-trimethoprim (BACTRIM DS) 800-160 MG tablet, Take 1 tablet by mouth 2 (two) times daily., Disp: 14 tablet, Rfl: 0   terbinafine (LAMISIL) 250 MG tablet, Take 1 tablet (250 mg total) by mouth daily., Disp: 7 tablet, Rfl: 0   Medications ordered in this encounter:  Meds ordered this encounter  Medications   sulfamethoxazole-trimethoprim (BACTRIM DS) 800-160 MG tablet    Sig: Take 1 tablet by mouth 2 (two) times daily.    Dispense:  14 tablet    Refill:  0    Order Specific Question:   Supervising Provider    Answer:   Chase Picket [id number]     *If you need refills on  other medications prior to your next appointment, please contact your pharmacy*  Follow-Up: Call back or seek an in-person evaluation if the symptoms worsen or if the condition fails to improve as anticipated.  [facility] [phone]  Other Instructions It is very important that you follow-up with a primary care provider to discuss being referred to a general surgeon for further evaluation.  Pilonidal Cyst  A pilonidal cyst is a fluid-filled sac that forms under the skin near the tailbone, at the top of the crease of the buttocks (pilonidal area). Cysts that are small and not infected may not cause any problems. Cysts that become irritated or infected may grow and fill with pus. An infected cyst is called an abscess. A pilonidal abscess may cause pain and swelling. It may need to be drained or removed. What are the causes? The cause of this condition is not always known. In some cases, it may be caused by a hair that grows into your skin (ingrown hair). What increases the risk? You are more likely to develop this condition if: You are male. You have lots of hair near the crease of the buttocks. You are overweight. You have a dimple near the crease of the buttocks. You wear tight clothing. You do not bathe or shower often. You sit for long periods of time. What are the signs or symptoms? Symptoms of this condition may include pain, swelling, redness, and warmth in the pilonidal area. You may also be able  to feel a lump near your tailbone if your cyst is big. If your cyst becomes infected, symptoms may include: Pus or fluid drainage. Fever. Pain, swelling, and redness getting worse. The lump getting bigger. How is this diagnosed? This condition may be diagnosed based on: Your symptoms and medical history. A physical exam. A blood test to check for infection. A test of a pus sample. How is this treated? You may not need any treatment if your cyst does not cause  symptoms. If your cyst bothers you or is infected, you may need a procedure to drain or remove the cyst. Depending on the size, location, and severity of your cyst, your health care provider may: Make an incision in the cyst and drain it (incision anddrainage). Open and drain the cyst, and then stitch the wound so that it stays open while it heals (marsupialization). You will be given instructions about how to care for your open wound while it heals. Remove all or part of the cyst, and then close the wound (cyst removal). You may need to take antibiotics before your procedure. Follow these instructions at home: Medicines Take over-the-counter and prescription medicines only as told by your health care provider. If you were prescribed antibiotics, take them as told by your health care provider. Do not stop taking the antibiotic even if you start to feel better. General instructions Keep the area around the cyst clean and dry. If there is fluid or pus draining from your cyst: Cover the area with a clean bandage (dressing). Wash the area gently with soap and water. Pat the area dry with a clean towel. Do not rub the area because that may cause bleeding. Remove hair from the area around the cyst only if your health care provider tells you to. Do not wear tight pants or sit in one position for long periods at a time. Contact a health care provider if: You have new redness, swelling, or pain. You have a fever. You have severe pain. Summary A pilonidal cyst is a fluid-filled sac that forms under the skin near the tailbone, at the top of the crease of the buttocks (pilonidal area). Cysts that become irritated or infected may grow and fill with pus. An infected cyst is called an abscess. The cause of this condition is not always known. In some cases, it may be caused by a hair that grows into your skin (ingrown hair). You may not need any treatment if your cyst does not cause symptoms. If your cyst  bothers you or is infected, you may need a procedure to drain or remove the cyst. This information is not intended to replace advice given to you by your health care provider. Make sure you discuss any questions you have with your health care provider. Document Revised: 04/24/2021 Document Reviewed: 04/24/2021 Elsevier Patient Education  [place].   If you have been instructed to have an in-person evaluation today at a local Urgent Care facility, please use the link below. It will take you to a list of all of our available [facility], including address, phone number and hours of operation. Please do not delay care.  [facility]  If you or a family member do not have a primary care provider, use the link below to schedule a visit and establish care. When you choose a [facility] primary care physician or advanced practice provider, you gain a long-term partner in health. Find a Primary Care Provider  Learn  more about [facility] in-office and virtual care options: [facility]

## 2022-03-08 NOTE — Progress Notes (Signed)
Virtual Visit Consent   Jeffrey Chang, you are scheduled for a virtual visit with a [facility] provider today. Just as with appointments in the office, your consent must be obtained to participate. Your consent will be active for this visit and any virtual visit you may have with one of our providers in the next 365 days. If you have a MyChart account, a copy of this consent can be sent to you electronically.  As this is a virtual visit, video technology does not allow for your provider to perform a traditional examination. This may limit your provider's ability to fully assess your condition. If your provider identifies any concerns that need to be evaluated in person or the need to arrange testing (such as labs, EKG, etc.), we will make arrangements to do so. Although advances in technology are sophisticated, we cannot ensure that it will always work on either your end or our end. If the connection with a video visit is poor, the visit may have to be switched to a telephone visit. With either a video or telephone visit, we are not always able to ensure that we have a secure connection.  By engaging in this virtual visit, you consent to the provision of healthcare and authorize for your insurance to be billed (if applicable) for the services provided during this visit. Depending on your insurance coverage, you may receive a charge related to this service.  I need to obtain your verbal consent now. Are you willing to proceed with your visit today? Melroy Bougher has provided verbal consent on 03/08/2022 for a virtual visit (video or telephone). Kennieth Rad, PA-C  Date: 03/08/2022 5:41 PM  Virtual Visit via Video Note   I, [name], connected with  Jeffrey Chang  ([id number], [birth year]) on 03/08/22 at  5:30 PM EST by a video-enabled telemedicine application and verified that I am speaking with the correct person using two identifiers.  Location: Patient: Virtual Visit Location Patient:  Home Provider: Virtual Visit Location Provider: Home Office   I discussed the limitations of evaluation and management by telemedicine and the availability of in person appointments. The patient expressed understanding and agreed to proceed.    History of Present Illness: Jeffrey Chang is a 27 y.o. who identifies as a male who was assigned male at birth, states that he has been having pain and tenderness in his "tailbone" area.  States that he has not seen any draining.  States that this started approximately 2 weeks ago.  States that he has been soaking with Epsom salts without relief.  Does endorse significant history of pilonidal cyst of natal cleft, was last seen for this in October 2022.   Denies measured fever.  Problems:  Patient Active Problem List   Diagnosis Date Noted   Cannabis abuse with psychotic disorder, with delusions ([place]) 07/07/2016    Allergies:  Allergies  Allergen Reactions   Latex    Medications:  Current Outpatient Medications:    clotrimazole (CLOTRIMAZOLE ANTI-FUNGAL) 1 % cream, Apply 1 application. topically 2 (two) times daily., Disp: 30 g, Rfl: 0   fluticasone (FLONASE) 50 MCG/ACT nasal spray, Place 2 sprays into both nostrils daily., Disp: 16 g, Rfl: 0   hydrocortisone (ANUSOL-HC) 2.5 % rectal cream, Place 1 Application rectally 2 (two) times daily., Disp: 60 g, Rfl: 0   ipratropium (ATROVENT) 0.03 % nasal spray, Place 2 sprays into both nostrils every 12 (twelve) hours., Disp: 30 mL, Rfl: 0   neomycin-polymyxin-hydrocortisone (CORTISPORIN) OTIC solution,  Place 3 drops into the left ear 4 (four) times daily. X 5-7 days, Disp: 10 mL, Rfl: 0   sulfamethoxazole-trimethoprim (BACTRIM DS) 800-160 MG tablet, Take 1 tablet by mouth 2 (two) times daily., Disp: 14 tablet, Rfl: 0   terbinafine (LAMISIL) 250 MG tablet, Take 1 tablet (250 mg total) by mouth daily., Disp: 7 tablet, Rfl: 0  Observations/Objective: Patient is well-developed, well-nourished in no  acute distress.  Resting comfortably  at home.  Head is normocephalic, atraumatic.  No labored breathing.  Speech is clear and coherent with logical content.  Patient is alert and oriented at baseline.    Assessment and Plan: 1. Pilonidal cyst of natal cleft - sulfamethoxazole-trimethoprim (BACTRIM DS) 800-160 MG tablet; Take 1 tablet by mouth 2 (two) times daily.  Dispense: 14 tablet; Refill: 0  Trial Bactrim.  Patient education given on supportive care.  Patient strongly encouraged to follow-up with primary care provider for referral to general surgery for further evaluation.  Patient understands and agrees.  Red flags given for prompt reevaluation.  Follow Up Instructions: I discussed the assessment and treatment plan with the patient. The patient was provided an opportunity to ask questions and all were answered. The patient agreed with the plan and demonstrated an understanding of the instructions.  A copy of instructions were sent to the patient via MyChart unless otherwise noted below.     The patient was advised to call back or seek an in-person evaluation if the symptoms worsen or if the condition fails to improve as anticipated.  Time:  I spent 15 minutes with the patient via telehealth technology discussing the above problems/concerns.    [name], PA-C

## 2022-03-13 ENCOUNTER — Encounter (HOSPITAL_BASED_OUTPATIENT_CLINIC_OR_DEPARTMENT_OTHER): Payer: Self-pay

## 2022-03-13 ENCOUNTER — Other Ambulatory Visit: Payer: Self-pay

## 2022-03-13 ENCOUNTER — Emergency Department (HOSPITAL_BASED_OUTPATIENT_CLINIC_OR_DEPARTMENT_OTHER)
Admission: EM | Admit: 2022-03-13 | Discharge: 2022-03-13 | Disposition: A | Discharge status: Home / Self Care | Payer: Self-pay | Attending: Emergency Medicine | Admitting: Emergency Medicine

## 2022-03-13 ENCOUNTER — Emergency Department (HOSPITAL_BASED_OUTPATIENT_CLINIC_OR_DEPARTMENT_OTHER): Payer: Self-pay | Admitting: Radiology

## 2022-03-13 DIAGNOSIS — M25512 Pain in left shoulder: Secondary | ICD-10-CM

## 2022-03-13 DIAGNOSIS — M545 Low back pain, unspecified: Secondary | ICD-10-CM

## 2022-03-13 DIAGNOSIS — Z91040 Latex allergy status: Secondary | ICD-10-CM | POA: Insufficient documentation

## 2022-03-13 MED ORDER — MELOXICAM 7.5 MG PO TABS: 7.5000 mg | ORAL_TABLET | Freq: Every day | ORAL | 0 refills | Status: DC

## 2022-03-13 MED ORDER — KETOROLAC TROMETHAMINE 30 MG/ML IJ SOLN
30.0000 mg | Freq: Once | INTRAMUSCULAR | Status: AC
  Administered 2022-03-13: 30 mg via INTRAMUSCULAR
  Filled 2022-03-13: qty 1

## 2022-03-13 MED ORDER — METHOCARBAMOL 500 MG PO TABS: 500.0000 mg | ORAL_TABLET | Freq: Two times a day (BID) | ORAL | 0 refills | Status: DC

## 2022-03-13 NOTE — ED Triage Notes (Signed)
Patient here POV from Home.  Endorses Left Shoulder/Lateral neck Pain that began 3 Weeks ago. No known injury or Trauma but states he believes he overuses it due to [employer]. Also states his Lower Back is "Swollen" for approximately 4-5 Months.   NAD noted during Triage. A&Ox4. GCS 15. Ambulatory.

## 2022-03-13 NOTE — Discharge Instructions (Addendum)
Thank you for allowing me to be part of your care today.  I have sent over 2 prescriptions to your pharmacy.  One is a prescription NSAID to take daily (do not take ibuprofen or Aleve while on this medication).  The other is a muscle relaxer to use mainly at night time or any time you can be at home (do not drive or operate machinery while taking this medication).   Please call [facility] for outpatient follow-up regarding your shoulder pain and low back pain.

## 2022-03-13 NOTE — ED Provider Notes (Signed)
[facility] Provider Note   CSN: [id number] Arrival date & time: 03/13/22  1700     History  Chief Complaint  Patient presents with   Shoulder Pain    Jeffrey Chang is a 27 y.o. male presents to the ED complaining of left shoulder pain and lower back pain that has been going on for the past few weeks.  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  Denies any known injury.  He was seen by video visit for his "swollen" feeling lower back and was given antibiotics to cover for a pilonidal cyst.  Denies neck pain, neck stiffness, numbness or weakness.        Home Medications Prior to Admission medications   Medication Sig Start Date End Date Taking? Authorizing Provider  meloxicam (MOBIC) 7.5 MG tablet Take 1 tablet (7.5 mg total) by mouth daily. 03/13/22  Yes Minola Guin R, PA  methocarbamol (ROBAXIN) 500 MG tablet Take 1 tablet (500 mg total) by mouth 2 (two) times daily. 03/13/22  Yes Hawken Bielby R, PA  clotrimazole (CLOTRIMAZOLE ANTI-FUNGAL) 1 % cream Apply 1 application. topically 2 (two) times daily. 05/08/21   Mar Daring, PA-C  fluticasone (FLONASE) 50 MCG/ACT nasal spray Place 2 sprays into both nostrils daily. 11/19/21   Brunetta Jeans, PA-C  hydrocortisone (ANUSOL-HC) 2.5 % rectal cream Place 1 Application rectally 2 (two) times daily. 12/01/21   Gildardo Pounds, NP  ipratropium (ATROVENT) 0.03 % nasal spray Place 2 sprays into both nostrils every 12 (twelve) hours. 09/02/21   Mar Daring, PA-C  neomycin-polymyxin-hydrocortisone (CORTISPORIN) OTIC solution Place 3 drops into the left ear 4 (four) times daily. X 5-7 days 06/10/21   Mar Daring, PA-C  sulfamethoxazole-trimethoprim (BACTRIM DS) 800-160 MG tablet Take 1 tablet by mouth 2 (two) times daily. 03/08/22   Mayers, Cari S, PA-C  terbinafine (LAMISIL) 250 MG tablet Take 1 tablet (250 mg total) by mouth daily. 05/08/21    Mar Daring, PA-C      Allergies    Latex    Review of Systems   Review of Systems  Musculoskeletal:  Positive for back pain and joint swelling (left shoulder swelling and pain). Negative for neck pain and neck stiffness.  Neurological:  Negative for weakness and numbness.    Physical Exam Updated Vital Signs BP (!) 142/101 (BP Location: Right Arm)   Pulse 95   Temp 98.3 F (36.8 C) (Temporal)   Resp 18   Ht 6' (1.829 m)   Wt 86.2 kg   SpO2 100%   BMI 25.77 kg/m  Physical Exam Vitals and nursing note reviewed.  Constitutional:      General: He is not in acute distress.    Appearance: Normal appearance. He is not ill-appearing or diaphoretic.  Cardiovascular:     Rate and Rhythm: Normal rate and regular rhythm.     Pulses: Normal pulses.  Pulmonary:     Effort: Pulmonary effort is normal.  Musculoskeletal:     Left shoulder: Tenderness present. No swelling, deformity, bony tenderness or crepitus. Normal range of motion. Normal pulse.     Lumbar back: Tenderness and bony tenderness present. No swelling or spasms. Normal range of motion.     Comments: Pain with empty can test and forward flexion of the left arm/shoulder.   Neurological:     Mental Status: He is alert. Mental status is at baseline.  Psychiatric:        Mood and Affect: Mood normal.        Behavior: Behavior normal.     ED Results / Procedures / Treatments   Labs (all labs ordered are listed, but only abnormal results are displayed) Labs Reviewed - No data to display  EKG None  Radiology DG Lumbar Spine Complete  Result Date: 03/13/2022 CLINICAL DATA:  Low back pain EXAM: LUMBAR SPINE - COMPLETE 4 VIEW COMPARISON:  None Available. FINDINGS: There is no evidence of lumbar spine fracture. Alignment is normal. Intervertebral disc spaces are maintained. IMPRESSION: Negative. Electronically Signed   By: Yetta Glassman M.D.   On: 03/13/2022 18:47   DG Shoulder Left  Result Date:  03/13/2022 CLINICAL DATA:  Shoulder pain and back pain EXAM: LEFT SHOULDER - 2+ VIEW COMPARISON:  None Available. FINDINGS: There is no evidence of fracture or dislocation. There is no evidence of arthropathy or other focal bone abnormality. Soft tissues are unremarkable. IMPRESSION: Negative. Electronically Signed   By: Ronney Asters M.D.   On: 03/13/2022 18:27    Procedures Procedures    Medications Ordered in ED Medications  ketorolac (TORADOL) 30 MG/ML injection 30 mg (has no administration in time range)    ED Course/ Medical Decision Making/ A&P                             Medical Decision Making Amount and/or Complexity of Data Reviewed Radiology: ordered.  Risk Prescription drug management.   This patient presents to the ED with chief complaint(s) of left shoulder pain and lower back pain with pertinent past medical history of cannabis abuse, MDD, bipolar 1.  The complaint involves an extensive differential diagnosis and also carries with it a high risk of complications and morbidity.    The differential diagnosis includes muscle strain or sprain, rotator cuff tear, joint degeneration, disc degeneration of the spine   The initial plan is to obtain plain films of left shoulder and lumbar spine   Additional history obtained: Additional history obtained from  none Records reviewed  none  Initial Assessment:   Exam significant for tenderness to palpation of the posterior shoulder.  He has pain with forward flexion of the arm and with the empty can test.  No appreciable crepitus or deformity.  There is tenderness to palpation of the trapezius muscle over Lumbar spine tender to palpation, there is also tenderness to palpation of paraspinal muscles in the lumbar region.    Independent ECG/labs interpretation:  The following labs were independently interpreted:  Not indicated  Independent visualization and interpretation of imaging: I independently visualized the following  imaging with scope of interpretation limited to determining acute life threatening conditions related to emergency care: x-ray of lumbar spine and left shoulder, which revealed no evidence of bony abnormality, joint spaces are maintained.  I agree with radiologist interpretation.    Treatment and Reassessment: Patient's musculoskeletal pain treated in ED with IM toradol.  Will send patient home on short prescription of prescription NSAIDs and muscle relaxer.  Patient also provided with referral to orthopedics for follow-up as more advanced imaging (such as MRI may be warranted).   Disposition:   The patient has been appropriately medically screened and/or stabilized in the ED. I have low suspicion for any other emergent medical condition which would require further screening, evaluation or treatment in the ED or require inpatient management. At time of discharge the patient is  hemodynamically stable and in no acute distress. I have discussed work-up results and diagnosis with patient and answered all questions. Patient is agreeable with discharge plan. We discussed strict return precautions for returning to the emergency department and they verbalized understanding.            Final Clinical Impression(s) / ED Diagnoses Final diagnoses:  Acute pain of left shoulder  Acute midline low back pain without sciatica    Rx / DC Orders ED Discharge Orders          Ordered    meloxicam (MOBIC) 7.5 MG tablet  Daily        03/13/22 1909    methocarbamol (ROBAXIN) 500 MG tablet  2 times daily        03/13/22 1909              Pat Kocher, [place] 03/13/22 1909    Malvin Johns, MD 03/13/22 2000

## 2022-03-17 ENCOUNTER — Telehealth: Payer: Self-pay | Admitting: Physician Assistant

## 2022-03-17 DIAGNOSIS — H00012 Hordeolum externum right lower eyelid: Secondary | ICD-10-CM

## 2022-03-17 MED ORDER — ERYTHROMYCIN 5 MG/GM OP OINT: 1.0000 | TOPICAL_OINTMENT | Freq: Two times a day (BID) | OPHTHALMIC | 0 refills | Status: DC

## 2022-03-17 NOTE — Progress Notes (Signed)
  E-Visit for Stye   We are sorry that you are not feeling well. Here is how we plan to help!  Based on what you have shared with me it looks like you have a stye.  A stye is an inflammation of the eyelid.  It is often a red, painful lump near the edge of the eyelid that may look like a boil or a pimple.  A stye develops when an infection occurs at the base of an eyelash.   We have made appropriate suggestions for you based upon your presentation: Simple styes can be treated without medical intervention.  Most styes either resolve spontaneously or resolve with simple home treatment by applying warm compresses or heated washcloth to the stye for about 10-15 minutes three to four times a day. This causes the stye to drain and resolve.  I will prescribe erythromycin ointment. Apply a thin layer to the right lower lid twice daily for 5 days.   HOME CARE:  Wash your hands often! Let the stye open on its own. Don't squeeze or open it. Don't rub your eyes. This can irritate your eyes and let in bacteria.  If you need to touch your eyes, wash your hands first. Don't wear eye makeup or contact lenses until the area has healed.  GET HELP RIGHT AWAY IF:  Your symptoms do not improve. You develop blurred or loss of vision. Your symptoms worsen (increased discharge, pain or redness).   Thank you for choosing an e-visit.  Your e-visit answers were reviewed by a board certified advanced clinical practitioner to complete your personal care plan. Depending upon the condition, your plan could have included both over the counter or prescription medications.  Please review your pharmacy choice. Make sure the pharmacy is open so you can pick up prescription now. If there is a problem, you may contact your provider through [employer] and have the prescription routed to another pharmacy.  Your safety is important to [place]. If you have drug allergies check your prescription carefully.   For the next 24  hours you can use MyChart to ask questions about today's visit, request a non-urgent call back, or ask for a work or school excuse. You will get an email in the next two days asking about your experience. I hope that your e-visit has been valuable and will speed your recovery.  I have spent 5 minutes in review of e-visit questionnaire, review and updating patient chart, medical decision making and response to patient.   Mar Daring, PA-C

## 2022-03-20 ENCOUNTER — Telehealth: Payer: Self-pay | Admitting: Nurse Practitioner

## 2022-03-20 DIAGNOSIS — T7840XA Allergy, unspecified, initial encounter: Secondary | ICD-10-CM

## 2022-03-20 DIAGNOSIS — H1013 Acute atopic conjunctivitis, bilateral: Secondary | ICD-10-CM

## 2022-03-20 MED ORDER — FLUTICASONE PROPIONATE 50 MCG/ACT NA SUSP: 2.0000 | Freq: Every day | NASAL | 6 refills | Status: DC

## 2022-03-20 MED ORDER — OLOPATADINE HCL 0.1 % OP SOLN: 1.0000 [drp] | Freq: Two times a day (BID) | OPHTHALMIC | 0 refills | Status: DC

## 2022-03-20 NOTE — Progress Notes (Signed)
E visit for Allergic Rhinitis We are sorry that you are not feeling well.  Here is how we plan to help!  Based on what you have shared with me it looks like you have Allergic Rhinitis.  Rhinitis is when a reaction occurs that causes nasal congestion, runny nose, sneezing, and itching.  Most types of rhinitis are caused by an inflammation and are associated with symptoms in the eyes ears or throat. There are several types of rhinitis.  The most common are acute rhinitis, which is usually caused by a viral illness, allergic or seasonal rhinitis, and nonallergic or year-round rhinitis.  Nasal allergies occur certain times of the year.  Allergic rhinitis is caused when allergens in the air trigger the release of histamine in the body.  Histamine causes itching, swelling, and fluid to build up in the fragile linings of the nasal passages, sinuses and eyelids.  An itchy nose and clear discharge are common.  I recommend the following over the counter treatments: You can continue Zyrtec oral daily   I also would recommend a nasal spray: Flonase 2 sprays into each nostril once daily  You may also benefit from eye drops such as: Meds ordered this encounter  Medications   olopatadine (PATANOL) 0.1 % ophthalmic solution    Sig: Place 1 drop into both eyes 2 (two) times daily.    Dispense:  5 mL    Refill:  0   fluticasone (FLONASE) 50 MCG/ACT nasal spray    Sig: Place 2 sprays into both nostrils daily.    Dispense:  16 g    Refill:  6     HOME CARE:  You can use an over-the-counter saline nasal spray as needed Avoid areas where there is heavy dust, mites, or molds Stay indoors on windy days during the pollen season Keep windows closed in home, at least in bedroom; use air conditioner. Use high-efficiency house air filter Keep windows closed in car, turn AC on re-circulate Avoid playing out with dog during pollen season  GET HELP RIGHT AWAY IF:  If your symptoms do not improve within 10  days You become short of breath You develop yellow or green discharge from your nose for over 3 days You have coughing fits  MAKE SURE YOU:  Understand these instructions Will watch your condition Will get help right away if you are not doing well or get worse  Thank you for choosing an e-visit. Your e-visit answers were reviewed by a board certified advanced clinical practitioner to complete your personal care plan. Depending upon the condition, your plan could have included both over the counter or prescription medications. Please review your pharmacy choice. Be sure that the pharmacy you have chosen is open so that you can pick up your prescription now.  If there is a problem you may message your provider in [place] to have the prescription routed to another pharmacy. Your safety is important to [place]. If you have drug allergies check your prescription carefully.  For the next 24 hours, you can use MyChart to ask questions about today's visit, request a non-urgent call back, or ask for a work or school excuse from your e-visit provider. You will get an email in the next two days asking about your experience. I hope that your e-visit has been valuable and will speed your recovery.  I spent approximately 5 minutes reviewing the patient's history, current symptoms and coordinating their care today.

## 2022-04-02 ENCOUNTER — Telehealth: Payer: Self-pay | Admitting: Physician Assistant

## 2022-04-02 DIAGNOSIS — T7840XA Allergy, unspecified, initial encounter: Secondary | ICD-10-CM

## 2022-04-02 MED ORDER — PREDNISONE 10 MG (21) PO TBPK: ORAL_TABLET | ORAL | 0 refills | Status: DC

## 2022-04-02 NOTE — Patient Instructions (Signed)
Lenn Cal, thank you for joining Mar Daring, PA-C for today's virtual visit.  While this provider is not your primary care provider (PCP), if your PCP is located in our provider database this encounter information will be shared with them immediately following your visit.   [place] account gives you access to today's visit and all your visits, tests, and labs performed at [facility] " click here if you don't have a [place] account or go to [email or web address]  Consent: (Patient) Rohan Brixey provided verbal consent for this virtual visit at the beginning of the encounter.  Current Medications:  Current Outpatient Medications:    predniSONE (STERAPRED UNI-PAK 21 TAB) 10 MG (21) TBPK tablet, 6 day taper; take as directed on package instructions, Disp: 21 tablet, Rfl: 0   clotrimazole (CLOTRIMAZOLE ANTI-FUNGAL) 1 % cream, Apply 1 application. topically 2 (two) times daily., Disp: 30 g, Rfl: 0   erythromycin ophthalmic ointment, Place 1 Application into the right eye in the morning and at bedtime. For 5 days, Disp: 10 g, Rfl: 0   fluticasone (FLONASE) 50 MCG/ACT nasal spray, Place 2 sprays into both nostrils daily., Disp: 16 g, Rfl: 0   fluticasone (FLONASE) 50 MCG/ACT nasal spray, Place 2 sprays into both nostrils daily., Disp: 16 g, Rfl: 6   hydrocortisone (ANUSOL-HC) 2.5 % rectal cream, Place 1 Application rectally 2 (two) times daily., Disp: 60 g, Rfl: 0   ipratropium (ATROVENT) 0.03 % nasal spray, Place 2 sprays into both nostrils every 12 (twelve) hours., Disp: 30 mL, Rfl: 0   meloxicam (MOBIC) 7.5 MG tablet, Take 1 tablet (7.5 mg total) by mouth daily., Disp: 7 tablet, Rfl: 0   methocarbamol (ROBAXIN) 500 MG tablet, Take 1 tablet (500 mg total) by mouth 2 (two) times daily., Disp: 20 tablet, Rfl: 0   terbinafine (LAMISIL) 250 MG tablet, Take 1 tablet (250 mg total) by mouth daily., Disp: 7 tablet, Rfl: 0   Medications ordered in  this encounter:  Meds ordered this encounter  Medications   predniSONE (STERAPRED UNI-PAK 21 TAB) 10 MG (21) TBPK tablet    Sig: 6 day taper; take as directed on package instructions    Dispense:  21 tablet    Refill:  0    Order Specific Question:   Supervising Provider    Answer:   Chase Picket [id number]     *If you need refills on other medications prior to your next appointment, please contact your pharmacy*  Follow-Up: Call back or seek an in-person evaluation if the symptoms worsen or if the condition fails to improve as anticipated.  [place] [phone]  Other Instructions  Contact Dermatitis Dermatitis is redness, soreness, and swelling (inflammation) of the skin. Contact dermatitis is a reaction to certain substances that touch the skin. There are two types of this condition: Irritant contact dermatitis. This is the most common type. It happens when something irritates your skin, such as when your hands get dry from washing them too often with soap. You can get this type of reaction even if you have not been exposed to the irritant before. Allergic contact dermatitis. This type is caused by a substance that you are allergic to, such as poison ivy. It occurs when you have been exposed to the substance (allergen) and form a sensitivity to it. In some cases, the reaction may start soon after your first exposure to the allergen. In other cases, it may not start until you  are exposed to the allergen again. It may then occur every time you are exposed to the allergen in the future. What are the causes? Irritant contact dermatitis is often caused by exposure to: Makeup. Soaps, detergents, and bleaches. Acids. Metal salts, such as nickel. Allergic contact dermatitis is often caused by exposure to: Poisonous plants. Chemicals. Jewelry. Latex. Medicines. Preservatives in products, such as clothes. What increases the risk? You are more likely to get this  condition if you have: A job that exposes you to irritants or allergens. Certain medical conditions. These include asthma and eczema. What are the signs or symptoms? Symptoms of this condition may occur in any place on your body that has been touched by the irritant. Symptoms include: Dryness, flaking, or cracking. Redness. Itching. Pain or a burning feeling. Blisters. Drainage of small amounts of blood or clear fluid from skin cracks. With allergic contact dermatitis, there may also be swelling in areas such as the eyelids, mouth, or genitals. How is this diagnosed? This condition is diagnosed with a medical history and physical exam. A patch skin test may be done to help figure out the cause. If the condition is related to your job, you may need to see an expert in health problems in the workplace (occupational medicine specialist). How is this treated? This condition is treated by staying away from the cause of the reaction and protecting your skin from further contact. Treatment may also include: Steroid creams or ointments. Steroid medicines may need be taken by mouth (orally) in more severe cases. Antibiotics or medicines applied to the skin to kill bacteria (antibacterial ointments). These may be needed if a skin infection is present. Antihistamines. These may be taken orally or put on as a lotion to ease itching. A bandage (dressing). Follow these instructions at home: Skin care Moisturize your skin as needed. Put cool, wet cloths (cool compresses) on the affected areas. Try applying baking soda paste to your skin. Stir water into baking soda until it has the consistency of a paste. Do not scratch your skin. Avoid friction to the affected area. Avoid the use of soaps, perfumes, and dyes. Check the affected areas every day for signs of infection. Check for: More redness, swelling, or pain. More fluid or blood. Warmth. Pus or a bad smell. Medicines Take or apply  over-the-counter and prescription medicines only as told by your health care provider. If you were prescribed antibiotics, take or apply them as told by your health care provider. Do not stop using the antibiotic even if you start to feel better. Bathing Try taking a bath with: Epsom salts. Follow the instructions on the packaging. You can get these at your local pharmacy or grocery store. Baking soda. Pour a small amount into the bath as told by your health care provider. Colloidal oatmeal. Follow the instructions on the packaging. You can get this at your local pharmacy or grocery store. Bathe less often. This may mean bathing every other day. Bathe in lukewarm water. Avoid using hot water. Bandage care If you were given a dressing, change it as told by your health care provider. Wash your hands with soap and water for at least 20 seconds before and after you change your dressing. If soap and water are not available, use hand sanitizer. General instructions Avoid the substance that caused your reaction. If you do not know what caused it, keep a journal to try to track what caused it. Write down: What you eat and drink. What cosmetics  you use. What you wear in the affected area. This includes jewelry. Contact a health care provider if: Your condition does not get better with treatment. Your condition gets worse. You have any signs of infection. You have a fever. You have new symptoms. Your bone or joint under the affected area becomes painful after the skin has healed. Get help right away if: You notice red streaks coming from the affected area. The affected area turns darker. You have trouble breathing. This information is not intended to replace advice given to you by your health care provider. Make sure you discuss any questions you have with your health care provider. Document Revised: 08/02/2021 Document Reviewed: 08/02/2021 Elsevier Patient Education  [place].    If you have been instructed to have an in-person evaluation today at a local Urgent Care facility, please use the link below. It will take you to a list of all of our available [facility], including address, phone number and hours of operation. Please do not delay care.  [facility]  If you or a family member do not have a primary care provider, use the link below to schedule a visit and establish care. When you choose a [facility] primary care physician or advanced practice provider, you gain a long-term partner in health. Find a Primary Care Provider  Learn more about [facility] in-office and virtual care options: [facility]

## 2022-04-02 NOTE — Progress Notes (Signed)
Virtual Visit Consent   Jeffrey Chang, you are scheduled for a virtual visit with a [facility] provider today. Just as with appointments in the office, your consent must be obtained to participate. Your consent will be active for this visit and any virtual visit you may have with one of our providers in the next 365 days. If you have a MyChart account, a copy of this consent can be sent to you electronically.  As this is a virtual visit, video technology does not allow for your provider to perform a traditional examination. This may limit your provider's ability to fully assess your condition. If your provider identifies any concerns that need to be evaluated in person or the need to arrange testing (such as labs, EKG, etc.), we will make arrangements to do so. Although advances in technology are sophisticated, we cannot ensure that it will always work on either your end or our end. If the connection with a video visit is poor, the visit may have to be switched to a telephone visit. With either a video or telephone visit, we are not always able to ensure that we have a secure connection.  By engaging in this virtual visit, you consent to the provision of healthcare and authorize for your insurance to be billed (if applicable) for the services provided during this visit. Depending on your insurance coverage, you may receive a charge related to this service.  I need to obtain your verbal consent now. Are you willing to proceed with your visit today? Jeffrey Chang has provided verbal consent on 04/02/2022 for a virtual visit (video or telephone). Mar Daring, PA-C  Date: 04/02/2022 5:19 PM  Virtual Visit via Video Note   I, Mar Daring, connected with  Jeffrey Chang  ([id number], 23-Jan-1996) on 04/02/22 at  5:15 PM EST by a video-enabled telemedicine application and verified that I am speaking with the correct person using two identifiers.  Location: Patient: Virtual Visit Location  Patient: Mobile Provider: Virtual Visit Location Provider: Home Office   I discussed the limitations of evaluation and management by telemedicine and the availability of in person appointments. The patient expressed understanding and agreed to proceed.    History of Present Illness: Jeffrey Chang is a 27 y.o. who identifies as a male who was assigned male at birth, and is being seen today for allergic reaction. Has a latex allergy and was exposed sometime over the weekend. Symptoms started appearing Sunday with scratchy throat, mild irritated feeling, cough, and rash on arm and hand at exposure site. Has had reaction in 2022 and prednisone helped.   Problems:  Patient Active Problem List   Diagnosis Date Noted   Cannabis abuse with psychotic disorder, with delusions ([place]) 07/07/2016    Allergies:  Allergies  Allergen Reactions   Latex    Medications:  Current Outpatient Medications:    predniSONE (STERAPRED UNI-PAK 21 TAB) 10 MG (21) TBPK tablet, 6 day taper; take as directed on package instructions, Disp: 21 tablet, Rfl: 0   clotrimazole (CLOTRIMAZOLE ANTI-FUNGAL) 1 % cream, Apply 1 application. topically 2 (two) times daily., Disp: 30 g, Rfl: 0   erythromycin ophthalmic ointment, Place 1 Application into the right eye in the morning and at bedtime. For 5 days, Disp: 10 g, Rfl: 0   fluticasone (FLONASE) 50 MCG/ACT nasal spray, Place 2 sprays into both nostrils daily., Disp: 16 g, Rfl: 0   fluticasone (FLONASE) 50 MCG/ACT nasal spray, Place 2 sprays into both nostrils daily., Disp: 16  g, Rfl: 6   hydrocortisone (ANUSOL-HC) 2.5 % rectal cream, Place 1 Application rectally 2 (two) times daily., Disp: 60 g, Rfl: 0   ipratropium (ATROVENT) 0.03 % nasal spray, Place 2 sprays into both nostrils every 12 (twelve) hours., Disp: 30 mL, Rfl: 0   meloxicam (MOBIC) 7.5 MG tablet, Take 1 tablet (7.5 mg total) by mouth daily., Disp: 7 tablet, Rfl: 0   methocarbamol (ROBAXIN) 500 MG tablet, Take 1  tablet (500 mg total) by mouth 2 (two) times daily., Disp: 20 tablet, Rfl: 0   terbinafine (LAMISIL) 250 MG tablet, Take 1 tablet (250 mg total) by mouth daily., Disp: 7 tablet, Rfl: 0  Observations/Objective: Patient is well-developed, well-nourished in no acute distress.  Resting comfortably Head is normocephalic, atraumatic.  No labored breathing.  Speech is clear and coherent with logical content.  Patient is alert and oriented at baseline.    Assessment and Plan: 1. Allergic reaction, initial encounter - predniSONE (STERAPRED UNI-PAK 21 TAB) 10 MG (21) TBPK tablet; 6 day taper; take as directed on package instructions  Dispense: 21 tablet; Refill: 0  - Prednisone taper for allergic reaction - Continue Zyrtec - Can apply hydrocortisone topically if needed  Follow Up Instructions: I discussed the assessment and treatment plan with the patient. The patient was provided an opportunity to ask questions and all were answered. The patient agreed with the plan and demonstrated an understanding of the instructions.  A copy of instructions were sent to the patient via MyChart unless otherwise noted below.    The patient was advised to call back or seek an in-person evaluation if the symptoms worsen or if the condition fails to improve as anticipated.  Time:  I spent 10 minutes with the patient via telehealth technology discussing the above problems/concerns.    Mar Daring, PA-C

## 2022-04-10 ENCOUNTER — Telehealth: Payer: MEDICAID | Admitting: Nurse Practitioner

## 2022-04-10 DIAGNOSIS — H66002 Acute suppurative otitis media without spontaneous rupture of ear drum, left ear: Secondary | ICD-10-CM

## 2022-04-10 MED ORDER — AMOXICILLIN 875 MG PO TABS
875.0000 mg | ORAL_TABLET | Freq: Two times a day (BID) | ORAL | 0 refills | Status: AC
Start: 2022-04-10 — End: 2022-04-20

## 2022-04-10 NOTE — Progress Notes (Signed)
E-Visit for Ear Pain - Acute Otitis Media   We are sorry that you are not feeling well. Here is how we plan to help!  Based on what you have shared with me it looks like you have Acute Otitis Media.  Acute Otitis Media is an infection of the middle or "inner" ear. This type of infection can cause redness, inflammation, and fluid buildup behind the tympanic membrane (ear drum).  The usual symptoms include: Earache/Pain Fever Upper respiratory symptoms Lack of energy/Fatigue/Malaise Slight hearing loss gradually worsening- if the inner ear fills with fluid What causes middle ear infections? Most middle ear infections occur when an infection such as a cold, leads to a build-up of mucus in the middle ear and causes the Eustachian tube (a thin tube that runs from the middle ear to the back of the nose) to become swollen or blocked.   This means mucus can't drain away properly, making it easier for an infection to spread into the middle ear.  How middle ear infections are treated: Most ear infections clear up within three to five days and don't need any specific treatment. If necessary, tylenol or ibuprofen should be used to relieve pain and a high temperature.  If you develop a fever higher than 102, or any significantly worsening symptoms, this could indicate a more serious infection moving to the middle/inner and needs face to face evaluation in an office by a provider.   Antibiotics aren't routinely used to treat middle ear infections, although they may occasionally be prescribed if symptoms persist or are particularly severe. Given your presentation,   I have prescribed Amoxicillin 875 mg one tablet twice daily for 10 days    Your symptoms should improve over the next 3 days and should resolve in about 7 days. Be sure to complete ALL of the prescription(s) given.  HOME CARE: Wash your hands frequently. If you are prescribed an ear drop, do not place the tip of the bottle on your ear or  touch it with your fingers. You can take Acetaminophen 650 mg every 4-6 hours as needed for pain.  If pain is severe or moderate, you can apply a heating pad (set on low) or hot water bottle (wrapped in a towel) to outer ear for 20 minutes.  This will also increase drainage.  GET HELP RIGHT AWAY IF: Fever is over 102.2 degrees. You develop progressive ear pain or hearing loss. Ear symptoms persist longer than 3 days after treatment.  MAKE SURE YOU: Understand these instructions. Will watch your condition. Will get help right away if you are not doing well or get worse.  Thank you for choosing an e-visit.  Your e-visit answers were reviewed by a board certified advanced clinical practitioner to complete your personal care plan. Depending upon the condition, your plan could have included both over the counter or prescription medications.  Please review your pharmacy choice. Make sure the pharmacy is open so you can pick up the prescription now. If there is a problem, you may contact your provider through [employer] and have the prescription routed to another pharmacy.  Your safety is important to [place]. If you have drug allergies check your prescription carefully.   For the next 24 hours you can use MyChart to ask questions about today's visit, request a non-urgent call back, or ask for a work or school excuse. You will get an email with a survey after your eVisit asking about your experience. We would appreciate your feedback. I  hope that your e-visit has been valuable and will aid in your recovery.  Meds ordered this encounter  Medications   amoxicillin (AMOXIL) 875 MG tablet    Sig: Take 1 tablet (875 mg total) by mouth 2 (two) times daily for 10 days.    Dispense:  20 tablet    Refill:  0     I spent approximately 5 minutes reviewing the patient's history, current symptoms and coordinating their care today.

## 2022-04-26 ENCOUNTER — Telehealth: Payer: MEDICAID | Admitting: Family Medicine

## 2022-04-26 DIAGNOSIS — B353 Tinea pedis: Secondary | ICD-10-CM

## 2022-04-26 DIAGNOSIS — B356 Tinea cruris: Secondary | ICD-10-CM

## 2022-04-26 MED ORDER — CLOTRIMAZOLE 1 % EX CREA: 1.0000 | TOPICAL_CREAM | Freq: Two times a day (BID) | CUTANEOUS | 0 refills | Status: DC

## 2022-04-26 NOTE — Progress Notes (Signed)
E-Visit for Athlete's Foot  We are sorry that you are not feeling well. Here is how we plan to help!  Based on what you shared with me it looks like you have tinea pedis, or "Athlete's Foot".  This type of rash can spread through shared towels, clothing, bedding, etc., as well as hard surfaces (particularly in moist areas) such as shower stalls, locker room floors, pool areas, etc. The symptoms of Athlete's Foot include red, swollen, peeling, itchy skin between the toes (especially between the pinky toe and the one next to it). The sole and heel of the foot may also be affected. In severe cases, the skin on the feet can blister.  Athlete's foot can usually be treated with over-the-counter topical antifungal products; but sometimes with chronic or extensive tinea pedis, prescription oral medications are needed.   I am recommending:Clotrimazole 1% cream or gel, apply to area twice per day   Prescription medications are only indicated for an extensive rash or if over the counter treatments have failed.   HOME CARE:  Keep feet clean, dry, and cool. Avoid using swimming pools, public showers, or foot baths. Wear sandals when possible or air shoes out by alternating them every 2-3 days. Avoid wearing closed shoes and wearing socks made from fabric that doesn't dry easily (for example, nylon). Treat the infection with recommended medication  GET HELP RIGHT AWAY IF:  Symptoms that don't go away after treatment. Severe itching that persists. If your rash spreads or swells. If your rash begins to have drainage or smell. You develop a fever.  MAKE SURE YOU   Understand these instructions. Will watch your condition. Will get help right away if you are not doing well or get worse.   Thank you for choosing an e-visit.  Your e-visit answers were reviewed by a board certified advanced clinical practitioner to complete your personal care plan. Depending upon the condition, your plan  could have included both over the counter or prescription medications.  Please review your pharmacy choice. Make sure the pharmacy is open so you can pick up prescription now. If there is a problem, you may contact your provider through [employer] and have the prescription routed to another pharmacy.  Your safety is important to [place]. If you have drug allergies check your prescription carefully.   For the next 24 hours you can use MyChart to ask questions about today's visit, request a non-urgent call back, or ask for a work or school excuse.  You will get an email in the next two days asking about your experience. I hope that your e-visit has been valuable and will speed your recovery  References or for more information:  [email or web address]?search=[email or web address]&source=search_result&selectedTitle=1~104&usage_type=default&display_rank=1  [email or web address]    have provided 5 minutes of non face to face time during this encounter for chart review and documentation.

## 2022-05-08 ENCOUNTER — Telehealth: Payer: MEDICAID | Admitting: Family Medicine

## 2022-05-08 ENCOUNTER — Telehealth: Payer: Self-pay | Admitting: Physician Assistant

## 2022-05-08 ENCOUNTER — Ambulatory Visit: Payer: Self-pay

## 2022-05-08 DIAGNOSIS — T300 Burn of unspecified body region, unspecified degree: Secondary | ICD-10-CM

## 2022-05-08 DIAGNOSIS — L739 Follicular disorder, unspecified: Secondary | ICD-10-CM

## 2022-05-08 MED ORDER — CEPHALEXIN 500 MG PO CAPS: 500.0000 mg | ORAL_CAPSULE | Freq: Two times a day (BID) | ORAL | 0 refills | Status: AC

## 2022-05-08 NOTE — [place] Notes (Signed)
Virtual Visit Consent   Jeffrey Chang, you are scheduled for a virtual visit with a [facility] provider today. Just as with appointments in the office, your consent must be obtained to participate. Your consent will be active for this visit and any virtual visit you may have with one of our providers in the next 365 days. If you have a MyChart account, a copy of this consent can be sent to you electronically.  As this is a virtual visit, video technology does not allow for your provider to perform a traditional examination. This may limit your provider's ability to fully assess your condition. If your provider identifies any concerns that need to be evaluated in person or the need to arrange testing (such as labs, EKG, etc.), we will make arrangements to do so. Although advances in technology are sophisticated, we cannot ensure that it will always work on either your end or our end. If the connection with a video visit is poor, the visit may have to be switched to a telephone visit. With either a video or telephone visit, we are not always able to ensure that we have a secure connection.  By engaging in this virtual visit, you consent to the provision of healthcare and authorize for your insurance to be billed (if applicable) for the services provided during this visit. Depending on your insurance coverage, you may receive a charge related to this service.  I need to obtain your verbal consent now. Are you willing to proceed with your visit today? Jeffrey Chang has provided verbal consent on 05/08/2022 for a virtual visit (video or telephone). Leeanne Rio, [place]  Date: 05/08/2022 1:30 PM  Virtual Visit via Video Note   I, Leeanne Rio, connected with  Jeffrey Chang  ([id number], Sep 21, 1995) on 05/08/22 at 12:30 PM EDT by a video-enabled telemedicine application and verified that I am speaking with the correct person using two identifiers.  Location: Patient: Virtual Visit Location  Patient: Home Provider: Virtual Visit Location Provider: Home Office   I discussed the limitations of evaluation and management by telemedicine and the availability of in person appointments. The patient expressed understanding and agreed to proceed.    History of Present Illness: Jeffrey Chang is a 27 y.o. who identifies as a male who was assigned male at birth, and is being seen today for small bump of R inguinal region. Turning into a pimple that is tender. Soaked and drainage but still painful and with surrounding redness. Denies overt fevers. Denies new sexual partners.   HPI: HPI  Problems:  Patient Active Problem List   Diagnosis Date Noted   Cannabis abuse with psychotic disorder, with delusions ([place]) 07/07/2016    Allergies:  Allergies  Allergen Reactions   Latex    Medications:  Current Outpatient Medications:    cephALEXin (KEFLEX) 500 MG capsule, Take 1 capsule (500 mg total) by mouth 2 (two) times daily for 7 days., Disp: 14 capsule, Rfl: 0   clotrimazole (CLOTRIMAZOLE ANTI-FUNGAL) 1 % cream, Apply 1 Application topically 2 (two) times daily., Disp: 30 g, Rfl: 0   fluticasone (FLONASE) 50 MCG/ACT nasal spray, Place 2 sprays into both nostrils daily., Disp: 16 g, Rfl: 0   fluticasone (FLONASE) 50 MCG/ACT nasal spray, Place 2 sprays into both nostrils daily., Disp: 16 g, Rfl: 6   hydrocortisone (ANUSOL-HC) 2.5 % rectal cream, Place 1 Application rectally 2 (two) times daily., Disp: 60 g, Rfl: 0   ipratropium (ATROVENT) 0.03 % nasal spray, Place 2  sprays into both nostrils every 12 (twelve) hours., Disp: 30 mL, Rfl: 0   meloxicam (MOBIC) 7.5 MG tablet, Take 1 tablet (7.5 mg total) by mouth daily., Disp: 7 tablet, Rfl: 0   methocarbamol (ROBAXIN) 500 MG tablet, Take 1 tablet (500 mg total) by mouth 2 (two) times daily., Disp: 20 tablet, Rfl: 0  Observations/Objective: Patient is well-developed, well-nourished in no acute distress.  Resting comfortably at home.  Head is  normocephalic, atraumatic.  No labored breathing. Speech is clear and coherent with logical content.  Patient is alert and oriented at baseline.   Assessment and Plan: 1. Folliculitis - cephALEXin (KEFLEX) 500 MG capsule; Take 1 capsule (500 mg total) by mouth 2 (two) times daily for 7 days.  Dispense: 14 capsule; Refill: 0  Supportive measures and OTC medications reviewed. Keflex 500 mg BID x 7 days. In-person if not improving/resolving.   Follow Up Instructions: I discussed the assessment and treatment plan with the patient. The patient was provided an opportunity to ask questions and all were answered. The patient agreed with the plan and demonstrated an understanding of the instructions.  A copy of instructions were sent to the patient via MyChart unless otherwise noted below.   The patient was advised to call back or seek an in-person evaluation if the symptoms worsen or if the condition fails to improve as anticipated.  Time:  I spent 10 minutes with the patient via telehealth technology discussing the above problems/concerns.    Leeanne Rio, PA-C

## 2022-05-08 NOTE — Patient Instructions (Signed)
  Lenn Cal, thank you for joining Leeanne Rio, PA-C for today's virtual visit.  While this provider is not your primary care provider (PCP), if your PCP is located in our provider database this encounter information will be shared with them immediately following your visit.   [facility] account gives you access to today's visit and all your visits, tests, and labs performed at [facility] " click here if you don't have a [facility] account or go to [email or web address]  Consent: (Patient) Jeffrey Chang provided verbal consent for this virtual visit at the beginning of the encounter.  Current Medications:  Current Outpatient Medications:    clotrimazole (CLOTRIMAZOLE ANTI-FUNGAL) 1 % cream, Apply 1 Application topically 2 (two) times daily., Disp: 30 g, Rfl: 0   erythromycin ophthalmic ointment, Place 1 Application into the right eye in the morning and at bedtime. For 5 days, Disp: 10 g, Rfl: 0   fluticasone (FLONASE) 50 MCG/ACT nasal spray, Place 2 sprays into both nostrils daily., Disp: 16 g, Rfl: 0   fluticasone (FLONASE) 50 MCG/ACT nasal spray, Place 2 sprays into both nostrils daily., Disp: 16 g, Rfl: 6   hydrocortisone (ANUSOL-HC) 2.5 % rectal cream, Place 1 Application rectally 2 (two) times daily., Disp: 60 g, Rfl: 0   ipratropium (ATROVENT) 0.03 % nasal spray, Place 2 sprays into both nostrils every 12 (twelve) hours., Disp: 30 mL, Rfl: 0   meloxicam (MOBIC) 7.5 MG tablet, Take 1 tablet (7.5 mg total) by mouth daily., Disp: 7 tablet, Rfl: 0   methocarbamol (ROBAXIN) 500 MG tablet, Take 1 tablet (500 mg total) by mouth 2 (two) times daily., Disp: 20 tablet, Rfl: 0   predniSONE (STERAPRED UNI-PAK 21 TAB) 10 MG (21) TBPK tablet, 6 day taper; take as directed on package instructions, Disp: 21 tablet, Rfl: 0   terbinafine (LAMISIL) 250 MG tablet, Take 1 tablet (250 mg total) by mouth daily., Disp: 7 tablet, Rfl: 0   Medications ordered in  this encounter:  No orders of the defined types were placed in this encounter.    *If you need refills on other medications prior to your next appointment, please contact your pharmacy*  Follow-Up: Call back or seek an in-person evaluation if the symptoms worsen or if the condition fails to improve as anticipated.  [facility] [phone]  Other Instructions Keep skin clean and dry. Apply warm compresses as discussed. Take Keflex as directed. If symptoms are not resolving or you note new or worsening symptoms, please seek an in-person evaluation.    If you have been instructed to have an in-person evaluation today at a local Urgent Care facility, please use the link below. It will take you to a list of all of our available [facility], including address, phone number and hours of operation. Please do not delay care.  [facility]  If you or a family member do not have a primary care provider, use the link below to schedule a visit and establish care. When you choose a [facility] primary care physician or advanced practice provider, you gain a long-term partner in health. Find a Primary Care Provider  Learn more about [facility]'s in-office and virtual care options: [facility]
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
## 2022-05-15 ENCOUNTER — Telehealth: Payer: Self-pay | Admitting: Urgent Care

## 2022-05-15 DIAGNOSIS — H5710 Ocular pain, unspecified eye: Secondary | ICD-10-CM

## 2022-05-15 NOTE — [place] Notes (Signed)
Based on what you shared with me, I feel your condition warrants further evaluation as soon as possible at an Emergency department.    NOTE: There will be NO CHARGE for this eVisit   If you are having a true medical emergency please call 911.      Emergency Department-[facility]  Get Driving Directions  [phone]  [address]  [name], [place] [address]  Open 24/7/365      [facility] [address]  Get Driving Directions  [address]  [place], [place] [address]  Open 24/7/365    Emergency Department- [facility]  Get Driving Directions  [phone]  [address]  [place], [place] [address]  Open 24/7/365      [facility]  Get Driving Directions  [phone]  [address]  [place], [place] [address]  Open 24/7/365    [place]  Emergency Department- [facility]  Get Driving Directions  [phone]  [address]  [place], [place] [address]  Open 24/7/365    [place]  Emergency Department- [facility] [place]  Get Driving Directions  [address]  [place],  [address]  Open 24/7/365    [place]  Emergency Department- [facility]  Get Driving Directions  [phone]  [address]  [place],  [address]  Open 24/7/365    

## 2022-05-30 ENCOUNTER — Telehealth: Payer: Self-pay | Admitting: Physician Assistant

## 2022-05-30 DIAGNOSIS — B9689 Other specified bacterial agents as the cause of diseases classified elsewhere: Secondary | ICD-10-CM

## 2022-05-30 DIAGNOSIS — J019 Acute sinusitis, unspecified: Secondary | ICD-10-CM

## 2022-05-30 MED ORDER — AMOXICILLIN-POT CLAVULANATE 875-125 MG PO TABS: 1.0000 | ORAL_TABLET | Freq: Two times a day (BID) | ORAL | 0 refills | Status: DC

## 2022-05-30 NOTE — Patient Instructions (Signed)
Jeffrey Chang, thank you for joining [name], PA-C for today's virtual visit.  While this provider is not your primary care provider (PCP), if your PCP is located in our provider database this encounter information will be shared with them immediately following your visit.   A [facility] MyChart account gives you access to today's visit and all your visits, tests, and labs performed at [facility] " click here if you don't have a [facility] MyChart account or go to [email or web address]  Consent: (Patient) Jeffrey Chang provided verbal consent for this virtual visit at the beginning of the encounter.  Current Medications:  Current Outpatient Medications:    clotrimazole (CLOTRIMAZOLE ANTI-FUNGAL) 1 % cream, Apply 1 Application topically 2 (two) times daily., Disp: 30 g, Rfl: 0   fluticasone (FLONASE) 50 MCG/ACT nasal spray, Place 2 sprays into both nostrils daily., Disp: 16 g, Rfl: 0   fluticasone (FLONASE) 50 MCG/ACT nasal spray, Place 2 sprays into both nostrils daily., Disp: 16 g, Rfl: 6   hydrocortisone (ANUSOL-HC) 2.5 % rectal cream, Place 1 Application rectally 2 (two) times daily., Disp: 60 g, Rfl: 0   ipratropium (ATROVENT) 0.03 % nasal spray, Place 2 sprays into both nostrils every 12 (twelve) hours., Disp: 30 mL, Rfl: 0   meloxicam (MOBIC) 7.5 MG tablet, Take 1 tablet (7.5 mg total) by mouth daily., Disp: 7 tablet, Rfl: 0   methocarbamol (ROBAXIN) 500 MG tablet, Take 1 tablet (500 mg total) by mouth 2 (two) times daily., Disp: 20 tablet, Rfl: 0   Medications ordered in this encounter:  No orders of the defined types were placed in this encounter.    *If you need refills on other medications prior to your next appointment, please contact your pharmacy*  Follow-Up: Call back or seek an in-person evaluation if the symptoms worsen or if the condition fails to improve as anticipated.  [facility] [phone]  Other  Instructions Please take antibiotic as directed.  Increase fluid intake.  Use Saline nasal spray.  Take a daily multivitamin. Continue your OTC antihistamine and nasal steroid.  Place a humidifier in the bedroom.  Please call or return clinic if symptoms are not improving.  Sinusitis Sinusitis is redness, soreness, and swelling (inflammation) of the paranasal sinuses. Paranasal sinuses are air pockets within the bones of your face (beneath the eyes, the middle of the forehead, or above the eyes). In healthy paranasal sinuses, mucus is able to drain out, and air is able to circulate through them by way of your nose. However, when your paranasal sinuses are inflamed, mucus and air can become trapped. This can allow bacteria and other germs to grow and cause infection. Sinusitis can develop quickly and last only a short time (acute) or continue over a long period (chronic). Sinusitis that lasts for more than 12 weeks is considered chronic.  CAUSES  Causes of sinusitis include: Allergies. Structural abnormalities, such as displacement of the cartilage that separates your nostrils (deviated septum), which can decrease the air flow through your nose and sinuses and affect sinus drainage. Functional abnormalities, such as when the small hairs (cilia) that line your sinuses and help remove mucus do not work properly or are not present. SYMPTOMS  Symptoms of acute and chronic sinusitis are the same. The primary symptoms are pain and pressure around the affected sinuses. Other symptoms include: Upper toothache. Earache. Headache. Bad breath. Decreased sense of smell and taste. A cough, which worsens when you are lying flat. Fatigue. Fever.  Thick drainage from your nose, which often is green and may contain pus (purulent). Swelling and warmth over the affected sinuses. DIAGNOSIS  Your caregiver will perform a physical exam. During the exam, your caregiver may: Look in your nose for signs of abnormal  growths in your nostrils (nasal polyps). Tap over the affected sinus to check for signs of infection. View the inside of your sinuses (endoscopy) with a special imaging device with a light attached (endoscope), which is inserted into your sinuses. If your caregiver suspects that you have chronic sinusitis, one or more of the following tests may be recommended: Allergy tests. Nasal culture A sample of mucus is taken from your nose and sent to a lab and screened for bacteria. Nasal cytology A sample of mucus is taken from your nose and examined by your caregiver to determine if your sinusitis is related to an allergy. TREATMENT  Most cases of acute sinusitis are related to a viral infection and will resolve on their own within 10 days. Sometimes medicines are prescribed to help relieve symptoms (pain medicine, decongestants, nasal steroid sprays, or saline sprays).  However, for sinusitis related to a bacterial infection, your caregiver will prescribe antibiotic medicines. These are medicines that will help kill the bacteria causing the infection.  Rarely, sinusitis is caused by a fungal infection. In theses cases, your caregiver will prescribe antifungal medicine. For some cases of chronic sinusitis, surgery is needed. Generally, these are cases in which sinusitis recurs more than 3 times per year, despite other treatments. HOME CARE INSTRUCTIONS  Drink plenty of water. Water helps thin the mucus so your sinuses can drain more easily. Use a humidifier. Inhale steam 3 to 4 times a day (for example, sit in the bathroom with the shower running). Apply a warm, moist washcloth to your face 3 to 4 times a day, or as directed by your caregiver. Use saline nasal sprays to help moisten and clean your sinuses. Take over-the-counter or prescription medicines for pain, discomfort, or fever only as directed by your caregiver. SEEK IMMEDIATE MEDICAL CARE IF: You have increasing pain or severe headaches. You  have nausea, vomiting, or drowsiness. You have swelling around your face. You have vision problems. You have a stiff neck. You have difficulty breathing. MAKE SURE YOU:  Understand these instructions. Will watch your condition. Will get help right away if you are not doing well or get worse. Document Released: 01/27/2005 Document Revised: 04/21/2011 Document Reviewed: 02/11/2011 [facility] Patient Information 2014 [place], [place].    If you have been instructed to have an in-person evaluation today at a local Urgent Care facility, please use the link below. It will take you to a list of all of our available [facility], including address, phone number and hours of operation. Please do not delay care.  [facility]  If you or a family member do not have a primary care provider, use the link below to schedule a visit and establish care. When you choose a [facility] primary care physician or advanced practice provider, you gain a long-term partner in health. Find a Primary Care Provider  Learn more about [facility]'s in-office and virtual care options: [facility] - Get Care Now

## 2022-05-30 NOTE — [place] Notes (Signed)
Virtual Visit Consent   Jeffrey Chang, you are scheduled for a virtual visit with a [facility] provider today. Just as with appointments in the office, your consent must be obtained to participate. Your consent will be active for this visit and any virtual visit you may have with one of our providers in the next 365 days. If you have a MyChart account, a copy of this consent can be sent to you electronically.  As this is a virtual visit, video technology does not allow for your provider to perform a traditional examination. This may limit your provider's ability to fully assess your condition. If your provider identifies any concerns that need to be evaluated in person or the need to arrange testing (such as labs, EKG, etc.), we will make arrangements to do so. Although advances in technology are sophisticated, we cannot ensure that it will always work on either your end or our end. If the connection with a video visit is poor, the visit may have to be switched to a telephone visit. With either a video or telephone visit, we are not always able to ensure that we have a secure connection.  By engaging in this virtual visit, you consent to the provision of healthcare and authorize for your insurance to be billed (if applicable) for the services provided during this visit. Depending on your insurance coverage, you may receive a charge related to this service.  I need to obtain your verbal consent now. Are you willing to proceed with your visit today? Jeffrey Chang has provided verbal consent on 05/30/2022 for a virtual visit (video or telephone). [name], [place]  Date: 05/30/2022 4:47 PM  Virtual Visit via Video Note   I, [name], connected with  Jeffrey Chang  ([id number], [birth year]) on 05/30/22 at  4:45 PM EDT by a video-enabled telemedicine application and verified that I am speaking with the correct person using two identifiers.  Location: Patient: Virtual Visit Location  Patient: Home Provider: Virtual Visit Location Provider: Home Office   I discussed the limitations of evaluation and management by telemedicine and the availability of in person appointments. The patient expressed understanding and agreed to proceed.    History of Present Illness: Jeffrey Chang is a 27 y.o. who identifies as a male who was assigned male at birth, and is being seen today for sinus symptoms starting about a week or so ago. Notes nasal congestion, sinus pressure and ear pressure. Notes nw with maxillary sinus pain and low-grade fever. Notes thick, colored nasal discharge now. Some cough but no significant chest congestion. Allergy medications OTC without much help.  HPI: HPI  Problems:  Patient Active Problem List   Diagnosis Date Noted   Cannabis abuse with psychotic disorder, with delusions 07/07/2016    Allergies:  Allergies  Allergen Reactions   Latex    Medications:  Current Outpatient Medications:    amoxicillin-clavulanate (AUGMENTIN) 875-125 MG tablet, Take 1 tablet by mouth 2 (two) times daily., Disp: 14 tablet, Rfl: 0  Observations/Objective: Patient is well-developed, well-nourished in no acute distress.  Resting comfortably at home.  Head is normocephalic, atraumatic.  No labored breathing. Speech is clear and coherent with logical content.  Patient is alert and oriented at baseline.   Assessment and Plan: 1. Acute bacterial sinusitis - amoxicillin-clavulanate (AUGMENTIN) 875-125 MG tablet; Take 1 tablet by mouth 2 (two) times daily.  Dispense: 14 tablet; Refill: 0  Rx Augmentin.  Increase fluids.  Rest.  Saline nasal spray.  Probiotic.  Mucinex as directed.  Humidifier in bedroom. Ok to continue [employer] and OTC antihistamine.  Call or return to clinic if symptoms are not improving.   Follow Up Instructions: I discussed the assessment and treatment plan with the patient. The patient was provided an opportunity to ask questions and all were answered.  The patient agreed with the plan and demonstrated an understanding of the instructions.  A copy of instructions were sent to the patient via MyChart unless otherwise noted below.   The patient was advised to call back or seek an in-person evaluation if the symptoms worsen or if the condition fails to improve as anticipated.  Time:  I spent 10 minutes with the patient via telehealth technology discussing the above problems/concerns.    [name], PA-C

## 2022-06-08 ENCOUNTER — Telehealth: Payer: Self-pay | Admitting: Family

## 2022-06-08 ENCOUNTER — Encounter: Payer: Self-pay | Admitting: Family

## 2022-06-08 DIAGNOSIS — B353 Tinea pedis: Secondary | ICD-10-CM

## 2022-06-08 MED ORDER — TERBINAFINE HCL 250 MG PO TABS: 250.0000 mg | ORAL_TABLET | Freq: Every day | ORAL | 0 refills | Status: DC

## 2022-06-08 MED ORDER — CLOTRIMAZOLE 1 % EX CREA: 1.0000 | TOPICAL_CREAM | Freq: Two times a day (BID) | CUTANEOUS | 0 refills | Status: DC

## 2022-06-08 NOTE — Patient Instructions (Signed)
Athlete's Foot Athlete's foot (tinea pedis) is a fungal infection of the skin on your feet. It often occurs on the skin that is between or underneath the toes. It can also occur on the soles of your feet. The infection can spread from person to person (is contagious). It can also spread when a person's bare feet come in contact with the fungus on shower floors or on items such as shoes. What are the causes? This condition is caused by a fungus that grows in warm, moist places. You can get athlete's foot by sharing shoes, shower stalls, towels, and wet floors with someone who is infected. Not washing your feet or changing your socks often enough can also lead to athlete's foot. What increases the risk? This condition is more likely to develop in: Men. People who have a weak body defense system (immune system). People who have diabetes. People who use public showers, such as at a gym. People who wear heavy-duty shoes, such as industrial or military shoes. Seasons with warm, humid weather. What are the signs or symptoms? Symptoms of this condition include: Itchy areas between your toes or on the soles of your feet. White, flaky, or scaly areas between your toes or on the soles of your feet. Very itchy small blisters between your toes or on the soles of your feet. Small cuts in your skin. These cuts can become infected. Thick or discolored toenails. How is this diagnosed? This condition may be diagnosed with a physical exam and a review of your medical history. Your health care provider may also take a skin or toenail sample to examine under a microscope. How is this treated? This condition is treated with antifungal medicines. These may be applied as powders, ointments, or creams. In severe cases, an oral antifungal medicine may be given. Follow these instructions at home: Medicines Apply or take over-the-counter and prescription medicines only as told by your health care provider. Apply your  antifungal medicine as told by your health care provider. Do not stop using the antifungal even if your condition improves. Foot care Do not scratch your feet. Keep your feet dry: Wear cotton or wool socks. Change your socks every day or if they become wet. Wear shoes that allow air to flow, such as sandals or canvas tennis shoes. Wash and dry your feet, including the area between your toes. Also, wash and dry your feet: Every day or as told by your health care provider. After exercising. General instructions Do not let others use towels, shoes, nail clippers, or other personal items that touch your feet. Protect your feet by wearing sandals in wet areas, such as locker rooms and shared showers. Keep all follow-up visits. This is important. If you have diabetes, keep your blood sugar under control. Contact a health care provider if: You have a fever. You have swelling, soreness, warmth, or redness in your foot. Your feet are not getting better with treatment. Your symptoms get worse. You have new symptoms. You have severe pain. Summary Athlete's foot (tinea pedis) is a fungal infection of the skin on your feet. It often occurs on skin that is between or underneath the toes. This condition is caused by a fungus that grows in warm, moist places. Symptoms include white, flaky, or scaly areas between your toes or on the soles of your feet. This condition is treated with antifungal medicines. Keep your feet clean. Always dry them thoroughly. This information is not intended to replace advice given to you by   your health care provider. Make sure you discuss any questions you have with your health care provider. Document Revised: 05/20/2020 Document Reviewed: 05/20/2020 Elsevier Patient Education  2023 Elsevier Inc.  

## 2022-06-08 NOTE — Progress Notes (Signed)
Virtual Visit Consent   Jeffrey Chang, you are scheduled for a virtual visit with a [facility] provider today. Just as with appointments in the office, your consent must be obtained to participate. Your consent will be active for this visit and any virtual visit you may have with one of our providers in the next 365 days. If you have a MyChart account, a copy of this consent can be sent to you electronically.  As this is a virtual visit, video technology does not allow for your provider to perform a traditional examination. This may limit your provider's ability to fully assess your condition. If your provider identifies any concerns that need to be evaluated in person or the need to arrange testing (such as labs, EKG, etc.), we will make arrangements to do so. Although advances in technology are sophisticated, we cannot ensure that it will always work on either your end or our end. If the connection with a video visit is poor, the visit may have to be switched to a telephone visit. With either a video or telephone visit, we are not always able to ensure that we have a secure connection.  By engaging in this virtual visit, you consent to the provision of healthcare and authorize for your insurance to be billed (if applicable) for the services provided during this visit. Depending on your insurance coverage, you may receive a charge related to this service.  I need to obtain your verbal consent now. Are you willing to proceed with your visit today? Jeffrey Chang has provided verbal consent on 06/08/2022 for a virtual visit (video or telephone). Jannifer Rodney, FNP  Date: 06/08/2022 7:37 PM  Virtual Visit via Video Note   I, Jannifer Rodney, connected with  Jeffrey Chang  ([id number], [birth year]) on 06/08/22 at  7:30 PM EDT by a video-enabled telemedicine application and verified that I am speaking with the correct person using two identifiers.  Location: Patient: Virtual Visit Location Patient:  car Provider: Virtual Visit Location Provider: Home Office   I discussed the limitations of evaluation and management by telemedicine and the availability of in person appointments. The patient expressed understanding and agreed to proceed.    History of Present Illness: Jeffrey Chang is a 27 y.o. who identifies as a male who was assigned male at birth, and is being seen today for rash on bilateral feet. He had a virtual visit on 04/26/22 and diagnosed with Tinea pedia and given Clotrimazole. He reports he used this for 4 days and lost the rx. His feet are not improved. He wears steal toe boots and his feet sweat a lot. Has skin peeling and itching.   HPI: HPI  Problems:  Patient Active Problem List   Diagnosis Date Noted   Cannabis abuse with psychotic disorder, with delusions (HCC) 07/07/2016    Allergies:  Allergies  Allergen Reactions   Latex    Medications:  Current Outpatient Medications:    clotrimazole (LOTRIMIN) 1 % cream, Apply 1 Application topically 2 (two) times daily., Disp: 30 g, Rfl: 0   terbinafine (LAMISIL) 250 MG tablet, Take 1 tablet (250 mg total) by mouth daily., Disp: 7 tablet, Rfl: 0  Observations/Objective: Patient is well-developed, well-nourished in no acute distress.  Resting comfortably  Head is normocephalic, atraumatic.  No labored breathing.  Speech is clear and coherent with logical content.  Patient is alert and oriented at baseline.    Assessment and Plan: 1. Tinea pedis of both feet - clotrimazole (LOTRIMIN) 1 % cream;  Apply 1 Application topically 2 (two) times daily.  Dispense: 30 g; Refill: 0 - terbinafine (LAMISIL) 250 MG tablet; Take 1 tablet (250 mg total) by mouth daily.  Dispense: 7 tablet; Refill: 0  Keep clean and dry Avoid walking barefoot Wash all socks and shoes  Follow up if symptoms worsen or do not improve   Follow Up Instructions: I discussed the assessment and treatment plan with the patient. The patient was provided  an opportunity to ask questions and all were answered. The patient agreed with the plan and demonstrated an understanding of the instructions.  A copy of instructions were sent to the patient via MyChart unless otherwise noted below.    The patient was advised to call back or seek an in-person evaluation if the symptoms worsen or if the condition fails to improve as anticipated.  Time:  I spent 9 minutes with the patient via telehealth technology discussing the above problems/concerns.    Jannifer Rodney, FNP   .

## 2022-06-22 ENCOUNTER — Telehealth: Payer: MEDICAID | Admitting: Family

## 2022-06-22 DIAGNOSIS — R21 Rash and other nonspecific skin eruption: Secondary | ICD-10-CM

## 2022-06-22 MED ORDER — TRIAMCINOLONE ACETONIDE 0.5 % EX OINT: 1.0000 | TOPICAL_OINTMENT | Freq: Two times a day (BID) | CUTANEOUS | 0 refills | Status: DC

## 2022-06-22 NOTE — Progress Notes (Signed)

## 2022-06-22 NOTE — Progress Notes (Signed)
Can you please attach a photo of your rash and give me some more info on this? Thanks   Jannifer Rodney, FNP

## 2022-06-22 NOTE — Addendum Note (Signed)
Addended by: Jannifer Rodney A on: 06/22/2022 02:01 PM   Modules accepted: Orders

## 2022-07-04 ENCOUNTER — Telehealth: Payer: Self-pay | Admitting: Physician Assistant

## 2022-07-04 DIAGNOSIS — L731 Pseudofolliculitis barbae: Secondary | ICD-10-CM

## 2022-07-04 MED ORDER — CEPHALEXIN 500 MG PO CAPS: 500.0000 mg | ORAL_CAPSULE | Freq: Four times a day (QID) | ORAL | 0 refills | Status: DC

## 2022-07-04 NOTE — Patient Instructions (Signed)
Jeffrey Chang, thank you for joining Jeffrey Loveless, PA-C for today's virtual visit.  While this provider is not your primary care provider (PCP), if your PCP is located in our provider database this encounter information will be shared with them immediately following your visit.   A [facility] MyChart account gives you access to today's visit and all your visits, tests, and labs performed at [facility] " click here if you don't have a [facility] MyChart account or go to [email or web address]  Consent: (Patient) Jeffrey Chang provided verbal consent for this virtual visit at the beginning of the encounter.  Current Medications:  Current Outpatient Medications:    cephALEXin (KEFLEX) 500 MG capsule, Take 1 capsule (500 mg total) by mouth 4 (four) times daily., Disp: 20 capsule, Rfl: 0   clotrimazole (LOTRIMIN) 1 % cream, Apply 1 Application topically 2 (two) times daily., Disp: 30 g, Rfl: 0   terbinafine (LAMISIL) 250 MG tablet, Take 1 tablet (250 mg total) by mouth daily., Disp: 7 tablet, Rfl: 0   triamcinolone ointment (KENALOG) 0.5 %, Apply 1 Application topically 2 (two) times daily., Disp: 30 g, Rfl: 0   Medications ordered in this encounter:  Meds ordered this encounter  Medications   cephALEXin (KEFLEX) 500 MG capsule    Sig: Take 1 capsule (500 mg total) by mouth 4 (four) times daily.    Dispense:  20 capsule    Refill:  0    Order Specific Question:   Supervising Provider    Answer:   Jeffrey Chang [id number]     *If you need refills on other medications prior to your next appointment, please contact your pharmacy*  Follow-Up: Call back or seek an in-person evaluation if the symptoms worsen or if the condition fails to improve as anticipated.  [facility] [phone]  Other Instructions Ingrown Hair  An ingrown hair is a hair that curls and re-enters the skin instead of growing straight out of the skin. An ingrown hair can  develop in any part of the skin that hair is removed from. An ingrown hair may cause small pockets of infection. What are the causes? An ingrown hair may be caused by: Shaving. Tweezing. Waxing. Using a hair removal cream. What increases the risk? You are more likely to develop this condition if you have curly hair. What are the signs or symptoms? Symptoms of an ingrown hair may include: Small bumps on the skin. The bumps may be filled with pus. Pain. Itching. How is this diagnosed? An ingrown hair is diagnosed by a skin exam. How is this treated? Treatment is often not needed unless the ingrown hair has caused an infection. If needed, treatment may include: Applying prescription creams to the skin. This can help with inflammation. Applying warm compresses to the skin. This can help soften the skin. Taking antibiotic medicine. An antibiotic may be prescribed if the infection is severe. Retracting and releasing the ingrown hair tips. Hair removal by electrolysis or laser. Follow these instructions at home: Medicines Take, apply, or use over-the-counter and prescription medicines only as told by your health care provider. This includes any prescription creams. If you were prescribed an antibiotic medicine, take it as told by your health care provider. Do not stop using the antibiotic even if you start to feel better. General instructions Do not shave irritated areas of skin. You may start shaving these areas again once the irritation has gone away. To help remove ingrown hairs on your  face, you may use a facial sponge in a gentle circular motion. Do not pick or squeeze the irritated area of skin as this may cause infection and scarring. Use a hair removal cream as told by your health care provider. Managing pain and swelling  If directed, apply heat to the affected area as often as told by your health care provider. Use the heat source that your health care provider recommends, such  as a moist heat pack or a heating pad. Place a towel between your skin and the heat source. Leave the heat on for 20-30 minutes. If your skin turns bright red, remove the heat right away to prevent burns. The risk of burns is higher if you cannot feel pain, heat, or cold. How is this prevented? Shower before shaving. Wrap areas that you are going to shave in warm, moist wraps for several minutes before shaving. The warmth and moisture help to soften the hairs and makes ingrown hairs less likely. Use thick shaving gels. Use a razor that cuts hair slightly above your skin, or use an electric shaver with a long shave setting. Shave in the direction of hair growth. Avoid making multiple razor strokes. Apply a moisturizing lotion after shaving. Summary An ingrown hair is a hair that curls and re-enters the skin instead of growing straight out of the skin. Treatment is often not needed unless the ingrown hair has caused an infection. Take, apply, or use over-the-counter and prescription medicines only as told by your health care provider. This includes any prescription creams. Do not shave irritated areas of skin. You may start shaving these areas again once the irritation has gone away. If directed, apply heat to the affected area. Use the heat source that your health care provider recommends, such as a moist heat pack or a heating pad. This information is not intended to replace advice given to you by your health care provider. Make sure you discuss any questions you have with your health care provider. Document Revised: 03/20/2021 Document Reviewed: 03/20/2021 Elsevier Patient Education  2024 Elsevier Inc.    If you have been instructed to have an in-person evaluation today at a local Urgent Care facility, please use the link below. It will take you to a list of all of our available [facility], including address, phone number and hours of operation. Please do not delay care.   [facility]  If you or a family member do not have a primary care provider, use the link below to schedule a visit and establish care. When you choose a [facility] primary care physician or advanced practice provider, you gain a long-term partner in health. Find a Primary Care Provider  Learn more about [facility] in-office and virtual care options:  - Get Care Now

## 2022-07-04 NOTE — Progress Notes (Signed)
Virtual Visit Consent   Jeffrey Chang, you are scheduled for a virtual visit with a [facility] provider today. Just as with appointments in the office, your consent must be obtained to participate. Your consent will be active for this visit and any virtual visit you may have with one of our providers in the next 365 days. If you have a MyChart account, a copy of this consent can be sent to you electronically.  As this is a virtual visit, video technology does not allow for your provider to perform a traditional examination. This may limit your provider's ability to fully assess your condition. If your provider identifies any concerns that need to be evaluated in person or the need to arrange testing (such as labs, EKG, etc.), we will make arrangements to do so. Although advances in technology are sophisticated, we cannot ensure that it will always work on either your end or our end. If the connection with a video visit is poor, the visit may have to be switched to a telephone visit. With either a video or telephone visit, we are not always able to ensure that we have a secure connection.  By engaging in this virtual visit, you consent to the provision of healthcare and authorize for your insurance to be billed (if applicable) for the services provided during this visit. Depending on your insurance coverage, you may receive a charge related to this service.  I need to obtain your verbal consent now. Are you willing to proceed with your visit today? Jeffrey Chang has provided verbal consent on 07/04/2022 for a virtual visit (video or telephone). Margaretann Loveless, PA-C  Date: 07/04/2022 4:09 PM  Virtual Visit via Video Note   I, Margaretann Loveless, connected with  Jeffrey Chang  ([id number], [birth year]) on 07/04/22 at  4:00 PM EDT by a video-enabled telemedicine application and verified that I am speaking with the correct person using two identifiers.  Location: Patient: Virtual Visit Location  Patient: Mobile Provider: Virtual Visit Location Provider: Home Office   I discussed the limitations of evaluation and management by telemedicine and the availability of in person appointments. The patient expressed understanding and agreed to proceed.    History of Present Illness: Jeffrey Chang is a 27 y.o. who identifies as a male who was assigned male at birth, and is being seen today for possible ingrown hair.  HPI: HPI   Noticed an ingrown hair or possibly boil in the groin area a few days ago. It is sore to the touch. He tried to pop it on his own. Now it is red, swollen, more tender. Had some purulent discharge but is no longer draining. Has tried warm compresses, epsom salt soaks, ibuprofen. No history of MRSA.  Problems:  Patient Active Problem List   Diagnosis Date Noted   Cannabis abuse with psychotic disorder, with delusions (HCC) 07/07/2016    Allergies:  Allergies  Allergen Reactions   Latex    Medications:  Current Outpatient Medications:    cephALEXin (KEFLEX) 500 MG capsule, Take 1 capsule (500 mg total) by mouth 4 (four) times daily., Disp: 20 capsule, Rfl: 0   clotrimazole (LOTRIMIN) 1 % cream, Apply 1 Application topically 2 (two) times daily., Disp: 30 g, Rfl: 0   terbinafine (LAMISIL) 250 MG tablet, Take 1 tablet (250 mg total) by mouth daily., Disp: 7 tablet, Rfl: 0   triamcinolone ointment (KENALOG) 0.5 %, Apply 1 Application topically 2 (two) times daily., Disp: 30 g, Rfl: 0  Observations/Objective:  Patient is well-developed, well-nourished in no acute distress.  Resting comfortably at home.  Head is normocephalic, atraumatic.  No labored breathing.  Speech is clear and coherent with logical content.  Patient is alert and oriented at baseline.    Assessment and Plan: 1. Ingrown hair - cephALEXin (KEFLEX) 500 MG capsule; Take 1 capsule (500 mg total) by mouth 4 (four) times daily.  Dispense: 20 capsule; Refill: 0  - Keflex for possible ingrown hair  vs skin abscess vs sebaceous cyst - Warm compresses - epsom salt soaks - Ibuprofen and tylenol as needed - Seek in person evaluation if not improving or if worsens  Follow Up Instructions: I discussed the assessment and treatment plan with the patient. The patient was provided an opportunity to ask questions and all were answered. The patient agreed with the plan and demonstrated an understanding of the instructions.  A copy of instructions were sent to the patient via MyChart unless otherwise noted below.    The patient was advised to call back or seek an in-person evaluation if the symptoms worsen or if the condition fails to improve as anticipated.  Time:  I spent 10 minutes with the patient via telehealth technology discussing the above problems/concerns.    Margaretann Loveless, PA-C

## 2022-07-14 ENCOUNTER — Emergency Department (HOSPITAL_BASED_OUTPATIENT_CLINIC_OR_DEPARTMENT_OTHER)
Admission: EM | Admit: 2022-07-14 | Discharge: 2022-07-14 | Disposition: A | Discharge status: Home / Self Care | Payer: Self-pay | Attending: Emergency Medicine | Admitting: Emergency Medicine

## 2022-07-14 ENCOUNTER — Other Ambulatory Visit: Payer: Self-pay

## 2022-07-14 ENCOUNTER — Emergency Department (HOSPITAL_BASED_OUTPATIENT_CLINIC_OR_DEPARTMENT_OTHER): Payer: Self-pay

## 2022-07-14 DIAGNOSIS — M79674 Pain in right toe(s): Secondary | ICD-10-CM | POA: Insufficient documentation

## 2022-07-14 DIAGNOSIS — Z91040 Latex allergy status: Secondary | ICD-10-CM | POA: Insufficient documentation

## 2022-07-14 MED ORDER — CELECOXIB 200 MG PO CAPS: 200.0000 mg | ORAL_CAPSULE | Freq: Two times a day (BID) | ORAL | 0 refills | Status: DC

## 2022-07-14 MED ORDER — TRAMADOL HCL 50 MG PO TABS: 50.0000 mg | ORAL_TABLET | Freq: Four times a day (QID) | ORAL | 0 refills | Status: DC | PRN

## 2022-07-14 NOTE — ED Provider Notes (Signed)
[place] EMERGENCY DEPARTMENT AT [facility] Provider Note   CSN: [id number] Arrival date & time: 07/14/22  1616     History  Chief Complaint  Patient presents with   Foot Pain    Jeffrey Chang is a 27 y.o. male who presents to the ED with chief complaint of right great toe pain x 1.5 weeks. He states the pain is mainly "in the joint" of his toe and sometimes radiates up to his knee. Pain currently 8/10 and is worse after prolonged standing. The patient has taken both ibuprofen and tylenol with no improvement in his symptoms. He mentions wearing steel-toed shoes at work and recalls recently dropping a piece of sheet metal on his foot. He believes this may be the source of his pain. He recalls having a similar pain in his right great toe a few months ago, but it resolved on its own. To note, he was previously told by a doctor at age 37 that he had arthritis.    Foot Pain       Home Medications Prior to Admission medications   Medication Sig Start Date End Date Taking? Authorizing Provider  cephALEXin (KEFLEX) 500 MG capsule Take 1 capsule (500 mg total) by mouth 4 (four) times daily. 07/04/22   Margaretann Loveless, PA-C  clotrimazole (LOTRIMIN) 1 % cream Apply 1 Application topically 2 (two) times daily. 06/08/22   Jannifer Rodney A, FNP  terbinafine (LAMISIL) 250 MG tablet Take 1 tablet (250 mg total) by mouth daily. 06/08/22   Junie Spencer, FNP  triamcinolone ointment (KENALOG) 0.5 % Apply 1 Application topically 2 (two) times daily. 06/22/22   Junie Spencer, FNP      Allergies    Latex    Review of Systems   Review of Systems  Physical Exam Updated Vital Signs BP (!) 151/87 (BP Location: Right Arm)   Pulse 96   Temp 97.7 F (36.5 C) (Temporal)   Resp 18   Ht 6' (1.829 m)   Wt 86.2 kg   SpO2 100%   BMI 25.77 kg/m  Physical Exam Vitals and nursing note reviewed.  Constitutional:      General: He is not in acute distress.    Appearance: He is  well-developed. He is not diaphoretic.  HENT:     Head: Normocephalic and atraumatic.  Eyes:     General: No scleral icterus.    Conjunctiva/sclera: Conjunctivae normal.  Cardiovascular:     Rate and Rhythm: Normal rate and regular rhythm.     Heart sounds: Normal heart sounds.  Pulmonary:     Effort: Pulmonary effort is normal. No respiratory distress.     Breath sounds: Normal breath sounds.  Abdominal:     Palpations: Abdomen is soft.     Tenderness: There is no abdominal tenderness.  Musculoskeletal:     Cervical back: Normal range of motion and neck supple.     Comments: A bilateral foot examination is.  Left foot exam is normal.  Right foot exam shows some tenderness along the extensor hallucis tendon right over the first MTP joint.  There is no swelling, erythema, full range of motion and strength.  Skin:    General: Skin is warm and dry.  Neurological:     Mental Status: He is alert.  Psychiatric:        Behavior: Behavior normal.     ED Results / Procedures / Treatments   Labs (all labs ordered are listed, but only abnormal results  are displayed) Labs Reviewed - No data to display  EKG None  Radiology No results found.  Procedures Procedures    Medications Ordered in ED Medications - No data to display  ED Course/ Medical Decision Making/ A&P                             Medical Decision Making Amount and/or Complexity of Data Reviewed Radiology: ordered.  Risk Prescription drug management.  Here with foot pain, worse after standing for long period of time.  He states that the pain is throbbing and at times he has difficulty sleeping.  He does work all day and steel toed boots and is on his feet for long periods of time he does not appear to have any evidence of gout, septic joint, or ingrown toenail. Will the patient Celebrex, Ultram, RICE therapy, podiatry follow-up. PDMP reviewed during this encounter.         Final Clinical Impression(s)  / ED Diagnoses Final diagnoses:  Toe pain, right    Rx / DC Orders ED Discharge Orders     None         Arthor Captain, PA-C 07/14/22 1849    Ernie Avena, MD 07/14/22 [phone]

## 2022-07-14 NOTE — Discharge Instructions (Signed)
I have ordered Celebrex for pain relief and tramadol.  Please understand that tramadol is a narcotic pain medication.  You should not take this medication if you need to pass a drug test.  Do not take this medication if you are going to drive or make important life decision.  It can make you sleepy.  Sometimes these types of medication can make you nauseated.  Understand that this medication is not going to make you pain-free.  It may take the edge off or decrease your pain significantly making it more comfortable for you to sleep.  I would advise really taking this only at night when needed.  Contact a health care provider if: Your pain does not get better after a few days of treatment at home. Your pain gets worse. You cannot stand on your foot. Your foot or toes are swollen. Your foot is numb or tingling. Get help right away if: Your foot or toes turn white or blue. You have warmth and redness along your foot.

## 2022-07-14 NOTE — ED Triage Notes (Signed)
Patient here POV from Home.  Endorses Right Large Toe Pain for approximately 1 Week states he believes he may have dropped a Tool on it then but is unsure. Radiates to Foot and Lower Leg.  NAD Noted during Triage. A&Ox4. GCS 15. Ambulatory.

## 2022-07-24 ENCOUNTER — Ambulatory Visit
Admission: RE | Admit: 2022-07-24 | Discharge: 2022-07-24 | Disposition: A | Discharge status: Home / Self Care | Payer: Self-pay | Source: Ambulatory Visit | Attending: Family Medicine | Admitting: Family Medicine

## 2022-07-24 ENCOUNTER — Other Ambulatory Visit: Payer: Self-pay

## 2022-07-24 VITALS — BP 134/91 | HR 99 | Temp 97.9°F | Resp 18

## 2022-07-24 DIAGNOSIS — K529 Noninfective gastroenteritis and colitis, unspecified: Secondary | ICD-10-CM

## 2022-07-24 DIAGNOSIS — R070 Pain in throat: Secondary | ICD-10-CM

## 2022-07-24 LAB — POCT RAPID STREP A (OFFICE): Rapid Strep A Screen: NEGATIVE

## 2022-07-24 MED ORDER — ONDANSETRON 4 MG PO TBDP: 4.0000 mg | ORAL_TABLET | Freq: Three times a day (TID) | ORAL | 0 refills | Status: DC | PRN

## 2022-07-24 NOTE — ED Provider Notes (Signed)
[name]    CSN: [id number] Arrival date & time: 07/24/22  1224      History   Chief Complaint Chief Complaint  Patient presents with   Sore Throat    Entered by patient   Emesis   Diarrhea    HPI Jeffrey Chang is a 27 y.o. male.    Sore Throat  Emesis Associated symptoms: diarrhea   Diarrhea Associated symptoms: vomiting    Here for sore throat that began on June 10.  He has not had much cough but maybe a little nasal congestion No fever or chills noted  Yesterday morning he started having nausea and vomiting and diarrhea.  It is improved, but he is still nauseated today.  He did take some Imodium for the diarrhea  No known allergies  Past Medical History:  Diagnosis Date   Bipolar 1 disorder (HCC)    Manic depressive disorder [facility])     Patient Active Problem List   Diagnosis Date Noted   Cannabis abuse with psychotic disorder, with delusions (HCC) 07/07/2016    History reviewed. No pertinent surgical history.     Home Medications    Prior to Admission medications   Medication Sig Start Date End Date Taking? Authorizing Provider  celecoxib (CELEBREX) 200 MG capsule Take 1 capsule (200 mg total) by mouth 2 (two) times daily. 07/14/22  Yes Harris, Abigail, PA-C  ondansetron (ZOFRAN-ODT) 4 MG disintegrating tablet Take 1 tablet (4 mg total) by mouth every 8 (eight) hours as needed for nausea or vomiting. 07/24/22  Yes Xiamara Hulet, Janace Aris, MD  traMADol (ULTRAM) 50 MG tablet Take 1 tablet (50 mg total) by mouth every 6 (six) hours as needed for severe pain. 07/14/22  Yes Arthor Captain, PA-C    Family History History reviewed. No pertinent family history.  Social History Social History   Tobacco Use   Smoking status: Every Day    Types: Cigars   Smokeless tobacco: Never  Vaping Use   Vaping Use: Never used  Substance Use Topics   Alcohol use: Yes    Comment: occ   Drug use: Not Currently     Allergies   Latex   Review of  Systems Review of Systems  Gastrointestinal:  Positive for diarrhea and vomiting.     Physical Exam Triage Vital Signs ED Triage Vitals  Enc Vitals Group     BP 07/24/22 1253 (!) 134/91     Pulse Rate 07/24/22 1253 99     Resp 07/24/22 1253 18     Temp 07/24/22 1253 97.9 F (36.6 C)     Temp src --      SpO2 07/24/22 1253 97 %     Weight --      Height --      Head Circumference --      Peak Flow --      Pain Score 07/24/22 1251 7     Pain Loc --      Pain Edu? --      Excl. in GC? --    No data found.  Updated Vital Signs BP (!) 134/91   Pulse 99   Temp 97.9 F (36.6 C)   Resp 18   SpO2 97%   Visual Acuity Right Eye Distance:   Left Eye Distance:   Bilateral Distance:    Right Eye Near:   Left Eye Near:    Bilateral Near:     Physical Exam Vitals reviewed.  Constitutional:  General: He is not in acute distress.    Appearance: He is not ill-appearing, toxic-appearing or diaphoretic.  HENT:     Right Ear: Tympanic membrane and ear canal normal.     Left Ear: Tympanic membrane and ear canal normal.     Nose: Nose normal.     Mouth/Throat:     Mouth: Mucous membranes are moist.     Comments: There is very mild erythema of the posterior oropharynx.  No asymmetry Eyes:     Extraocular Movements: Extraocular movements intact.     Conjunctiva/sclera: Conjunctivae normal.     Pupils: Pupils are equal, round, and reactive to light.  Cardiovascular:     Rate and Rhythm: Normal rate and regular rhythm.     Heart sounds: No murmur heard. Pulmonary:     Effort: Pulmonary effort is normal. No respiratory distress.     Breath sounds: Normal breath sounds. No stridor. No wheezing, rhonchi or rales.  Musculoskeletal:     Cervical back: Neck supple.  Lymphadenopathy:     Cervical: No cervical adenopathy.  Skin:    Capillary Refill: Capillary refill takes less than 2 seconds.     Coloration: Skin is not jaundiced or pale.  Neurological:     General: No  focal deficit present.     Mental Status: He is alert and oriented to person, place, and time.  Psychiatric:        Behavior: Behavior normal.      UC Treatments / Results  Labs (all labs ordered are listed, but only abnormal results are displayed) Labs Reviewed  POCT RAPID STREP A (OFFICE)    EKG   Radiology No results found.  Procedures Procedures (including critical care time)  Medications Ordered in UC Medications - No data to display  Initial Impression / Assessment and Plan / UC Course  I have reviewed the triage vital signs and the nursing notes.  Pertinent labs & imaging results that were available during my care of the patient were reviewed by me and considered in my medical decision making (see chart for details).        Rapid strep is negative.  I am not going to culture back up.  Zofran is sent in for his nausea. Final Clinical Impressions(s) / UC Diagnoses   Final diagnoses:  Gastroenteritis  Throat pain     Discharge Instructions      Strep test was negative.  Ondansetron dissolved in the mouth every 8 hours as needed for nausea or vomiting. Clear liquids(water, gatorade/pedialyte, ginger ale/sprite, chicken broth/soup) and bland things(crackers/toast, rice, potato, bananas) to eat. Avoid acidic foods like lemon/lime/orange/tomato, and avoid greasy/spicy foods.      ED Prescriptions     Medication Sig Dispense Auth. Provider   ondansetron (ZOFRAN-ODT) 4 MG disintegrating tablet Take 1 tablet (4 mg total) by mouth every 8 (eight) hours as needed for nausea or vomiting. 10 tablet Marlinda Mike Janace Aris, MD      I have reviewed the PDMP during this encounter.   Zenia Resides, MD 07/24/22 1318

## 2022-07-24 NOTE — ED Triage Notes (Signed)
Pt reports Vomiting ,diarrhea and sore throat for 3 days.

## 2022-07-24 NOTE — Discharge Instructions (Signed)
Strep test was negative.  Ondansetron dissolved in the mouth every 8 hours as needed for nausea or vomiting. Clear liquids(water, gatorade/pedialyte, ginger ale/sprite, chicken broth/soup) and bland things(crackers/toast, rice, potato, bananas) to eat. Avoid acidic foods like lemon/lime/orange/tomato, and avoid greasy/spicy foods.

## 2022-07-29 ENCOUNTER — Telehealth: Payer: Self-pay | Admitting: Family Medicine

## 2022-07-29 DIAGNOSIS — J019 Acute sinusitis, unspecified: Secondary | ICD-10-CM

## 2022-07-29 DIAGNOSIS — B9689 Other specified bacterial agents as the cause of diseases classified elsewhere: Secondary | ICD-10-CM

## 2022-07-29 MED ORDER — AMOXICILLIN-POT CLAVULANATE 875-125 MG PO TABS: 1.0000 | ORAL_TABLET | Freq: Two times a day (BID) | ORAL | 0 refills | Status: AC

## 2022-07-29 NOTE — Progress Notes (Signed)

## 2022-08-06 ENCOUNTER — Telehealth: Payer: Self-pay | Admitting: Physician Assistant

## 2022-08-06 DIAGNOSIS — B379 Candidiasis, unspecified: Secondary | ICD-10-CM

## 2022-08-06 DIAGNOSIS — T3695XA Adverse effect of unspecified systemic antibiotic, initial encounter: Secondary | ICD-10-CM

## 2022-08-06 MED ORDER — FLUCONAZOLE 150 MG PO TABS: 150.0000 mg | ORAL_TABLET | Freq: Once | ORAL | 0 refills | Status: AC

## 2022-08-06 NOTE — Progress Notes (Signed)
E-Visit for [employer]  We are sorry that you are not feeling well. Here is how we plan to help!  Based on what you shared with me it looks like you have tinea cruris, or "Jock Itch".  The symptoms of Jock Itch include red, peeling, itchy rash that affects the groin (crease where the leg meets the trunk).  This fungal infection can be spread through shared towels, clothing, bedding, or hard surfaces (particularly in moist areas) such as shower stalls, locker room floors, or pool area that has the fungus present. If you have a fungal infection on one part of your body, you can also spread it to other parts. For instance, men with a fungal infection on their feet sometimes spread it to their groin.  Prescription medications are only indicated for an extensive rash or if over the counter treatments have failed.  I am prescribing:Fluconazole 150 mg once   HOME CARE:  Keep affected area clean, dry, and cool. Wash with soap and shampoo after sports or exercise and dry yourself well after bathing or swimming Wear cotton underwear and change them if they become damp or sweaty. Avoid using swimming pools, public showers, or baths.  GET HELP RIGHT AWAY IF:  Symptoms that don't away after treatment. Severe itching that persists. If your rash spreads or swells. If your rash begins to have drainage or smell. You develop a fever.  MAKE SURE YOU   Understand these instructions. Will watch your condition. Will get help right away if you are not doing well or get worse.  Thank you for choosing an e-visit.  Your e-visit answers were reviewed by a board certified advanced clinical practitioner to complete your personal care plan. Depending upon the condition, your plan could have included both over the counter or prescription medications.  Please review your pharmacy choice. Make sure the pharmacy is open so you can pick up prescription now. If there is a problem, you may contact your provider through  [employer] and have the prescription routed to another pharmacy.  Your safety is important to [place]. If you have drug allergies check your prescription carefully.   For the next 24 hours you can use MyChart to ask questions about today's visit, request a non-urgent call back, or ask for a work or school excuse. You will get an email in the next two days asking about your experience. I hope that your e-visit has been valuable and will speed your recovery.   References or for more information:  [email or web address]=[email or web address]&source=search_result&selectedTitle=3~52&usage_type=default&display_rank=3  I have spent 5 minutes in review of e-visit questionnaire, review and updating patient chart, medical decision making and response to patient.   Margaretann Loveless, PA-C

## 2022-08-12 ENCOUNTER — Telehealth: Payer: Self-pay

## 2022-08-24 ENCOUNTER — Ambulatory Visit: Payer: Self-pay

## 2022-08-24 ENCOUNTER — Telehealth: Payer: Self-pay | Admitting: Nurse Practitioner

## 2022-08-25 ENCOUNTER — Telehealth: Payer: Self-pay | Admitting: Nurse Practitioner

## 2022-08-25 DIAGNOSIS — L0591 Pilonidal cyst without abscess: Secondary | ICD-10-CM

## 2022-08-25 MED ORDER — SULFAMETHOXAZOLE-TRIMETHOPRIM 800-160 MG PO TABS: 1.0000 | ORAL_TABLET | Freq: Two times a day (BID) | ORAL | 0 refills | Status: DC

## 2022-08-25 NOTE — Patient Instructions (Signed)
  To schedule with a primary care provider  [email or web address]

## 2022-08-25 NOTE — Progress Notes (Signed)
Virtual Visit Consent   Jeffrey Chang, you are scheduled for a virtual visit with a [facility] provider today. Just as with appointments in the office, your consent must be obtained to participate. Your consent will be active for this visit and any virtual visit you may have with one of our providers in the next 365 days. If you have a MyChart account, a copy of this consent can be sent to you electronically.  As this is a virtual visit, video technology does not allow for your provider to perform a traditional examination. This may limit your provider's ability to fully assess your condition. If your provider identifies any concerns that need to be evaluated in person or the need to arrange testing (such as labs, EKG, etc.), we will make arrangements to do so. Although advances in technology are sophisticated, we cannot ensure that it will always work on either your end or our end. If the connection with a video visit is poor, the visit may have to be switched to a telephone visit. With either a video or telephone visit, we are not always able to ensure that we have a secure connection.  By engaging in this virtual visit, you consent to the provision of healthcare and authorize for your insurance to be billed (if applicable) for the services provided during this visit. Depending on your insurance coverage, you may receive a charge related to this service.  I need to obtain your verbal consent now. Are you willing to proceed with your visit today? Jeffrey Chang has provided verbal consent on 08/25/2022 for a virtual visit (video or telephone). Viviano Simas, FNP  Date: 08/25/2022 5:58 PM  Virtual Visit via Video Note   I, Viviano Simas, connected with  Jeffrey Chang  ([id number], 1995-11-17) on 08/25/22 at  6:00 PM EDT by a video-enabled telemedicine application and verified that I am speaking with the correct person using two identifiers.  Location: Patient: Virtual Visit Location Patient:  Home Provider: Virtual Visit Location Provider: Home Office   I discussed the limitations of evaluation and management by telemedicine and the availability of in person appointments. The patient expressed understanding and agreed to proceed.    History of Present Illness: Jeffrey Chang is a 27 y.o. who identifies as a male who was assigned male at birth, and is being seen today for complaints of a recurrent pilonidal cyst.  He was most recently treated in January with bactrim   He notices that when he gets got and sweats it seems to aggravate the area   He is currently between insurance  Problems:  Patient Active Problem List   Diagnosis Date Noted   Cannabis abuse with psychotic disorder, with delusions (HCC) 07/07/2016    Allergies:  Allergies  Allergen Reactions   Latex    Medications:  Current Outpatient Medications:    celecoxib (CELEBREX) 200 MG capsule, Take 1 capsule (200 mg total) by mouth 2 (two) times daily., Disp: 20 capsule, Rfl: 0   ondansetron (ZOFRAN-ODT) 4 MG disintegrating tablet, Take 1 tablet (4 mg total) by mouth every 8 (eight) hours as needed for nausea or vomiting., Disp: 10 tablet, Rfl: 0   traMADol (ULTRAM) 50 MG tablet, Take 1 tablet (50 mg total) by mouth every 6 (six) hours as needed for severe pain., Disp: 15 tablet, Rfl: 0  Observations/Objective: Patient is well-developed, well-nourished in no acute distress.  Resting comfortably  at home.  Head is normocephalic, atraumatic.  No labored breathing.  Speech is clear and  coherent with logical content.  Patient is alert and oriented at baseline.    Assessment and Plan: 1. Pilonidal cyst Advised Epson Salt soaks  If no improvement with oral antibiotics he has been instructed to get in person evaluation for possible I&D  Also discussed the recurrent nature of pilonidal cysts and need for surgical consultation.  He is planning to establish with PCP once his insurance starts next month   -  sulfamethoxazole-trimethoprim (BACTRIM DS) 800-160 MG tablet; Take 1 tablet by mouth 2 (two) times daily for 10 days.  Dispense: 20 tablet; Refill: 0     Follow Up Instructions: I discussed the assessment and treatment plan with the patient. The patient was provided an opportunity to ask questions and all were answered. The patient agreed with the plan and demonstrated an understanding of the instructions.  A copy of instructions were sent to the patient via MyChart unless otherwise noted below.    The patient was advised to call back or seek an in-person evaluation if the symptoms worsen or if the condition fails to improve as anticipated.  Time:  I spent 11 minutes with the patient via telehealth technology discussing the above problems/concerns.    Viviano Simas, FNP

## 2022-08-31 ENCOUNTER — Telehealth: Payer: Self-pay | Admitting: Physician Assistant

## 2022-08-31 DIAGNOSIS — T7840XA Allergy, unspecified, initial encounter: Secondary | ICD-10-CM

## 2022-08-31 DIAGNOSIS — J392 Other diseases of pharynx: Secondary | ICD-10-CM

## 2022-08-31 DIAGNOSIS — R0689 Other abnormalities of breathing: Secondary | ICD-10-CM

## 2022-08-31 MED ORDER — PREDNISONE 20 MG PO TABS: 40.0000 mg | ORAL_TABLET | Freq: Every day | ORAL | 0 refills | Status: DC

## 2022-08-31 NOTE — Progress Notes (Signed)
Based on what you shared with me, including symptoms of shortness of breath and feeling like your throat is closing, I feel your condition warrants further evaluation as soon as possible at an Emergency department.    NOTE: There will be NO CHARGE for this eVisit   If you are having a true medical emergency please call 911.      Emergency Department-[facility]  Get Driving Directions  [phone]  [address]  [place], [place] [address]  Open 24/7/365      [facility] Emergency Department at [facility]  Get Driving Directions  [address]  [place], [place] [address]  Open 24/7/365    Emergency Department- [facility]  Get Driving Directions  [phone]  [address]  [place], [place] [address]  Open 24/7/365      [facility] at [facility]  Get Driving Directions  [phone]  [address]  [place], [place] [address]  Open 24/7/365    [facility]  Emergency Department- [facility]  Get Driving Directions  [phone]  [address]  [place], [place] [address]  Open 24/7/365    [place]  Emergency Department- [facility]  Get Driving Directions  [address]  [place], [place] [address]  Open 24/7/365    [facility]  Emergency Department- [facility]  Get Driving Directions  [phone]  [address]  [place], [place] [address]  Open 24/7/365   I have spent 5 minutes in review of e-visit questionnaire, review and updating patient chart, medical decision making and response to patient.   Margaretann Loveless, PA-C

## 2022-08-31 NOTE — Progress Notes (Signed)
Virtual Visit Consent   [name], you are scheduled for a virtual visit with a [facility] provider today. Just as with appointments in the office, your consent must be obtained to participate. Your consent will be active for this visit and any virtual visit you may have with one of our providers in the next 365 days. If you have a MyChart account, a copy of this consent can be sent to you electronically.  As this is a virtual visit, video technology does not allow for your provider to perform a traditional examination. This may limit your provider's ability to fully assess your condition. If your provider identifies any concerns that need to be evaluated in person or the need to arrange testing (such as labs, EKG, etc.), we will make arrangements to do so. Although advances in technology are sophisticated, we cannot ensure that it will always work on either your end or our end. If the connection with a video visit is poor, the visit may have to be switched to a telephone visit. With either a video or telephone visit, we are not always able to ensure that we have a secure connection.  By engaging in this virtual visit, you consent to the provision of healthcare and authorize for your insurance to be billed (if applicable) for the services provided during this visit. Depending on your insurance coverage, you may receive a charge related to this service.  I need to obtain your verbal consent now. Are you willing to proceed with your visit today? Jeffrey Chang has provided verbal consent on 08/31/2022 for a virtual visit (video or telephone). Margaretann Loveless, PA-C  Date: 08/31/2022 7:33 PM  Virtual Visit via Video Note   I, Margaretann Loveless, connected with  Jeffrey Chang  ([id number], April 13, 1995) on 08/31/22 at  7:30 PM EDT by a video-enabled telemedicine application and verified that I am speaking with the correct person using two identifiers.  Location: Patient: Virtual Visit Location  Patient: Home Provider: Virtual Visit Location Provider: Home Office   I discussed the limitations of evaluation and management by telemedicine and the availability of in person appointments. The patient expressed understanding and agreed to proceed.    History of Present Illness: Jeffrey Chang is a 27 y.o. who identifies as a male who was assigned male at birth, and is being seen today for possible allergic reaction. Patient has a latex allergy and was served food that the food worker wore latex gloves. He does feel some swelling of his face with mild sensation of throat fullness. He is not having any shortness of breath. He has tried Claritin without improvement. Exposure to latex was yesterday. Symptoms have not worsened,  just have not improved.   Problems:  Patient Active Problem List   Diagnosis Date Noted   Cannabis abuse with psychotic disorder, with delusions (HCC) 07/07/2016    Allergies:  Allergies  Allergen Reactions   Latex    Medications:  Current Outpatient Medications:    predniSONE (DELTASONE) 20 MG tablet, Take 2 tablets (40 mg total) by mouth daily with breakfast., Disp: 10 tablet, Rfl: 0   celecoxib (CELEBREX) 200 MG capsule, Take 1 capsule (200 mg total) by mouth 2 (two) times daily., Disp: 20 capsule, Rfl: 0   ondansetron (ZOFRAN-ODT) 4 MG disintegrating tablet, Take 1 tablet (4 mg total) by mouth every 8 (eight) hours as needed for nausea or vomiting., Disp: 10 tablet, Rfl: 0   sulfamethoxazole-trimethoprim (BACTRIM DS) 800-160 MG tablet, Take 1 tablet by  mouth 2 (two) times daily for 10 days., Disp: 20 tablet, Rfl: 0   traMADol (ULTRAM) 50 MG tablet, Take 1 tablet (50 mg total) by mouth every 6 (six) hours as needed for severe pain., Disp: 15 tablet, Rfl: 0  Observations/Objective: Patient is well-developed, well-nourished in no acute distress.  Resting comfortably at home.  Head is normocephalic, atraumatic.  No labored breathing.  Speech is clear and  coherent with logical content.  Patient is alert and oriented at baseline.  Patient was smoking a cigar during call so no SOB or difficulty breathing concerns  Assessment and Plan: 1. Allergic reaction, initial encounter - predniSONE (DELTASONE) 20 MG tablet; Take 2 tablets (40 mg total) by mouth daily with breakfast.  Dispense: 10 tablet; Refill: 0  - No warning signs of acute anaphylaxis - Prednisone prescribed - Benadryl  - Can add Pepcid (famotidine) 20mg  twice daily if needed - Seek in person evaluation if symptoms worsen or fail to improve  Follow Up Instructions: I discussed the assessment and treatment plan with the patient. The patient was provided an opportunity to ask questions and all were answered. The patient agreed with the plan and demonstrated an understanding of the instructions.  A copy of instructions were sent to the patient via MyChart unless otherwise noted below.    The patient was advised to call back or seek an in-person evaluation if the symptoms worsen or if the condition fails to improve as anticipated.  Time:  I spent 10 minutes with the patient via telehealth technology discussing the above problems/concerns.    Margaretann Loveless, PA-C

## 2022-08-31 NOTE — Patient Instructions (Signed)
Jeffrey Chang, thank you for joining Margaretann Loveless, PA-C for today's virtual visit.  While this provider is not your primary care provider (PCP), if your PCP is located in our provider database this encounter information will be shared with them immediately following your visit.   A [facility] MyChart account gives you access to today's visit and all your visits, tests, and labs performed at [facility] " click here if you don't have a [facility] MyChart account or go to [email or web address]  Consent: (Patient) Jeffrey Chang provided verbal consent for this virtual visit at the beginning of the encounter.  Current Medications:  Current Outpatient Medications:    predniSONE (DELTASONE) 20 MG tablet, Take 2 tablets (40 mg total) by mouth daily with breakfast., Disp: 10 tablet, Rfl: 0   celecoxib (CELEBREX) 200 MG capsule, Take 1 capsule (200 mg total) by mouth 2 (two) times daily., Disp: 20 capsule, Rfl: 0   ondansetron (ZOFRAN-ODT) 4 MG disintegrating tablet, Take 1 tablet (4 mg total) by mouth every 8 (eight) hours as needed for nausea or vomiting., Disp: 10 tablet, Rfl: 0   sulfamethoxazole-trimethoprim (BACTRIM DS) 800-160 MG tablet, Take 1 tablet by mouth 2 (two) times daily for 10 days., Disp: 20 tablet, Rfl: 0   traMADol (ULTRAM) 50 MG tablet, Take 1 tablet (50 mg total) by mouth every 6 (six) hours as needed for severe pain., Disp: 15 tablet, Rfl: 0   Medications ordered in this encounter:  Meds ordered this encounter  Medications   predniSONE (DELTASONE) 20 MG tablet    Sig: Take 2 tablets (40 mg total) by mouth daily with breakfast.    Dispense:  10 tablet    Refill:  0    Order Specific Question:   Supervising Provider    Answer:   Merrilee Jansky [id number]     *If you need refills on other medications prior to your next appointment, please contact your pharmacy*  Follow-Up: Call back or seek an in-person evaluation if the symptoms worsen or if  the condition fails to improve as anticipated.  [facility] [phone]  Other Instructions Allergies, Adult An allergy is a condition that causes the body's defense system (immune system) to react too strongly to an allergen. An allergen is a substance that is harmless to most people but can cause a reaction in some people. Allergies often affect the nose (allergic rhinitis), eyes (conjunctivitis), skin (atopic dermatitis), and stomach. They can be mild, moderate, or severe. They cannot spread from person to person. Allergies can start at any age. In some cases, they may go away as you get older. What are the causes? Allergies are caused by allergens. These may be: Outdoor allergens. These include pollen, car fumes, and mold. Indoor allergens. These include dust, smoke, mold, and pet dander. Other allergens. These include foods, medicines, scents, and insect bites or stings. What increases the risk? You are more likely to have allergies if you have: Family members with allergies. Family members who have a condition that may be caused by allergens, such as asthma. What are the signs or symptoms? Symptoms depend on how severe your allergy is. Mild to moderate symptoms Runny nose, stuffy nose (nasal congestion), or sneezing. Itchy mouth, ears, or throat. Postnasal drip. This is a feeling of mucus dripping down the back of your throat. Sore throat. Itchy, red, watery, or puffy eyes. Skin rash, or itchy, red, swollen areas of skin (hives). Stomach cramps or bloating. Severe symptoms A bad  allergy to food, medicine, or insect bites may cause a severe reaction (anaphylactic reaction). Symptoms include: A red face. Coughing or making high-pitched whistling sounds when you breathe, most often when you breathe out (wheezing). Swollen lips, tongue, or mouth. A tight or swollen throat. Chest pain or tightness, or a fast heartbeat. Trouble breathing or shortness of breath. Pain  in your abdomen. Vomiting or diarrhea. Feeling dizzy or fainting. How is this diagnosed? Allergies are diagnosed based on your symptoms, your family and medical history, and a physical exam. You may also have tests, such as: Skin tests. These may be done to see how your skin reacts to allergens. Tests include: Skin prick test. For this test, the allergen is put in your body through a small prick in the skin. Intradermal skin test. For this test, a small amount of the allergen is put under the first layer of your skin. Patch test. For this test, a small amount of the allergen is placed on your skin. The area is covered and then checked after a few days. Blood tests. A challenge test. For this test, you eat or breathe in the allergen to see if you have a reaction. You may also be asked to: Keep a food diary. This means writing down all the foods, drinks, and symptoms you have in a day. Try an elimination diet. To do this: Stop eating certain foods. Add those foods back one by one to find out if any of them cause a reaction. How is this treated?     Treatment for allergies depends on your symptoms. It may include: Cold, wet cloths (cold compresses). These can be used to soothe itching and swelling. Eye drops or nasal sprays. A saline solution to clear out your nose and keep it moist (nasal irrigation). A saline solution is made of salt and water. A humidifier. This can add moisture to the air. Skin creams. These can treat rashes or itching. Diet changes to cut out foods that cause allergies. Being exposed again and again to tiny amounts of allergens. This can help your body build a defense against them (tolerance). This process is called immunotherapy. It may be done using: Allergy shots. This is when you get a shot of the allergen. Sublingual immunotherapy. This is when you take a small dose of the allergen under your tongue. Allergy medicines (antihistamines) or other medicines. These  can help block the allergic reaction. Using an auto-injector pen. An auto-injector pen is a device filled with medicine that gives an emergency shot of epinephrine. Your health care provider will teach you how to use it. Follow these instructions at home: Medicines  Take or apply over-the-counter and prescription medicines only as told by your provider. Always carry your auto-injector pen if you are at risk of an anaphylactic reaction. Give yourself the shot as told by your provider. Eating and drinking Follow instructions from your provider about what you may eat and drink. Drink enough fluid to keep your pee (urine) pale yellow. General instructions Wear a medical alert bracelet or necklace if you have had an anaphylactic reaction in the past. Avoid known allergens when you can. Keep all follow-up visits. Your provider will watch your symptoms and talk about treatment options with you. Contact a health care provider if: Your symptoms do not get better with treatment. Get help right away if: You have any symptoms of anaphylactic reaction. You use an auto-injector pen. You will need more medical care even if the medicine seems to  be working. An anaphylactic reaction may happen again within 72 hours (rebound anaphylaxis). These symptoms may be an emergency. Use the auto-injector pen right away. Then call 911. Do not wait to see if the symptoms will go away. Do not drive yourself to the hospital. This information is not intended to replace advice given to you by your health care provider. Make sure you discuss any questions you have with your health care provider. Document Revised: 10/09/2021 Document Reviewed: 10/09/2021 Elsevier Patient Education  2024 Elsevier Inc.    If you have been instructed to have an in-person evaluation today at a local Urgent Care facility, please use the link below. It will take you to a list of all of our available [facility], including address,  phone number and hours of operation. Please do not delay care.  [facility]  If you or a family member do not have a primary care provider, use the link below to schedule a visit and establish care. When you choose a [facility] primary care physician or advanced practice provider, you gain a long-term partner in health. Find a Primary Care Provider  Learn more about [facility]'s in-office and virtual care options: [facility] - Get Care Now

## 2022-09-02 ENCOUNTER — Encounter (HOSPITAL_COMMUNITY): Payer: Self-pay

## 2022-09-02 ENCOUNTER — Ambulatory Visit (HOSPITAL_COMMUNITY)
Admission: RE | Admit: 2022-09-02 | Discharge: 2022-09-02 | Disposition: A | Discharge status: Home / Self Care | Payer: Self-pay | Source: Ambulatory Visit | Attending: Family Medicine | Admitting: Family Medicine

## 2022-09-02 VITALS — BP 142/84 | HR 83 | Temp 98.4°F | Resp 16

## 2022-09-02 DIAGNOSIS — R350 Frequency of micturition: Secondary | ICD-10-CM | POA: Insufficient documentation

## 2022-09-02 DIAGNOSIS — K4020 Bilateral inguinal hernia, without obstruction or gangrene, not specified as recurrent: Secondary | ICD-10-CM | POA: Insufficient documentation

## 2022-09-02 DIAGNOSIS — M545 Low back pain, unspecified: Secondary | ICD-10-CM | POA: Insufficient documentation

## 2022-09-02 LAB — POCT URINALYSIS DIP (MANUAL ENTRY)
Bilirubin, UA: NEGATIVE
Glucose, UA: NEGATIVE mg/dL
Ketones, POC UA: NEGATIVE mg/dL
Leukocytes, UA: NEGATIVE
Nitrite, UA: NEGATIVE
Protein Ur, POC: NEGATIVE mg/dL
Spec Grav, UA: 1.02 (ref 1.010–1.025)
Urobilinogen, UA: 0.2 E.U./dL
pH, UA: 7 (ref 5.0–8.0)

## 2022-09-02 LAB — POCT FASTING CBG KUC MANUAL ENTRY: POCT Glucose (KUC): 100 mg/dL — AB (ref 70–99)

## 2022-09-02 MED ORDER — METHOCARBAMOL 500 MG PO TABS: 500.0000 mg | ORAL_TABLET | Freq: Two times a day (BID) | ORAL | 0 refills | Status: DC

## 2022-09-02 NOTE — ED Provider Notes (Signed)
[facility]    CSN: [id number] Arrival date & time: 09/02/22  1615      History   Chief Complaint Chief Complaint  Patient presents with   Back Pain    HPI Jeffrey Chang is a 27 y.o. male.   Patient presents today with a prolonged history of lower back/abdominal pain.  He reports that this pain radiates into his groin on both sides.  Pain is rated 7/8 on a 0-10 pain scale, described as a pressure/aching, no alleviating factors identified.  He has tried Tylenol without improvement of symptoms.  Does report he has associated urinary frequency but denies any dysuria, penile discharge, genital lesion, fever, nausea, vomiting.  He denies any personal history of diabetes.  He has no specific discern for STI but is open to testing.  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  This does tend to exacerbate his symptoms.  He denies formal diagnosis of a hernia and has never had hernia repair.  He does report that pain sometimes will travel towards his testicle but denies any ongoing testicular pain or swelling.    Past Medical History:  Diagnosis Date   Bipolar 1 disorder (HCC)    Manic depressive disorder [facility]     Patient Active Problem List   Diagnosis Date Noted   Cannabis abuse with psychotic disorder, with delusions (HCC) 07/07/2016    History reviewed. No pertinent surgical history.     Home Medications    Prior to Admission medications   Medication Sig Start Date End Date Taking? Authorizing Provider  methocarbamol (ROBAXIN) 500 MG tablet Take 1 tablet (500 mg total) by mouth 2 (two) times daily. 09/02/22  Yes Jyla Hopf, Noberto Retort, PA-C  predniSONE (DELTASONE) 20 MG tablet Take 2 tablets (40 mg total) by mouth daily with breakfast. 08/31/22   Margaretann Loveless, PA-C    Family History History reviewed. No pertinent family history.  Social History Social History   Tobacco Use   Smoking status: Every Day     Types: Cigars   Smokeless tobacco: Never  Vaping Use   Vaping status: Never Used  Substance Use Topics   Alcohol use: Yes    Comment: occ   Drug use: Not Currently     Allergies   Latex   Review of Systems Review of Systems  Constitutional:  Positive for activity change. Negative for appetite change, fatigue and fever.  Gastrointestinal:  Positive for abdominal pain. Negative for blood in stool, constipation, diarrhea, nausea and vomiting.  Genitourinary:  Positive for frequency. Negative for dysuria, penile discharge, penile pain, penile swelling, scrotal swelling, testicular pain and urgency.  Musculoskeletal:  Positive for back pain. Negative for arthralgias and myalgias.     Physical Exam Triage Vital Signs ED Triage Vitals [09/02/22 1642]  Encounter Vitals Group     BP (!) 142/84     Systolic BP Percentile      Diastolic BP Percentile      Pulse Rate 83     Resp 16     Temp 98.4 F (36.9 C)     Temp Source Oral     SpO2 97 %     Weight      Height      Head Circumference      Peak Flow      Pain Score 7     Pain Loc      Pain Education  Exclude from Growth Chart    No data found.  Updated Vital Signs BP (!) 142/84 (BP Location: Left Arm)   Pulse 83   Temp 98.4 F (36.9 C) (Oral)   Resp 16   SpO2 97%   Visual Acuity Right Eye Distance:   Left Eye Distance:   Bilateral Distance:    Right Eye Near:   Left Eye Near:    Bilateral Near:     Physical Exam Vitals reviewed. Exam conducted with a chaperone present.  Constitutional:      General: He is awake.     Appearance: Normal appearance. He is well-developed. He is not ill-appearing.     Comments: Very pleasant male appears stated age in no acute distress sitting comfortably in exam room  HENT:     Head: Normocephalic and atraumatic.     Mouth/Throat:     Pharynx: Uvula midline. No oropharyngeal exudate or posterior oropharyngeal erythema.  Cardiovascular:     Rate and Rhythm: Normal  rate and regular rhythm.     Heart sounds: Normal heart sounds, S1 normal and S2 normal. No murmur heard. Pulmonary:     Effort: Pulmonary effort is normal.     Breath sounds: Normal breath sounds. No stridor. No wheezing, rhonchi or rales.     Comments: Clear to auscultation bilaterally Abdominal:     General: Bowel sounds are normal.     Palpations: Abdomen is soft.     Tenderness: There is no abdominal tenderness. There is no right CVA tenderness, left CVA tenderness, guarding or rebound.     Hernia: A hernia is present. Hernia is present in the left inguinal area and right inguinal area.  Genitourinary:    Penis: Normal and circumcised.      Testes: Normal.     Comments: Leisha, CMA present as chaperone during exam. Musculoskeletal:     Cervical back: No tenderness or bony tenderness.     Thoracic back: Tenderness present. No bony tenderness.     Lumbar back: Tenderness present. No bony tenderness.  Neurological:     Mental Status: He is alert.  Psychiatric:        Behavior: Behavior is cooperative.      UC Treatments / Results  Labs (all labs ordered are listed, but only abnormal results are displayed) Labs Reviewed  POCT URINALYSIS DIP (MANUAL ENTRY) - Abnormal; Notable for the following components:      Result Value   Clarity, UA cloudy (*)    Blood, UA trace-intact (*)    All other components within normal limits  POCT FASTING CBG [facility] MANUAL ENTRY - Abnormal; Notable for the following components:   POCT Glucose ([facility]) 100 (*)    All other components within normal limits  CYTOLOGY, (ORAL, ANAL, URETHRAL) ANCILLARY ONLY    EKG   Radiology No results found.  Procedures Procedures (including critical care time)  Medications Ordered in UC Medications - No data to display  Initial Impression / Assessment and Plan / UC Course  I have reviewed the triage vital signs and the nursing notes.  Pertinent labs & imaging results that were available during my care of  the patient were reviewed by me and considered in my medical decision making (see chart for details).     Patient is well-appearing, afebrile, nontoxic, nontachycardic.  Vital signs of physical exam are reassuring today.  UA was obtained that showed no evidence of infection or glucosuria.  Random blood sugar was appropriate.  STI testing was  obtained and is pending.  He was noted to have a hernia on exam.  He did not have any significant testicular or epididymis discomfort.  We discussed that hernia could be contributing to his symptoms given his clinical presentation though I am also concerned for musculoskeletal etiology of the lower back pain given tenderness on exam.  He was sent in methocarbamol to help manage muscular pain with instruction not to drive or drink alcohol with this medication as drowsiness is a common side effect.  Discussed that ultimately he would need to see a surgeon to consider imaging and possible surgical repair of hernia and was given contact information for local provider.  He does not currently have a primary care provider so will try to establish him with one via PCP assistance.  We discussed that if at any point he has worsening symptoms including severe abdominal pain, difficulty passing stool or urine, swelling of his testicle, painful and discolored bulge in his groin he needs to be seen immediately in the emergency room.  Strict return precautions given.  Work excuse note provided.  Final Clinical Impressions(s) / UC Diagnoses   Final diagnoses:  Non-recurrent bilateral inguinal hernia without obstruction or gangrene  Acute bilateral low back pain without sciatica  Urinary frequency     Discharge Instructions      I am concerned that you have a hernia that is contributing to your symptoms.  I would recommend following up with a surgeon.  We will also try to establish you with a primary care for someone to call you to schedule an appointment.  Your urine was  essentially normal and your blood sugar was appropriate.  I have called in Robaxin to help with some of the muscular pain in your lower back.  This make you sleepy so do not drive or drink alcohol while taking it.  If you have any worsening symptoms including increasing abdominal pain, nausea/vomiting, difficulty passing bowel movements, swelling in your testicles should be seen immediately.     ED Prescriptions     Medication Sig Dispense Auth. Provider   methocarbamol (ROBAXIN) 500 MG tablet Take 1 tablet (500 mg total) by mouth 2 (two) times daily. 20 tablet Latrel Szymczak, Noberto Retort, PA-C      PDMP not reviewed this encounter.   Jeani Hawking, PA-C 09/02/22 1755

## 2022-09-02 NOTE — Discharge Instructions (Addendum)
I am concerned that you have a hernia that is contributing to your symptoms.  I would recommend following up with a surgeon.  We will also try to establish you with a primary care for someone to call you to schedule an appointment.  Your urine was essentially normal and your blood sugar was appropriate.  I have called in Robaxin to help with some of the muscular pain in your lower back.  This make you sleepy so do not drive or drink alcohol while taking it.  If you have any worsening symptoms including increasing abdominal pain, nausea/vomiting, difficulty passing bowel movements, swelling in your testicles should be seen immediately.

## 2022-09-02 NOTE — ED Triage Notes (Signed)
Patient reports that he began having mid lower back pain that radiates into the mid back, down the buttocks into both groin areas x 3 days. Patient  also reports the pain radiates into the lower abdomen at times. Patient states he does " a lot of bending at work". Patient denies heavy lifting or injury to his back.  Patient states he has "an urge to pee all the time." X 1 week.  Patient states that he has been taking Ibuprofen and a Tylenol 650 mg at 1200 today at work.

## 2022-09-03 LAB — CYTOLOGY, (ORAL, ANAL, URETHRAL) ANCILLARY ONLY
Chlamydia: NEGATIVE
Comment: NEGATIVE
Comment: NEGATIVE
Comment: NORMAL
Neisseria Gonorrhea: NEGATIVE
Trichomonas: NEGATIVE

## 2022-09-04 ENCOUNTER — Ambulatory Visit
Admission: EM | Admit: 2022-09-04 | Discharge: 2022-09-04 | Disposition: A | Discharge status: Home / Self Care | Payer: Self-pay | Attending: Physician Assistant | Admitting: Physician Assistant

## 2022-09-04 DIAGNOSIS — Z1152 Encounter for screening for COVID-19: Secondary | ICD-10-CM | POA: Insufficient documentation

## 2022-09-04 DIAGNOSIS — J069 Acute upper respiratory infection, unspecified: Secondary | ICD-10-CM | POA: Insufficient documentation

## 2022-09-04 NOTE — ED Triage Notes (Signed)
"  I just want to UC and was having a little bit of symptoms of sore throat, some congestion in my chest with occasional cough today". No fever. Some runny nose.

## 2022-09-04 NOTE — ED Provider Notes (Signed)
[name]    CSN: [id number] Arrival date & time: 09/04/22  1721      History   Chief Complaint Chief Complaint  Patient presents with   Cough   Sore Throat    HPI Jeffrey Chang is a 27 y.o. male.   Patient here today for evaluation of sore throat, congestion and occasional cough.  He reports that symptoms have seemed to worsen today. He has had nasal congestion and drainage. He reports possible fever at times. He has not measured his temperature. He has not had vomiting or diarrhea. He has tried OTC med without resolution.   The history is provided by the patient.  Cough Associated symptoms: chills, ear pain, fever and sore throat   Associated symptoms: no eye discharge and no shortness of breath   Sore Throat Pertinent negatives include no abdominal pain and no shortness of breath.    Past Medical History:  Diagnosis Date   Bipolar 1 disorder (HCC)    Manic depressive disorder [facility])     Patient Active Problem List   Diagnosis Date Noted   Cannabis abuse with psychotic disorder, with delusions (HCC) 07/07/2016    History reviewed. No pertinent surgical history.     Home Medications    Prior to Admission medications   Medication Sig Start Date End Date Taking? Authorizing Provider  methocarbamol (ROBAXIN) 500 MG tablet Take 1 tablet (500 mg total) by mouth 2 (two) times daily. 09/02/22  Yes Raspet, Noberto Retort, PA-C  predniSONE (DELTASONE) 20 MG tablet Take 2 tablets (40 mg total) by mouth daily with breakfast. 08/31/22  Yes Burnette, Alessandra Bevels, PA-C    Family History History reviewed. No pertinent family history.  Social History Social History   Tobacco Use   Smoking status: Every Day    Types: Cigars   Smokeless tobacco: Never  Vaping Use   Vaping status: Never Used  Substance Use Topics   Alcohol use: Yes    Comment: Occassionally.   Drug use: Not Currently     Allergies   Latex   Review of Systems Review of Systems   Constitutional:  Positive for chills and fever.  HENT:  Positive for congestion, ear pain and sore throat.   Eyes:  Negative for discharge and redness.  Respiratory:  Positive for cough. Negative for shortness of breath.   Gastrointestinal:  Positive for nausea. Negative for abdominal pain, diarrhea and vomiting.     Physical Exam Triage Vital Signs ED Triage Vitals  Encounter Vitals Group     BP 09/04/22 1748 (!) 161/90     Systolic BP Percentile --      Diastolic BP Percentile --      Pulse Rate 09/04/22 1748 88     Resp 09/04/22 1748 18     Temp 09/04/22 1748 99.2 F (37.3 C)     Temp Source 09/04/22 1748 Oral     SpO2 09/04/22 1748 98 %     Weight 09/04/22 1746 187 lb (84.8 kg)     Height 09/04/22 1746 6' (1.829 m)     Head Circumference --      Peak Flow --      Pain Score 09/04/22 1746 0     Pain Loc --      Pain Education --      Exclude from Growth Chart --    No data found.  Updated Vital Signs BP (!) 144/92 (BP Location: Left Arm)   Pulse 88   Temp  99.2 F (37.3 C) (Oral)   Resp 18   Ht 6' (1.829 m)   Wt 187 lb (84.8 kg)   SpO2 98%   BMI 25.36 kg/m     Physical Exam Vitals and nursing note reviewed.  Constitutional:      General: He is not in acute distress.    Appearance: Normal appearance. He is not ill-appearing.  HENT:     Head: Normocephalic and atraumatic.     Right Ear: Tympanic membrane normal.     Left Ear: Tympanic membrane normal.     Nose: Congestion present.     Mouth/Throat:     Mouth: Mucous membranes are moist.     Pharynx: Oropharynx is clear. No oropharyngeal exudate or posterior oropharyngeal erythema.  Eyes:     Conjunctiva/sclera: Conjunctivae normal.  Cardiovascular:     Rate and Rhythm: Normal rate and regular rhythm.     Heart sounds: Normal heart sounds. No murmur heard. Pulmonary:     Effort: Pulmonary effort is normal. No respiratory distress.     Breath sounds: Normal breath sounds. No wheezing, rhonchi or rales.   Skin:    General: Skin is warm and dry.  Neurological:     Mental Status: He is alert.  Psychiatric:        Mood and Affect: Mood normal.        Thought Content: Thought content normal.      UC Treatments / Results  Labs (all labs ordered are listed, but only abnormal results are displayed) Labs Reviewed  SARS CORONAVIRUS 2 (TAT 6-24 HRS)    EKG   Radiology No results found.  Procedures Procedures (including critical care time)  Medications Ordered in UC Medications - No data to display  Initial Impression / Assessment and Plan / UC Course  I have reviewed the triage vital signs and the nursing notes.  Pertinent labs & imaging results that were available during my care of the patient were reviewed by me and considered in my medical decision making (see chart for details).    Suspect viral etiology of symptoms.  Will order COVID screening for further evaluation.  Recommended symptomatic treatment, increase fluids and rest.  Patient declines work note.  Encouraged follow-up if symptoms fail to improve over the next several days or worsen in any way.  Final Clinical Impressions(s) / UC Diagnoses   Final diagnoses:  Acute upper respiratory infection   Discharge Instructions   None    ED Prescriptions   None    PDMP not reviewed this encounter.   Tomi Bamberger, PA-C 09/04/22 [phone]

## 2022-09-09 ENCOUNTER — Telehealth: Payer: Self-pay | Admitting: Physician Assistant

## 2022-09-09 DIAGNOSIS — H1033 Unspecified acute conjunctivitis, bilateral: Secondary | ICD-10-CM

## 2022-09-09 MED ORDER — POLYMYXIN B-TRIMETHOPRIM 10000-0.1 UNIT/ML-% OP SOLN
OPHTHALMIC | 0 refills | Status: DC
Start: 2022-09-09 — End: 2022-10-22

## 2022-09-09 NOTE — Patient Instructions (Signed)
[name], thank you for joining [name], PA-C for today's virtual visit.  While this provider is not your primary care provider (PCP), if your PCP is located in our provider database this encounter information will be shared with them immediately following your visit.   A [facility] MyChart account gives you access to today's visit and all your visits, tests, and labs performed at [facility] " click here if you don't have a [facility] MyChart account or go to [email or web address]  Consent: (Patient) Jeffrey Chang provided verbal consent for this virtual visit at the beginning of the encounter.  Current Medications:  Current Outpatient Medications:    trimethoprim-polymyxin b (POLYTRIM) ophthalmic solution, Apply 1-2 drops into affected eye QID x 5 days., Disp: 10 mL, Rfl: 0   methocarbamol (ROBAXIN) 500 MG tablet, Take 1 tablet (500 mg total) by mouth 2 (two) times daily., Disp: 20 tablet, Rfl: 0   predniSONE (DELTASONE) 20 MG tablet, Take 2 tablets (40 mg total) by mouth daily with breakfast., Disp: 10 tablet, Rfl: 0   Medications ordered in this encounter:  Meds ordered this encounter  Medications   trimethoprim-polymyxin b (POLYTRIM) ophthalmic solution    Sig: Apply 1-2 drops into affected eye QID x 5 days.    Dispense:  10 mL    Refill:  0    Order Specific Question:   Supervising Provider    Answer:   Merrilee Jansky [[id number]]     *If you need refills on other medications prior to your next appointment, please contact your pharmacy*  Follow-Up: Call back or seek an in-person evaluation if the symptoms worsen or if the condition fails to improve as anticipated.  [facility] [phone]  Other Instructions Bacterial Conjunctivitis, Adult Bacterial conjunctivitis is an infection of your conjunctiva. This is the clear membrane that covers the white part of your eye and the inner part of your eyelid. This infection can  make your eye: Red or pink. Itchy or irritated. This condition spreads easily from person to person (is contagious) and from one eye to the other eye. What are the causes? This condition is caused by germs (bacteria). You may get the infection if you come into close contact with: A person who has the infection. Items that have germs on them (are contaminated), such as face towels, contact lens solution, or eye makeup. What increases the risk? You are more likely to get this condition if: You have contact with people who have the infection. You wear contact lenses. You have a sinus infection. You have had a recent eye injury or surgery. You have a weak body defense system (immune system). You have dry eyes. What are the signs or symptoms?  Thick, yellowish discharge from the eye. Tearing or watery eyes. Itchy eyes. Burning feeling in your eyes. Eye redness. Swollen eyelids. Blurred vision. How is this treated?  Antibiotic eye drops or ointment. Antibiotic medicine taken by mouth. This is used for infections that do not get better with drops or ointment or that last more than 10 days. Cool, wet cloths placed on the eyes. Artificial tears used 2-6 times a day. Follow these instructions at home: Medicines Take or apply your antibiotic medicine as told by your doctor. Do not stop using it even if you start to feel better. Take or apply over-the-counter and prescription medicines only as told by your doctor. Do not touch your eyelid with the eye-drop bottle or the ointment tube. Managing discomfort  Wipe any fluid from your eye with a warm, wet washcloth or a cotton ball. Place a clean, cool, wet cloth on your eye. Do this for 10-20 minutes, 3-4 times a day. General instructions Do not wear contacts until the infection is gone. Wear glasses until your doctor says it is okay to wear contacts again. Do not wear eye makeup until the infection is gone. Throw away old eye makeup. Change  or wash your pillowcase every day. Do not share towels or washcloths. Wash your hands often with soap and water for at least 20 seconds and especially before touching your face or eyes. Use paper towels to dry your hands. Do not touch or rub your eyes. Do not drive or use heavy machinery if your vision is blurred. Contact a doctor if: You have a fever. You do not get better after 10 days. Get help right away if: You have a fever and your symptoms get worse all of a sudden. You have very bad pain when you move your eye. Your face: Hurts. Is red. Is swollen. You have sudden loss of vision. Summary Bacterial conjunctivitis is an infection of your conjunctiva. This infection spreads easily from person to person. Wash your hands often with soap and water for at least 20 seconds and especially before touching your face or eyes. Use paper towels to dry your hands. Take or apply your antibiotic medicine as told by your doctor. Contact a doctor if you have a fever or you do not get better after 10 days. This information is not intended to replace advice given to you by your health care provider. Make sure you discuss any questions you have with your health care provider. Document Revised: 05/09/2020 Document Reviewed: 05/09/2020 Elsevier Patient Education  2024 Elsevier Inc.    If you have been instructed to have an in-person evaluation today at a local Urgent Care facility, please use the link below. It will take you to a list of all of our available [facility], including address, phone number and hours of operation. Please do not delay care.  [facility]  If you or a family member do not have a primary care provider, use the link below to schedule a visit and establish care. When you choose a [facility] primary care physician or advanced practice provider, you gain a long-term partner in health. Find a Primary Care Provider  Learn more about [facility]'s  in-office and virtual care options:  - Get Care Now

## 2022-09-09 NOTE — Progress Notes (Signed)
Virtual Visit Consent   Jeffrey Chang, you are scheduled for a virtual visit with a [facility] provider today. Just as with appointments in the office, your consent must be obtained to participate. Your consent will be active for this visit and any virtual visit you may have with one of our providers in the next 365 days. If you have a MyChart account, a copy of this consent can be sent to you electronically.  As this is a virtual visit, video technology does not allow for your provider to perform a traditional examination. This may limit your provider's ability to fully assess your condition. If your provider identifies any concerns that need to be evaluated in person or the need to arrange testing (such as labs, EKG, etc.), we will make arrangements to do so. Although advances in technology are sophisticated, we cannot ensure that it will always work on either your end or our end. If the connection with a video visit is poor, the visit may have to be switched to a telephone visit. With either a video or telephone visit, we are not always able to ensure that we have a secure connection.  By engaging in this virtual visit, you consent to the provision of healthcare and authorize for your insurance to be billed (if applicable) for the services provided during this visit. Depending on your insurance coverage, you may receive a charge related to this service.  I need to obtain your verbal consent now. Are you willing to proceed with your visit today? Cinque Rial has provided verbal consent on 09/09/2022 for a virtual visit (video or telephone). Jeffrey Chang, [place]  Date: 09/09/2022 5:51 PM  Virtual Visit via Video Note   I, Jeffrey Chang, connected with  [name]  ([id number], July 22, 1995) on 09/09/22 at  6:00 PM EDT by a video-enabled telemedicine application and verified that I am speaking with the correct person using two identifiers.  Location: Patient: Virtual Visit Location  Patient: Home Provider: Virtual Visit Location Provider: Mobile   I discussed the limitations of evaluation and management by telemedicine and the availability of in person appointments. The patient expressed understanding and agreed to proceed.    History of Present Illness: Jeffrey Chang is a 27 y.o. who identifies as a male who was assigned male at birth, and is being seen today for bilateral eye redness with irritation and drainage. This morning with matting and blurring. Denies true fever. Was recently seen at [facility] for URI symptoms on 7/25 but these have resolved. Does not wear contact lenses.  HPI: HPI  Problems:  Patient Active Problem List   Diagnosis Date Noted   Cannabis abuse with psychotic disorder, with delusions (HCC) 07/07/2016    Allergies:  Allergies  Allergen Reactions   Latex    Medications:  Current Outpatient Medications:    trimethoprim-polymyxin b (POLYTRIM) ophthalmic solution, Apply 1-2 drops into affected eye QID x 5 days., Disp: 10 mL, Rfl: 0   methocarbamol (ROBAXIN) 500 MG tablet, Take 1 tablet (500 mg total) by mouth 2 (two) times daily., Disp: 20 tablet, Rfl: 0   predniSONE (DELTASONE) 20 MG tablet, Take 2 tablets (40 mg total) by mouth daily with breakfast., Disp: 10 tablet, Rfl: 0  Observations/Objective: Patient is well-developed, well-nourished in no acute distress.  Resting comfortably at home.  Head is normocephalic, atraumatic.  No labored breathing. Speech is clear and coherent with logical content.  Patient is alert and oriented at baseline.  Bilateral conjunctival injection noted without  active drainage. Pupils equal and round. EOMI without pain.   Assessment and Plan: 1. Acute bacterial conjunctivitis of both eyes - trimethoprim-polymyxin b (POLYTRIM) ophthalmic solution; Apply 1-2 drops into affected eye QID x 5 days.  Dispense: 10 mL; Refill: 0  Supportive measures and OTC medications reviewed. Polytrim per orders. In person follow-up  if not resolving or for any new/worsening symptoms despite treatment.   Follow Up Instructions: I discussed the assessment and treatment plan with the patient. The patient was provided an opportunity to ask questions and all were answered. The patient agreed with the plan and demonstrated an understanding of the instructions.  A copy of instructions were sent to the patient via MyChart unless otherwise noted below.   The patient was advised to call back or seek an in-person evaluation if the symptoms worsen or if the condition fails to improve as anticipated.  Time:  I spent 10 minutes with the patient via telehealth technology discussing the above problems/concerns.    Jeffrey Climes, PA-C

## 2022-09-11 ENCOUNTER — Telehealth: Payer: Self-pay | Admitting: Physician Assistant

## 2022-09-11 DIAGNOSIS — L0292 Furuncle, unspecified: Secondary | ICD-10-CM

## 2022-09-11 MED ORDER — CEPHALEXIN 500 MG PO CAPS: 500.0000 mg | ORAL_CAPSULE | Freq: Four times a day (QID) | ORAL | 0 refills | Status: DC

## 2022-09-11 NOTE — Progress Notes (Signed)
E Visit for Cellulitis  We are sorry that you are not feeling well. Here is how we plan to help!  Based on what you shared with me it looks like you have  a boil and are at increased risk for developing subsequent cellulitis.  Cellulitis looks like areas of skin redness, swelling, and warmth; it develops as a result of bacteria entering under the skin. Little red spots and/or bleeding can be seen in skin, and tiny surface sacs containing fluid can occur. Fever can be present. Cellulitis is almost always on one side of a body, and the lower limbs are the most common site of involvement.   Start a warm compress over the boil for 10 minutes, a few times per day. Try to avoid wearing clothing that will rub against the area.  I have prescribed:  Keflex 500mg  take one by mouth four times a day for 5 days  HOME CARE:  Take your medications as ordered and take all of them, even if the skin irritation appears to be healing.   GET HELP RIGHT AWAY IF:  Symptoms that don't begin to go away within 48 hours. Severe redness persists or worsens If the area turns color, spreads or swells. If it blisters and opens, develops yellow-brown crust or bleeds. You develop a fever or chills. If the pain increases or becomes unbearable.  Are unable to keep fluids and food down.  MAKE SURE YOU   Understand these instructions. Will watch your condition. Will get help right away if you are not doing well or get worse.  Thank you for choosing an e-visit.  Your e-visit answers were reviewed by a board certified advanced clinical practitioner to complete your personal care plan. Depending upon the condition, your plan could have included both over the counter or prescription medications.  Please review your pharmacy choice. Make sure the pharmacy is open so you can pick up prescription now. If there is a problem, you may contact your provider through [employer] and have the prescription routed to another  pharmacy.  Your safety is important to [place]. If you have drug allergies check your prescription carefully.   For the next 24 hours you can use MyChart to ask questions about today's visit, request a non-urgent call back, or ask for a work or school excuse. You will get an email in the next two days asking about your experience. I hope that your e-visit has been valuable and will speed your recovery.

## 2022-09-11 NOTE — Progress Notes (Signed)
I have spent 5 minutes in review of e-visit questionnaire, review and updating patient chart, medical decision making and response to patient.   William Cody Martin, PA-C    

## 2022-09-22 ENCOUNTER — Ambulatory Visit: Payer: Self-pay | Admitting: Surgery

## 2022-09-23 ENCOUNTER — Telehealth: Payer: Self-pay | Admitting: Nurse Practitioner

## 2022-09-23 DIAGNOSIS — J329 Chronic sinusitis, unspecified: Secondary | ICD-10-CM

## 2022-09-23 DIAGNOSIS — B9789 Other viral agents as the cause of diseases classified elsewhere: Secondary | ICD-10-CM

## 2022-09-23 MED ORDER — IPRATROPIUM BROMIDE 0.03 % NA SOLN: 2.0000 | Freq: Two times a day (BID) | NASAL | 12 refills | Status: DC

## 2022-09-23 NOTE — Progress Notes (Signed)
E-Visit for Sinus Problems  We are sorry that you are not feeling well.  Here is how we plan to help!  Based on what you have shared with me it looks like you have sinusitis.  Sinusitis is inflammation and infection in the sinus cavities of the head.  Based on your presentation I believe you most likely have Acute Viral Sinusitis.This is an infection most likely caused by a virus. There is not specific treatment for viral sinusitis other than to help you with the symptoms until the infection runs its course.  You may use an oral decongestant such as Mucinex D or if you have glaucoma or high blood pressure use plain Mucinex. Saline nasal spray help and can safely be used as often as needed for congestion, I have prescribed: Ipratropium Bromide nasal spray 0.03% 2 sprays in eah nostril 2-3 times a day  We do not recommend antibiotics prior to 7-10 days of symptoms Providers prescribe antibiotics to treat infections caused by bacteria. Antibiotics are very powerful in treating bacterial infections when they are used properly. To maintain their effectiveness, they should be used only when necessary. Overuse of antibiotics has resulted in the development of superbugs that are resistant to treatment!    After careful review of your answers, I would not recommend an antibiotic for your condition.  Antibiotics are not effective against viruses and therefore should not be used to treat them. Common examples of infections caused by viruses include colds and flu   Some authorities believe that zinc sprays or the use of Echinacea may shorten the course of your symptoms.  Sinus infections are not as easily transmitted as other respiratory infection, however we still recommend that you avoid close contact with loved ones, especially the very young and elderly.  Remember to wash your hands thoroughly throughout the day as this is the number one way to prevent the spread of infection!  Home Care: Only take  medications as instructed by your medical team. Do not take these medications with alcohol. A steam or ultrasonic humidifier can help congestion.  You can place a towel over your head and breathe in the steam from hot water coming from a faucet. Avoid close contacts especially the very young and the elderly. Cover your mouth when you cough or sneeze. Always remember to wash your hands.  Get Help Right Away If: You develop worsening fever or sinus pain. You develop a severe head ache or visual changes. Your symptoms persist after you have completed your treatment plan.  Make sure you Understand these instructions. Will watch your condition. Will get help right away if you are not doing well or get worse.   Thank you for choosing an e-visit.  Your e-visit answers were reviewed by a board certified advanced clinical practitioner to complete your personal care plan. Depending upon the condition, your plan could have included both over the counter or prescription medications.  Please review your pharmacy choice. Make sure the pharmacy is open so you can pick up prescription now. If there is a problem, you may contact your provider through [employer] and have the prescription routed to another pharmacy.  Your safety is important to [place]. If you have drug allergies check your prescription carefully.   For the next 24 hours you can use MyChart to ask questions about today's visit, request a non-urgent call back, or ask for a work or school excuse. You will get an email in the next two days asking about your  experience. I hope that your e-visit has been valuable and will speed your recovery.  Meds ordered this encounter  Medications   ipratropium (ATROVENT) 0.03 % nasal spray    Sig: Place 2 sprays into both nostrils every 12 (twelve) hours.    Dispense:  30 mL    Refill:  12    I spent approximately 5 minutes reviewing the patient's history, current symptoms and coordinating their care  today.

## 2022-09-25 ENCOUNTER — Telehealth: Payer: Self-pay | Admitting: Nurse Practitioner

## 2022-09-25 DIAGNOSIS — J0140 Acute pansinusitis, unspecified: Secondary | ICD-10-CM

## 2022-09-25 MED ORDER — AMOXICILLIN-POT CLAVULANATE 875-125 MG PO TABS
1.0000 | ORAL_TABLET | Freq: Two times a day (BID) | ORAL | 0 refills | Status: AC
Start: 2022-09-25 — End: 2022-10-02

## 2022-09-25 NOTE — Progress Notes (Signed)
Virtual Visit Consent   Jeffrey Chang, you are scheduled for a virtual visit with a [facility] provider today. Just as with appointments in the office, your consent must be obtained to participate. Your consent will be active for this visit and any virtual visit you may have with one of our providers in the next 365 days. If you have a MyChart account, a copy of this consent can be sent to you electronically.  As this is a virtual visit, video technology does not allow for your provider to perform a traditional examination. This may limit your provider's ability to fully assess your condition. If your provider identifies any concerns that need to be evaluated in person or the need to arrange testing (such as labs, EKG, etc.), we will make arrangements to do so. Although advances in technology are sophisticated, we cannot ensure that it will always work on either your end or our end. If the connection with a video visit is poor, the visit may have to be switched to a telephone visit. With either a video or telephone visit, we are not always able to ensure that we have a secure connection.  By engaging in this virtual visit, you consent to the provision of healthcare and authorize for your insurance to be billed (if applicable) for the services provided during this visit. Depending on your insurance coverage, you may receive a charge related to this service.  I need to obtain your verbal consent now. Are you willing to proceed with your visit today? Jeffrey Chang has provided verbal consent on 09/25/2022 for a virtual visit (video or telephone). Viviano Simas, FNP  Date: 09/25/2022 5:29 PM  Virtual Visit via Video Note   I, Viviano Simas, connected with  Jeffrey Chang  ([id number], August 24, 1995) on 09/25/22 at  5:30 PM EDT by a video-enabled telemedicine application and verified that I am speaking with the correct person using two identifiers.  Location: Patient: Virtual Visit Location Patient:  Home Provider: Virtual Visit Location Provider: Home Office   I discussed the limitations of evaluation and management by telemedicine and the availability of in person appointments. The patient expressed understanding and agreed to proceed.    History of Present Illness: Jeffrey Chang is a 27 y.o. who identifies as a male who was assigned male at birth, and is being seen today for ongoing sinus congestion - see prior visit   He had an Evisit a few days ago and was given a regimen that has not helped so far He believes now that it has been over 10 days of consistent symptoms   He has been using the nasal spray   He feels that he has a fever at the time of visit today   He was recently on Keflex for a skin infection that finished 09/21/22  Problems:  Patient Active Problem List   Diagnosis Date Noted   Cannabis abuse with psychotic disorder, with delusions (HCC) 07/07/2016    Allergies:  Allergies  Allergen Reactions   Latex    Medications:  Current Outpatient Medications:    cephALEXin (KEFLEX) 500 MG capsule, Take 1 capsule (500 mg total) by mouth 4 (four) times daily., Disp: 20 capsule, Rfl: 0   ipratropium (ATROVENT) 0.03 % nasal spray, Place 2 sprays into both nostrils every 12 (twelve) hours., Disp: 30 mL, Rfl: 12   methocarbamol (ROBAXIN) 500 MG tablet, Take 1 tablet (500 mg total) by mouth 2 (two) times daily., Disp: 20 tablet, Rfl: 0   predniSONE (DELTASONE)  20 MG tablet, Take 2 tablets (40 mg total) by mouth daily with breakfast., Disp: 10 tablet, Rfl: 0   trimethoprim-polymyxin b (POLYTRIM) ophthalmic solution, Apply 1-2 drops into affected eye QID x 5 days., Disp: 10 mL, Rfl: 0  Observations/Objective: Patient is well-developed, well-nourished in no acute distress.  Resting comfortably  at home.  Head is normocephalic, atraumatic.  No labored breathing.  Speech is clear and coherent with logical content.  Patient is alert and oriented at baseline.    Assessment  and Plan:  1. Acute non-recurrent pansinusitis  - amoxicillin-clavulanate (AUGMENTIN) 875-125 MG tablet; Take 1 tablet by mouth 2 (two) times daily for 7 days. Take with food  Dispense: 14 tablet; Refill: 0    Advised probiotic due to back to back antibiotic use  Discussed risks of repeated antibiotic use with patient   Continue nasal spray for added support   Follow Up Instructions: I discussed the assessment and treatment plan with the patient. The patient was provided an opportunity to ask questions and all were answered. The patient agreed with the plan and demonstrated an understanding of the instructions.  A copy of instructions were sent to the patient via MyChart unless otherwise noted below.    The patient was advised to call back or seek an in-person evaluation if the symptoms worsen or if the condition fails to improve as anticipated.  Time:  I spent 11 minutes with the patient via telehealth technology discussing the above problems/concerns.    Viviano Simas, FNP

## 2022-09-26 ENCOUNTER — Encounter (HOSPITAL_COMMUNITY): Payer: Self-pay

## 2022-09-30 ENCOUNTER — Telehealth: Payer: Self-pay | Admitting: Emergency Medicine

## 2022-09-30 DIAGNOSIS — B379 Candidiasis, unspecified: Secondary | ICD-10-CM

## 2022-09-30 DIAGNOSIS — T3695XA Adverse effect of unspecified systemic antibiotic, initial encounter: Secondary | ICD-10-CM

## 2022-09-30 MED ORDER — FLUCONAZOLE 150 MG PO TABS: 150.0000 mg | ORAL_TABLET | Freq: Once | ORAL | 0 refills | Status: AC

## 2022-09-30 NOTE — Progress Notes (Signed)
E-Visit for [employer]  We are sorry that you are not feeling well. Here is how we plan to help!  Based on what you shared with me it looks like you have tinea cruris, or "Jock Itch".  The symptoms of Jock Itch include red, peeling, itchy rash that affects the groin (crease where the leg meets the trunk).  This fungal infection can be spread through shared towels, clothing, bedding, or hard surfaces (particularly in moist areas) such as shower stalls, locker room floors, or pool area that has the fungus present. If you have a fungal infection on one part of your body, you can also spread it to other parts. For instance, men with a fungal infection on their feet sometimes spread it to their groin.   Prescription medications are only indicated for an extensive rash or if over the counter treatments have failed.  I am prescribing: Fluconazole 150mg   HOME CARE:  Keep affected area clean, dry, and cool. Wash with soap and shampoo after sports or exercise and dry yourself well after bathing or swimming Wear cotton underwear and change them if they become damp or sweaty. Avoid using swimming pools, public showers, or baths.  GET HELP RIGHT AWAY IF:  Symptoms that don't away after treatment. Severe itching that persists. If your rash spreads or swells. If your rash begins to have drainage or smell. You develop a fever.  MAKE SURE YOU   Understand these instructions. Will watch your condition. Will get help right away if you are not doing well or get worse.  Thank you for choosing an e-visit.  Your e-visit answers were reviewed by a board certified advanced clinical practitioner to complete your personal care plan. Depending upon the condition, your plan could have included both over the counter or prescription medications.  Please review your pharmacy choice. Make sure the pharmacy is open so you can pick up prescription now. If there is a problem, you may contact your provider through  [employer] and have the prescription routed to another pharmacy.  Your safety is important to [place]. If you have drug allergies check your prescription carefully.   For the next 24 hours you can use MyChart to ask questions about today's visit, request a non-urgent call back, or ask for a work or school excuse. You will get an email in the next two days asking about your experience. I hope that your e-visit has been valuable and will speed your recovery.   References or for more information:  [email or web address]&source=search_result&selectedTitle=3~52&usage_type=default&display_rank=3  I have spent 5 minutes in review of e-visit questionnaire, review and updating patient chart, medical decision making and response to patient.   Rica Mast, PhD, FNP-BC

## 2022-10-16 ENCOUNTER — Other Ambulatory Visit: Payer: Self-pay | Admitting: Surgery

## 2022-10-16 DIAGNOSIS — R1031 Right lower quadrant pain: Secondary | ICD-10-CM

## 2022-10-16 DIAGNOSIS — R1030 Lower abdominal pain, unspecified: Secondary | ICD-10-CM

## 2022-10-16 DIAGNOSIS — N5082 Scrotal pain: Secondary | ICD-10-CM

## 2022-10-21 ENCOUNTER — Ambulatory Visit
Admission: RE | Admit: 2022-10-21 | Discharge: 2022-10-21 | Disposition: A | Discharge status: Home / Self Care | Payer: Self-pay | Source: Ambulatory Visit | Attending: Surgery | Admitting: Surgery

## 2022-10-21 DIAGNOSIS — N5082 Scrotal pain: Secondary | ICD-10-CM

## 2022-10-21 DIAGNOSIS — R1031 Right lower quadrant pain: Secondary | ICD-10-CM

## 2022-10-21 DIAGNOSIS — R1030 Lower abdominal pain, unspecified: Secondary | ICD-10-CM

## 2022-10-22 ENCOUNTER — Encounter: Payer: Self-pay | Admitting: Physician Assistant

## 2022-10-22 ENCOUNTER — Telehealth: Payer: 59 | Admitting: Physician Assistant

## 2022-10-22 DIAGNOSIS — T7840XA Allergy, unspecified, initial encounter: Secondary | ICD-10-CM

## 2022-10-22 MED ORDER — HYDROXYZINE PAMOATE 25 MG PO CAPS: 25.0000 mg | ORAL_CAPSULE | Freq: Three times a day (TID) | ORAL | 0 refills | Status: DC | PRN

## 2022-10-22 MED ORDER — HYDROXYZINE HCL 10 MG PO TABS: 10.0000 mg | ORAL_TABLET | Freq: Three times a day (TID) | ORAL | 0 refills | Status: DC | PRN

## 2022-10-22 NOTE — Patient Instructions (Addendum)
[name], thank you for joining [name], PA-C for today's virtual visit.  While this provider is not your primary care provider (PCP), if your PCP is located in our provider database this encounter information will be shared with them immediately following your visit.   A [facility] MyChart account gives you access to today's visit and all your visits, tests, and labs performed at [facility] " click here if you don't have a [facility] MyChart account or go to [email or web address]  Consent: (Patient) Jeffrey Chang provided verbal consent for this virtual visit at the beginning of the encounter.  Current Medications:  Current Outpatient Medications:    hydrOXYzine (ATARAX) 10 MG tablet, Take 1 tablet (10 mg total) by mouth 3 (three) times daily as needed., Disp: 30 tablet, Rfl: 0   ipratropium (ATROVENT) 0.03 % nasal spray, Place 2 sprays into both nostrils every 12 (twelve) hours., Disp: 30 mL, Rfl: 12   methocarbamol (ROBAXIN) 500 MG tablet, Take 1 tablet (500 mg total) by mouth 2 (two) times daily., Disp: 20 tablet, Rfl: 0   Medications ordered in this encounter:  Meds ordered this encounter  Medications   DISCONTD: hydrOXYzine (VISTARIL) 25 MG capsule    Sig: Take 1 capsule (25 mg total) by mouth every 8 (eight) hours as needed.    Dispense:  30 capsule    Refill:  0    Order Specific Question:   Supervising Provider    Answer:   Merrilee Jansky [[id number]]   hydrOXYzine (ATARAX) 10 MG tablet    Sig: Take 1 tablet (10 mg total) by mouth 3 (three) times daily as needed.    Dispense:  30 tablet    Refill:  0    Discontinue RX 25 mg dose.    Order Specific Question:   Supervising Provider    Answer:   Merrilee Jansky [[id number]]     *If you need refills on other medications prior to your next appointment, please contact your pharmacy*  Follow-Up: Call back or seek an in-person evaluation if the symptoms worsen or if the condition fails to  improve as anticipated.  [facility] [phone]  Other Instructions Allergies, Adult An allergy is when your body reacts to something that bothers it (allergen). Allergies often affect the nose, eyes, skin, and stomach. They cannot spread from person to person. Allergies can start at any age. Sometimes, they go away as you get older. What are the causes? Outdoor things, like pollen, car fumes, and mold. Indoor things, like dust, smoke, mold, and pets. Foods. Medicines. Things that bother your skin. These include perfumes and bug bites. What increases the risk? Having family members with allergies or asthma. What are the signs or symptoms? Allergies may cause: A runny nose, stuffy nose, or sneezing. An itchy mouth, ears, or throat. A feeling of mucus dripping down the back of your throat. A sore throat. Eyes that itch or are red, watery, or puffy. A skin rash or red, swollen areas of skin (hives). Stomach cramps or bloating. If you have a very bad allergic reaction (anaphylactic reaction) to food, medicine, or bug bites, you may also have: A red face. Coughing. Swollen lips, tongue, or mouth. A tight or swollen throat. Chest pain. Your chest may feel tight. Your heart may beat too fast. Trouble breathing. Pain in your belly (abdomen). You may vomit or have watery poop (diarrhea). Fainting. Or you may feel dizzy. If you have a very bad reaction  to an allergy, you should get help right away. How is this treated?     You may need to use: Cold, wet cloths. These can help with itching and swelling. Eye drops, nose sprays, or skin creams. A saline solution to wash out your nose. Saline solutions are made of salt and water. A humidifier. Medicines. You may need to change the foods you eat. You may be given allergy shots, or a small bit of the allergen may be put under your tongue. These treatments can help your body get used to the allergen. If you have a very  bad allergic reaction, you may need to use an auto-injector pen. An auto-injector pen is a device filled with medicine. It gives you an emergency shot of epinephrine. Your doctor will teach you how to use it. Follow these instructions at home: Medicines  Take or apply over-the-counter and prescription medicines only as told by your doctor. Keep an auto-injector pen with you all the time if you might have a very bad allergic reaction. Eating and drinking Follow instructions from your doctor about what you may eat and drink. Drink enough fluid to keep your pee (urine) pale yellow. General instructions If you have ever had a bad reaction, wear a medical alert bracelet or necklace. Stay away from the things you are allergic to. Keep all follow-up visits. Your doctor will check on how you are doing and talk about treatment options with you. Contact a doctor if: You do not get better with treatment. Get help right away if: You have a very bad allergic reaction. You use your auto-injector pen. You must go to the hospital even if the medicine seems to be working. These symptoms may be an emergency. Use the auto-injector pen right away. Then call 911. Do not wait to see if the symptoms will go away. Do not drive yourself to the hospital. This information is not intended to replace advice given to you by your health care provider. Make sure you discuss any questions you have with your health care provider. Document Revised: 10/09/2021 Document Reviewed: 10/09/2021 Elsevier Patient Education  2024 Elsevier Inc.   If you have been instructed to have an in-person evaluation today at a local Urgent Care facility, please use the link below. It will take you to a list of all of our available [facility], including address, phone number and hours of operation. Please do not delay care.  [facility]  If you or a family member do not have a primary care provider, use the link  below to schedule a visit and establish care. When you choose a [facility] primary care physician or advanced practice provider, you gain a long-term partner in health. Find a Primary Care Provider  Learn more about [facility]'s in-office and virtual care options:  - Get Care Now

## 2022-10-22 NOTE — Progress Notes (Signed)
Virtual Visit Consent   Jeffrey Chang, you are scheduled for a virtual visit with a  provider today. Just as with appointments in the office, your consent must be obtained to participate. Your consent will be active for this visit and any virtual visit you may have with one of our providers in the next 365 days. If you have a MyChart account, a copy of this consent can be sent to you electronically.  As this is a virtual visit, video technology does not allow for your provider to perform a traditional examination. This may limit your provider's ability to fully assess your condition. If your provider identifies any concerns that need to be evaluated in person or the need to arrange testing (such as labs, EKG, etc.), we will make arrangements to do so. Although advances in technology are sophisticated, we cannot ensure that it will always work on either your end or our end. If the connection with a video visit is poor, the visit may have to be switched to a telephone visit. With either a video or telephone visit, we are not always able to ensure that we have a secure connection.  By engaging in this virtual visit, you consent to the provision of healthcare and authorize for your insurance to be billed (if applicable) for the services provided during this visit. Depending on your insurance coverage, you may receive a charge related to this service.  I need to obtain your verbal consent now. Are you willing to proceed with your visit today? Jeffrey Chang has provided verbal consent on 10/22/2022 for a virtual visit (video or telephone). [name], [place]  Date: 10/22/2022 7:20 PM  Virtual Visit via Video Note   I, [name], connected with  Jeffrey Chang  ([id number], 10-19-1995) on 10/22/22 at  7:00 PM EDT by a video-enabled telemedicine application and verified that I am speaking with the correct person using two identifiers.  Location: Patient: Virtual Visit Location  Patient: Home Provider: Virtual Visit Location Provider: Home Office   I discussed the limitations of evaluation and management by telemedicine and the availability of in person appointments. The patient expressed understanding and agreed to proceed.    History of Present Illness: Jeffrey Chang is a 27 y.o. who identifies as a male who was assigned male at birth, and is being seen today for allergic reaction occurring 2 days ago after being exposed to latex gloves..  Noted initial itching, facial swelling and tongue itching for which he took OTC Benadryl with relief. Notes symptoms mostly resolved but still with some residual facial itching. Denies rash.    HPI: HPI  Problems:  Patient Active Problem List   Diagnosis Date Noted   Cannabis abuse with psychotic disorder, with delusions (HCC) 07/07/2016    Allergies:  Allergies  Allergen Reactions   Latex    Medications:  Current Outpatient Medications:    hydrOXYzine (ATARAX) 10 MG tablet, Take 1 tablet (10 mg total) by mouth 3 (three) times daily as needed., Disp: 30 tablet, Rfl: 0   ipratropium (ATROVENT) 0.03 % nasal spray, Place 2 sprays into both nostrils every 12 (twelve) hours., Disp: 30 mL, Rfl: 12   methocarbamol (ROBAXIN) 500 MG tablet, Take 1 tablet (500 mg total) by mouth 2 (two) times daily., Disp: 20 tablet, Rfl: 0  Observations/Objective: Patient is well-developed, well-nourished in no acute distress.  Resting comfortably  at home.  Head is normocephalic, atraumatic.  No labored breathing.  Speech is clear and coherent with  logical content.  Patient is alert and oriented at baseline.  No visible swelling of face, lips, periorbital regions. No rash noted.  Assessment and Plan: 1. Allergic reaction, initial encounter - hydrOXYzine (ATARAX) 10 MG tablet; Take 1 tablet (10 mg total) by mouth 3 (three) times daily as needed.  Dispense: 30 tablet; Refill: 0  To latex. Prior history. Symptoms mostly resolved. Still with  some residual itching. Will Rx hydroxyzine. Needs in person follow-up for any non resolving symptoms after the next couple of days. PCP resources given.  Follow Up Instructions: I discussed the assessment and treatment plan with the patient. The patient was provided an opportunity to ask questions and all were answered. The patient agreed with the plan and demonstrated an understanding of the instructions.  A copy of instructions were sent to the patient via MyChart unless otherwise noted below.   The patient was advised to call back or seek an in-person evaluation if the symptoms worsen or if the condition fails to improve as anticipated.  Time:  I spent 10 minutes with the patient via telehealth technology discussing the above problems/concerns.    [name], PA-C

## 2022-10-23 ENCOUNTER — Other Ambulatory Visit: Payer: Self-pay | Admitting: Surgery

## 2022-10-23 DIAGNOSIS — R1030 Lower abdominal pain, unspecified: Secondary | ICD-10-CM

## 2022-10-25 ENCOUNTER — Telehealth: Payer: Managed Care, Other (non HMO) | Admitting: Nurse Practitioner

## 2022-10-25 DIAGNOSIS — K649 Unspecified hemorrhoids: Secondary | ICD-10-CM | POA: Diagnosis not present

## 2022-10-26 MED ORDER — HYDROCORTISONE ACETATE 25 MG RE SUPP: 25.0000 mg | Freq: Two times a day (BID) | RECTAL | 0 refills | Status: DC

## 2022-10-26 NOTE — Progress Notes (Signed)
I have spent 5 minutes in review of e-visit questionnaire, review and updating patient chart, medical decision making and response to patient.  ° °Jerrell Mangel W Secilia Apps, NP ° °  °

## 2022-10-26 NOTE — Progress Notes (Signed)

## 2022-10-29 ENCOUNTER — Ambulatory Visit
Admission: RE | Admit: 2022-10-29 | Discharge: 2022-10-29 | Disposition: A | Discharge status: Home / Self Care | Payer: 59 | Source: Ambulatory Visit | Attending: Surgery | Admitting: Surgery

## 2022-10-29 DIAGNOSIS — R1030 Lower abdominal pain, unspecified: Secondary | ICD-10-CM

## 2022-10-29 MED ORDER — IOPAMIDOL (ISOVUE-300) INJECTION 61%
100.0000 mL | Freq: Once | INTRAVENOUS | Status: AC | PRN
  Administered 2022-10-29: 100 mL via INTRAVENOUS

## 2022-11-10 ENCOUNTER — Telehealth: Payer: Managed Care, Other (non HMO)

## 2022-11-10 DIAGNOSIS — B353 Tinea pedis: Secondary | ICD-10-CM | POA: Diagnosis not present

## 2022-11-11 MED ORDER — FLUCONAZOLE 150 MG PO TABS: 150.0000 mg | ORAL_TABLET | ORAL | 0 refills | Status: AC

## 2022-11-11 NOTE — Progress Notes (Signed)
I have spent 5 minutes in review of e-visit questionnaire, review and updating patient chart, medical decision making and response to patient.   Mia Milan Cody Jacklynn Dehaas, PA-C    

## 2022-11-11 NOTE — Progress Notes (Signed)
E-Visit for Athlete's Foot  We are sorry that you are not feeling well. Here is how we plan to help!  Based on what you shared with me it looks like you have tinea pedis, or "Athlete's Foot".  This type of rash can spread through shared towels, clothing, bedding, etc., as well as hard surfaces (particularly in moist areas) such as shower stalls, locker room floors, pool areas, etc. The symptoms of Athlete's Foot include red, swollen, peeling, itchy skin between the toes (especially between the pinky toe and the one next to it). The sole and heel of the foot may also be affected. In severe cases, the skin on the feet can blister.  Athlete's foot can usually be treated with over-the-counter topical antifungal products; but sometimes with chronic or extensive tinea pedis, prescription oral medications are needed.   Prescription medications are only indicated for an extensive rash or if over the counter treatments have failed.  I am prescribing:Fluconazole 150 mg once weekly for two to four weeks  HOME CARE:  Keep feet clean, dry, and cool. Avoid using swimming pools, public showers, or foot baths. Wear sandals when possible or air shoes out by alternating them every 2-3 days. Avoid wearing closed shoes and wearing socks made from fabric that doesn't dry easily (for example, nylon). Treat the infection with recommended medication  GET HELP RIGHT AWAY IF:  Symptoms that don't go away after treatment. Severe itching that persists. If your rash spreads or swells. If your rash begins to have drainage or smell. You develop a fever.  MAKE SURE YOU   Understand these instructions. Will watch your condition. Will get help right away if you are not doing well or get worse.   Thank you for choosing an e-visit.  Your e-visit answers were reviewed by a board certified advanced clinical practitioner to complete your personal care plan. Depending upon the condition, your plan could have  included both over the counter or prescription medications.  Please review your pharmacy choice. Make sure the pharmacy is open so you can pick up prescription now. If there is a problem, you may contact your provider through [employer] and have the prescription routed to another pharmacy.  Your safety is important to [place]. If you have drug allergies check your prescription carefully.   For the next 24 hours you can use MyChart to ask questions about today's visit, request a non-urgent call back, or ask for a work or school excuse.  You will get an email in the next two days asking about your experience. I hope that your e-visit has been valuable and will speed your recovery  References or for more information:  FatMenus.com.au?search=[email or web address]%[email or web address]&source=search_result&selectedTitle=1~104&usage_type=default&display_rank=1  [email or web address]

## 2022-11-17 ENCOUNTER — Telehealth: Payer: Managed Care, Other (non HMO) | Admitting: Physician Assistant

## 2022-11-17 DIAGNOSIS — H6993 Unspecified Eustachian tube disorder, bilateral: Secondary | ICD-10-CM

## 2022-11-17 MED ORDER — FLUTICASONE PROPIONATE 50 MCG/ACT NA SUSP: 2.0000 | Freq: Every day | NASAL | 0 refills | Status: DC

## 2022-11-17 MED ORDER — NEOMYCIN-POLYMYXIN-HC 3.5-10000-1 OT SUSP
3.0000 [drp] | Freq: Four times a day (QID) | OTIC | 0 refills | Status: DC
Start: 2022-11-17 — End: 2022-11-30

## 2022-11-17 NOTE — Progress Notes (Signed)
E-Visit for Ear Pain - Eustachian Tube Dysfunction   We are sorry that you are not feeling well. Here is how we plan to help!  Based on what you have shared with me it looks like you have Eustachian Tube Dysfunction.  Eustachian Tube Dysfunction is a condition where the tubes that connect your middle ears to your upper throat become blocked. This can lead to discomfort, hearing difficulties and a feeling of fullness in your ear. Eustachian tube dysfunction usually resolves itself in a few days. The usual symptoms include: Hearing problems Tinnitus, or ringing in your ears Clicking or popping sounds A feeling of fullness in your ears Pain that mimics an ear infection Dizziness, vertigo or balance problems A "tickling" sensation in your ears  ?Eustachian tube dysfunction symptoms may get worse in higher altitudes. This is called barotrauma, and it can happen while scuba diving, flying in an airplane or driving in the mountains.   What causes eustachian tube dysfunction? Allergies and infections (like the common cold and the flu) are the most common causes of eustachian tube dysfunction. These conditions can cause inflammation and mucus buildup, leading to blockage. GERD, or chronic acid reflux, can also cause ETD. This is because stomach acid can back up into your throat and result in inflammation. As mentioned above, altitude changes can also cause ETD.   What are some common eustachian tube dysfunction treatments? In most cases, treatment isn't necessary because ETD often resolves on its own. However, you might need treatment if your symptoms linger for more than two weeks.    Eustachian tube dysfunction treatment depends on the cause and the severity of your condition. Treatments may include home remedies, medications or, in severe cases, surgery.     HOME CARE: Sometimes simple home remedies can help with mild cases of eustachian tube dysfunction. To try and clear the blockage, you  can: Chew gum. Yawn. Swallow. Try the Valsalva maneuver (breathing out forcefully while closing your mouth and pinching your nostrils). Use a saline spray to clear out nasal passages.  MEDICATIONS: Over-the-counter medications can help if allergies are causing eustachian tube dysfunction. Try antihistamines (like cetirizine or diphenhydramine) to ease your symptoms. If you have discomfort, pain relievers -- such as acetaminophen or ibuprofen -- can help.  Sometimes intranasal glucocorticosteroids (like Flonase or Nasacort) help.  I have prescribed Fluticasone 50 mcg/spray 2 sprays in each nostril daily for 10-14 days, and Cortisporin ear drops Place 3 drops in each ear every 6 hours while awake for 7 days.    GET HELP RIGHT AWAY IF: Fever is over 102.2 degrees. You develop progressive ear pain or hearing loss. Ear symptoms persist longer than 3 days after treatment.  MAKE SURE YOU: Understand these instructions. Will watch your condition. Will get help right away if you are not doing well or get worse.  Thank you for choosing an e-visit.  Your e-visit answers were reviewed by a board certified advanced clinical practitioner to complete your personal care plan. Depending upon the condition, your plan could have included both over the counter or prescription medications.  Please review your pharmacy choice. Make sure the pharmacy is open so you can pick up the prescription now. If there is a problem, you may contact your provider through [employer] and have the prescription routed to another pharmacy.  Your safety is important to [place]. If you have drug allergies check your prescription carefully.   For the next 24 hours you can use MyChart to ask questions  about today's visit, request a non-urgent call back, or ask for a work or school excuse. You will get an email with a survey after your eVisit asking about your experience. We would appreciate your feedback. I hope that your  e-visit has been valuable and will aid in your recovery.     I have spent 5 minutes in review of e-visit questionnaire, review and updating patient chart, medical decision making and response to patient.   Margaretann Loveless, PA-C

## 2022-11-20 ENCOUNTER — Telehealth: Payer: Managed Care, Other (non HMO) | Admitting: Family Medicine

## 2022-11-20 DIAGNOSIS — M79675 Pain in left toe(s): Secondary | ICD-10-CM

## 2022-11-20 NOTE — Progress Notes (Signed)
[place]  Reports a ingrown toenail and feels bad. Would like it possibly looked at and removed it needed.Advised to go to UC to be seen for in person assessment and treatment as needed.  Patient acknowledged agreement and understanding of the plan.

## 2022-11-30 ENCOUNTER — Telehealth: Payer: Managed Care, Other (non HMO) | Admitting: Physician Assistant

## 2022-11-30 DIAGNOSIS — H1030 Unspecified acute conjunctivitis, unspecified eye: Secondary | ICD-10-CM | POA: Diagnosis not present

## 2022-11-30 DIAGNOSIS — H00019 Hordeolum externum unspecified eye, unspecified eyelid: Secondary | ICD-10-CM | POA: Diagnosis not present

## 2022-11-30 MED ORDER — NEOMYCIN-POLYMYXIN-HC 3.5-10000-1 OP SUSP: 3.0000 [drp] | Freq: Four times a day (QID) | OPHTHALMIC | 0 refills | Status: DC

## 2022-11-30 NOTE — Progress Notes (Signed)
  E-Visit for Stye   We are sorry that you are not feeling well. Here is how we plan to help!  Based on what you have shared with me it looks like you have a stye.  A stye is an inflammation of the eyelid.  It is often a red, painful lump near the edge of the eyelid that may look like a boil or a pimple.  A stye develops when an infection occurs at the base of an eyelash.   We have made appropriate suggestions for you based upon your presentation: Your symptoms may indicate an infection of the sclera.  The use of anti-inflammatory and antibiotic eye drops for a week will help resolve this condition.  I have sent in neomycin-polymyxin HC opthalmic suspension, two to three drops in the affected eye every 4 hours.  If your symptoms do not improve over the next two to three days you should be seen in your doctor's office.  HOME CARE:  Wash your hands often! Let the stye open on its own. Don't squeeze or open it. Don't rub your eyes. This can irritate your eyes and let in bacteria.  If you need to touch your eyes, wash your hands first. Don't wear eye makeup or contact lenses until the area has healed.  GET HELP RIGHT AWAY IF:  Your symptoms do not improve. You develop blurred or loss of vision. Your symptoms worsen (increased discharge, pain or redness).   Thank you for choosing an e-visit.  Your e-visit answers were reviewed by a board certified advanced clinical practitioner to complete your personal care plan. Depending upon the condition, your plan could have included both over the counter or prescription medications.  Please review your pharmacy choice. Make sure the pharmacy is open so you can pick up prescription now. If there is a problem, you may contact your provider through [employer] and have the prescription routed to another pharmacy.  Your safety is important to [place]. If you have drug allergies check your prescription carefully.   For the next 24 hours you can use  MyChart to ask questions about today's visit, request a non-urgent call back, or ask for a work or school excuse. You will get an email in the next two days asking about your experience. I hope that your e-visit has been valuable and will speed your recovery.   I have spent 5 minutes in review of e-visit questionnaire, review and updating patient chart, medical decision making and response to patient.   Tylene Fantasia Ward, PA-C

## 2022-12-06 ENCOUNTER — Telehealth: Payer: Managed Care, Other (non HMO) | Admitting: Family Medicine

## 2022-12-06 DIAGNOSIS — L729 Follicular cyst of the skin and subcutaneous tissue, unspecified: Secondary | ICD-10-CM | POA: Diagnosis not present

## 2022-12-06 NOTE — Progress Notes (Signed)
Per the questionnaire, the patient is not complaining of a rash but a cyst in the buttocks.  I will direct the patient to be seen in person as this cannot be taken care of via an E-visit. Will also provide list of urgent cares and emergency room hospitals near his area.   I have spent 5 minutes in review of e-visit questionnaire, review and updating patient chart, medical decision making and response to patient.   Reed Pandy, PA-C

## 2022-12-09 ENCOUNTER — Telehealth: Payer: Managed Care, Other (non HMO) | Admitting: Physician Assistant

## 2022-12-09 DIAGNOSIS — L0501 Pilonidal cyst with abscess: Secondary | ICD-10-CM

## 2022-12-09 MED ORDER — DOXYCYCLINE HYCLATE 100 MG PO TABS
100.0000 mg | ORAL_TABLET | Freq: Two times a day (BID) | ORAL | 0 refills | Status: DC
Start: 2022-12-09 — End: 2022-12-14

## 2022-12-09 NOTE — Progress Notes (Signed)
Virtual Visit Consent   [name], you are scheduled for a virtual visit with a [facility] provider today. Just as with appointments in the office, your consent must be obtained to participate. Your consent will be active for this visit and any virtual visit you may have with one of our providers in the next 365 days. If you have a MyChart account, a copy of this consent can be sent to you electronically.  As this is a virtual visit, video technology does not allow for your provider to perform a traditional examination. This may limit your provider's ability to fully assess your condition. If your provider identifies any concerns that need to be evaluated in person or the need to arrange testing (such as labs, EKG, etc.), we will make arrangements to do so. Although advances in technology are sophisticated, we cannot ensure that it will always work on either your end or our end. If the connection with a video visit is poor, the visit may have to be switched to a telephone visit. With either a video or telephone visit, we are not always able to ensure that we have a secure connection.  By engaging in this virtual visit, you consent to the provision of healthcare and authorize for your insurance to be billed (if applicable) for the services provided during this visit. Depending on your insurance coverage, you may receive a charge related to this service.  I need to obtain your verbal consent now. Are you willing to proceed with your visit today? Jeffrey Chang has provided verbal consent on 12/09/2022 for a virtual visit (video or telephone). [name], [place]  Date: 12/09/2022 9:58 AM  Virtual Visit via Video Note   I, [name], connected with  Jeffrey Chang  ([id number], Jun 21, 1995) on 12/09/22 at  9:45 AM EDT by a video-enabled telemedicine application and verified that I am speaking with the correct person using two identifiers.  Location: Patient: Virtual Visit  Location Patient: Home Provider: Virtual Visit Location Provider: Home Office   I discussed the limitations of evaluation and management by telemedicine and the availability of in person appointments. The patient expressed understanding and agreed to proceed.    History of Present Illness: Jeffrey Chang is a 27 y.o. who identifies as a male who was assigned male at birth, and is being seen today for boil at the top of his buttock first noted 2 weeks ago but at that time was mild and not really bothersome. Has been worsening over the past few days with substantial tenderness. Has an EV the other day -- recommended face-to-face evaluation. Had similar issue near this area in the past. Denies fever at present. Feels under the weather.  No noted drainage.  OTC -- Ibuprofen last night.   HPI: HPI  Problems:  Patient Active Problem List   Diagnosis Date Noted   Cannabis abuse with psychotic disorder, with delusions (HCC) 07/07/2016    Allergies:  Allergies  Allergen Reactions   Latex    Medications:  Current Outpatient Medications:    doxycycline (VIBRA-TABS) 100 MG tablet, Take 1 tablet (100 mg total) by mouth 2 (two) times daily., Disp: 14 tablet, Rfl: 0   fluticasone (FLONASE) 50 MCG/ACT nasal spray, Place 2 sprays into both nostrils daily., Disp: 16 g, Rfl: 0   hydrocortisone (ANUSOL-HC) 25 MG suppository, Place 1 suppository (25 mg total) rectally 2 (two) times daily., Disp: 12 suppository, Rfl: 0   hydrOXYzine (ATARAX) 10 MG tablet, Take 1 tablet (10  mg total) by mouth 3 (three) times daily as needed., Disp: 30 tablet, Rfl: 0   ipratropium (ATROVENT) 0.03 % nasal spray, Place 2 sprays into both nostrils every 12 (twelve) hours., Disp: 30 mL, Rfl: 12   methocarbamol (ROBAXIN) 500 MG tablet, Take 1 tablet (500 mg total) by mouth 2 (two) times daily., Disp: 20 tablet, Rfl: 0   neomycin-polymyxin-hydrocortisone (CORTISPORIN) 3.5-10000-1 ophthalmic suspension, Place 3 drops into the right  eye 4 (four) times daily., Disp: 7.5 mL, Rfl: 0  Observations/Objective: Patient is well-developed, well-nourished in no acute distress.  Resting comfortably at home.  Head is normocephalic, atraumatic.  No labored breathing. Speech is clear and coherent with logical content.  Patient is alert and oriented at baseline.   Assessment and Plan: 1. Pilonidal cyst with abscess - doxycycline (VIBRA-TABS) 100 MG tablet; Take 1 tablet (100 mg total) by mouth 2 (two) times daily.  Dispense: 14 tablet; Refill: 0  Supportive measures and OTC medications reviewed. Doxycycline per orders. Strict in-person follow-up precautions reviewed.   Follow Up Instructions: I discussed the assessment and treatment plan with the patient. The patient was provided an opportunity to ask questions and all were answered. The patient agreed with the plan and demonstrated an understanding of the instructions.  A copy of instructions were sent to the patient via MyChart unless otherwise noted below.    The patient was advised to call back or seek an in-person evaluation if the symptoms worsen or if the condition fails to improve as anticipated.    [name], PA-C

## 2022-12-09 NOTE — Patient Instructions (Signed)
[name], thank you for joining [name], PA-C for today's virtual visit.  While this provider is not your primary care provider (PCP), if your PCP is located in our provider database this encounter information will be shared with them immediately following your visit.   A [facility] MyChart account gives you access to today's visit and all your visits, tests, and labs performed at [facility] " click here if you don't have a [facility] MyChart account or go to [email or web address]  Consent: (Patient) Jeffrey Chang provided verbal consent for this virtual visit at the beginning of the encounter.  Current Medications:  Current Outpatient Medications:    doxycycline (VIBRA-TABS) 100 MG tablet, Take 1 tablet (100 mg total) by mouth 2 (two) times daily., Disp: 14 tablet, Rfl: 0   fluticasone (FLONASE) 50 MCG/ACT nasal spray, Place 2 sprays into both nostrils daily., Disp: 16 g, Rfl: 0   hydrocortisone (ANUSOL-HC) 25 MG suppository, Place 1 suppository (25 mg total) rectally 2 (two) times daily., Disp: 12 suppository, Rfl: 0   hydrOXYzine (ATARAX) 10 MG tablet, Take 1 tablet (10 mg total) by mouth 3 (three) times daily as needed., Disp: 30 tablet, Rfl: 0   ipratropium (ATROVENT) 0.03 % nasal spray, Place 2 sprays into both nostrils every 12 (twelve) hours., Disp: 30 mL, Rfl: 12   methocarbamol (ROBAXIN) 500 MG tablet, Take 1 tablet (500 mg total) by mouth 2 (two) times daily., Disp: 20 tablet, Rfl: 0   neomycin-polymyxin-hydrocortisone (CORTISPORIN) 3.5-10000-1 ophthalmic suspension, Place 3 drops into the right eye 4 (four) times daily., Disp: 7.5 mL, Rfl: 0   Medications ordered in this encounter:  Meds ordered this encounter  Medications   doxycycline (VIBRA-TABS) 100 MG tablet    Sig: Take 1 tablet (100 mg total) by mouth 2 (two) times daily.    Dispense:  14 tablet    Refill:  0    Order Specific Question:   Supervising Provider    Answer:   Merrilee Jansky [id number]     *If you need refills on other medications prior to your next appointment, please contact your pharmacy*  Follow-Up: Call back or seek an in-person evaluation if the symptoms worsen or if the condition fails to improve as anticipated.  [facility] [phone]  Other Instructions Pilonidal Cyst Drainage  A pilonidal cyst is a fluid-filled sac that forms under the skin near the tailbone, at the top of the crease of the buttocks (pilonidal area). Incision and drainage is a procedure to open and drain a pilonidal cyst. You may need this procedure if your cyst becomes infected (pilonidal abscess). A pilonidal abscess may cause pain and swelling. There are three types of procedures to drain a pilonidal cyst. The type of procedure you have will depend on the size, location, and severity of your cyst. You may have: Incision and drainage with wound packing. Wound packing involves placing a germ-free packing material (gauze) into the incision. Marsupialization. This involves opening and draining the cyst. The edges of the incision are then stitched (sutured) to the skin around the incision. Incision and drainage without wound packing. The incision is closed and covered with a dressing. Tell a health care provider about: Any allergies you have. All medicines you are taking, including vitamins, herbs, eye drops, creams, and over-the-counter medicines. Any problems you or family members have had with anesthetic medicines. Any bleeding problems you have. Any surgeries you have had. Any medical conditions you have. Whether you  are pregnant or may be pregnant. What are the risks? Talk to your health care provider about risks. These may include: Infection. Bleeding. Allergic reactions to medicines. The cyst coming back again (recurrence). What happens before the procedure? When to stop eating and drinking Follow instructions from your health care provider about  what you may eat and drink. These may include: 8 hours before your procedure Stop eating most foods. Do not eat meat, fried foods, or fatty foods. Eat only light foods, such as toast or crackers. All liquids are okay except energy drinks and alcohol. 6 hours before your procedure Stop eating. Drink only clear liquids, such as water, clear fruit juice, black coffee, plain tea, and sports drinks. Do not drink energy drinks or alcohol. 2 hours before your procedure Stop drinking all liquids. You may be allowed to take medicines with small sips of water. If you do not follow your health care provider's instructions, your procedure may be delayed or canceled. Medicines Ask your health care provider about: Changing or stopping your regular medicines. These include any diabetes medicines or blood thinners you take. Taking medicines such as aspirin and ibuprofen. These medicines can thin your blood. Do not take them unless your health care provider tells you to. Taking over-the-counter medicines, vitamins, herbs, and supplements. Surgery safety Ask your health care provider: How your surgery site will be marked. What steps will be taken to help prevent infection. These steps may include: Removing hair at the surgery site. Washing skin with a soap that kills germs. Taking antibiotics. General instructions Do not use any products that contain nicotine or tobacco for at least 4 weeks before the procedure. These products include cigarettes, chewing tobacco, and vaping devices, such as e-cigarettes. If you need help quitting, ask your health care provider. If you will be going home right after the procedure, plan to have a responsible adult: Take you home from the hospital or clinic. You will not be allowed to drive. Care for you for the time you are told. You may need help with wound care and dressing changes. What happens during the procedure? An IV may be inserted into one of your veins. You  may be given: A sedative. This helps you relax. Anesthesia. This will: Numb certain areas of your body. Make you fall asleep for surgery. You will lie down on your stomach. Tape may be used to spread your buttocks. The area around the cyst will be cleaned. An incision will be made in the cyst. Your surgeon may insert a tube with a light and camera on the end (probe). This is done to see how deep the cyst is. Fluid or pus inside the cyst will be drained. The cyst will be flushed out (irrigated). The rest of the procedure will depend on the type of procedure you are having. For incision and drainage with wound packing: Gauze will be placed into the wound. This keeps the wound open so it can keep draining after surgery. It also helps the wound heal from the inside out. The area will be covered with a dressing. For marsupialization: The edges of the incision will be stitched to your skin. This keeps the wound open while it heals. It also helps deep wounds heal from the inside out. A dressing will be placed over the wound. For incision and drainage without wound packing: Some tissue around the cyst may be removed. The incision may be closed with sutures, skin glue, or adhesive strips. The incision will be covered  with a dressing. These procedures may vary among health care providers and hospitals. What happens after the procedure? Your blood pressure, heart rate, breathing rate, and blood oxygen level will be monitored until you leave the hospital or clinic. You will be given medicine for pain as needed. If you were given a sedative during the procedure, it can affect you for several hours. Do not drive or operate machinery until your health care provider says that it is safe. Summary A pilonidal cyst is a fluid-filled sac that forms under the skin near the tailbone, at the top of the crease of the buttocks (pilonidal area). Incision and drainage is a procedure to open and drain the cyst.  The type of procedure you have will depend on the size, location, and severity of your cyst. You may need this procedure if your cyst becomes infected (pilonidal abscess). This information is not intended to replace advice given to you by your health care provider. Make sure you discuss any questions you have with your health care provider. Document Revised: 04/24/2021 Document Reviewed: 04/24/2021 Elsevier Patient Education  2024 Elsevier Inc.    If you have been instructed to have an in-person evaluation today at a local Urgent Care facility, please use the link below. It will take you to a list of all of our available [facility], including address, phone number and hours of operation. Please do not delay care.  [facility]  If you or a family member do not have a primary care provider, use the link below to schedule a visit and establish care. When you choose a [facility] primary care physician or advanced practice provider, you gain a long-term partner in health. Find a Primary Care Provider  Learn more about [facility]'s in-office and virtual care options: [facility] - Get Care Now

## 2022-12-14 ENCOUNTER — Encounter (HOSPITAL_COMMUNITY): Payer: Self-pay

## 2022-12-14 ENCOUNTER — Ambulatory Visit (HOSPITAL_COMMUNITY)
Admission: EM | Admit: 2022-12-14 | Discharge: 2022-12-14 | Disposition: A | Discharge status: Home / Self Care | Payer: Managed Care, Other (non HMO) | Attending: Nurse Practitioner | Admitting: Nurse Practitioner

## 2022-12-14 DIAGNOSIS — M26623 Arthralgia of bilateral temporomandibular joint: Secondary | ICD-10-CM | POA: Diagnosis not present

## 2022-12-14 MED ORDER — TIZANIDINE HCL 4 MG PO TABS: 4.0000 mg | ORAL_TABLET | Freq: Three times a day (TID) | ORAL | 0 refills | Status: DC | PRN

## 2022-12-14 NOTE — ED Triage Notes (Signed)
Patient c/o bilateral bilateral upper jaw pain. Patient states he is not sure if it is dental or not x 1 week.  Patient states he has been taking Ibuprofen and Tylenol. Nothing today.

## 2022-12-14 NOTE — Discharge Instructions (Addendum)
It sounds like you are having TMJ pain.  You can continue Tylenol 500 to 1000 mg every 6 hours for pain.  You can also take the muscle accident and nighttime as needed to help relax the muscle tension in your jaw.  Please follow-up with a dentist as soon as possible; contact list has been given today.

## 2022-12-14 NOTE — ED Provider Notes (Signed)
[facility]    CSN: [id number] Arrival date & time: 12/14/22  1604      History   Chief Complaint Chief Complaint  Patient presents with   Jaw Pain    HPI Jeffrey Chang is a 27 y.o. male.   Patient presents today with bilateral upper jaw pain ongoing for the past week.  Reports pain is worse after he wakes up in the morning or when he wakes up from a nap or eats meat.  Has been taking ibuprofen and Tylenol for pain without much improvement.  He does endorse teeth grinding.  Denies fevers or nausea/vomiting.    Past Medical History:  Diagnosis Date   Bipolar 1 disorder (HCC)    Manic depressive disorder [facility])     Patient Active Problem List   Diagnosis Date Noted   Cannabis abuse with psychotic disorder, with delusions (HCC) 07/07/2016    History reviewed. No pertinent surgical history.     Home Medications    Prior to Admission medications   Medication Sig Start Date End Date Taking? Authorizing Provider  tiZANidine (ZANAFLEX) 4 MG tablet Take 1 tablet (4 mg total) by mouth every 8 (eight) hours as needed for muscle spasms. Do not take with alcohol or while driving or operating heavy machinery.  May cause drowsiness. 12/14/22  Yes Valentino Nose, NP  fluticasone (FLONASE) 50 MCG/ACT nasal spray Place 2 sprays into both nostrils daily. 11/17/22   Margaretann Loveless, PA-C  ipratropium (ATROVENT) 0.03 % nasal spray Place 2 sprays into both nostrils every 12 (twelve) hours. 09/23/22   Viviano Simas, FNP    Family History History reviewed. No pertinent family history.  Social History Social History   Tobacco Use   Smoking status: Every Day    Types: Cigars   Smokeless tobacco: Never  Vaping Use   Vaping status: Never Used  Substance Use Topics   Alcohol use: Yes    Comment: Occassionally.   Drug use: Not Currently     Allergies   Latex   Review of Systems Review of Systems Per HPI  Physical Exam Triage Vital Signs ED Triage  Vitals  Encounter Vitals Group     BP 12/14/22 1633 (!) 141/89     Systolic BP Percentile --      Diastolic BP Percentile --      Pulse Rate 12/14/22 1633 78     Resp 12/14/22 1633 16     Temp 12/14/22 1633 98.3 F (36.8 C)     Temp Source 12/14/22 1633 Oral     SpO2 12/14/22 1633 97 %     Weight --      Height --      Head Circumference --      Peak Flow --      Pain Score 12/14/22 1632 7     Pain Loc --      Pain Education --      Exclude from Growth Chart --    No data found.  Updated Vital Signs BP (!) 141/89 (BP Location: Left Arm)   Pulse 78   Temp 98.3 F (36.8 C) (Oral)   Resp 16   SpO2 97%   Visual Acuity Right Eye Distance:   Left Eye Distance:   Bilateral Distance:    Right Eye Near:   Left Eye Near:    Bilateral Near:     Physical Exam Vitals and nursing note reviewed.  Constitutional:      General: He is  not in acute distress.    Appearance: Normal appearance. He is not toxic-appearing.  HENT:     Head: Normocephalic and atraumatic.     Jaw: Tenderness and pain on movement present. No swelling.     Nose: Nose normal. No congestion or rhinorrhea.     Mouth/Throat:     Mouth: Mucous membranes are moist.     Dentition: Normal dentition. No dental tenderness, gingival swelling, dental caries or dental abscesses.     Tongue: No lesions.     Palate: No mass.     Pharynx: Oropharynx is clear.  Pulmonary:     Effort: Pulmonary effort is normal. No respiratory distress.  Musculoskeletal:     Cervical back: Normal range of motion.  Lymphadenopathy:     Cervical: No cervical adenopathy.  Neurological:     Mental Status: He is alert and oriented to person, place, and time.  Psychiatric:        Behavior: Behavior is cooperative.      UC Treatments / Results  Labs (all labs ordered are listed, but only abnormal results are displayed) Labs Reviewed - No data to display  EKG   Radiology No results found.  Procedures Procedures (including  critical care time)  Medications Ordered in UC Medications - No data to display  Initial Impression / Assessment and Plan / UC Course  I have reviewed the triage vital signs and the nursing notes.  Pertinent labs & imaging results that were available during my care of the patient were reviewed by me and considered in my medical decision making (see chart for details).   Patient is well-appearing, normotensive, afebrile, not tachycardic, not tachypneic, oxygenating well on room air.    1. Bilateral temporomandibular joint pain Supportive recommendations discussed with patient, recommended Tylenol and ibuprofen for pain Can also start muscle laxer as needed for pain Recommended close follow-up with dentist and dental resources given today  The patient was given the opportunity to ask questions.  All questions answered to their satisfaction.  The patient is in agreement to this plan.    Final Clinical Impressions(s) / UC Diagnoses   Final diagnoses:  Bilateral temporomandibular joint pain     Discharge Instructions      It sounds like you are having TMJ pain.  You can continue Tylenol 500 to 1000 mg every 6 hours for pain.  You can also take the muscle accident and nighttime as needed to help relax the muscle tension in your jaw.  Please follow-up with a dentist as soon as possible; contact list has been given today.   ED Prescriptions     Medication Sig Dispense Auth. Provider   tiZANidine (ZANAFLEX) 4 MG tablet Take 1 tablet (4 mg total) by mouth every 8 (eight) hours as needed for muscle spasms. Do not take with alcohol or while driving or operating heavy machinery.  May cause drowsiness. 30 tablet Valentino Nose, NP      PDMP not reviewed this encounter.   Valentino Nose, NP 12/14/22 1712

## 2022-12-26 ENCOUNTER — Ambulatory Visit
Admission: RE | Admit: 2022-12-26 | Discharge: 2022-12-26 | Disposition: A | Discharge status: Home / Self Care | Payer: Managed Care, Other (non HMO) | Source: Ambulatory Visit | Attending: Internal Medicine

## 2022-12-26 VITALS — BP 137/84 | HR 74 | Temp 97.9°F | Resp 17

## 2022-12-26 DIAGNOSIS — H6993 Unspecified Eustachian tube disorder, bilateral: Secondary | ICD-10-CM

## 2022-12-26 DIAGNOSIS — J0100 Acute maxillary sinusitis, unspecified: Secondary | ICD-10-CM | POA: Diagnosis not present

## 2022-12-26 DIAGNOSIS — J209 Acute bronchitis, unspecified: Secondary | ICD-10-CM | POA: Diagnosis not present

## 2022-12-26 MED ORDER — PROMETHAZINE-DM 6.25-15 MG/5ML PO SYRP
5.0000 mL | ORAL_SOLUTION | Freq: Four times a day (QID) | ORAL | 0 refills | Status: DC | PRN
Start: 2022-12-26 — End: 2023-01-11

## 2022-12-26 MED ORDER — ALBUTEROL SULFATE HFA 108 (90 BASE) MCG/ACT IN AERS
1.0000 | INHALATION_SPRAY | Freq: Four times a day (QID) | RESPIRATORY_TRACT | 0 refills | Status: DC | PRN
Start: 2022-12-26 — End: 2023-05-01

## 2022-12-26 MED ORDER — AMOXICILLIN-POT CLAVULANATE 875-125 MG PO TABS
1.0000 | ORAL_TABLET | Freq: Two times a day (BID) | ORAL | 0 refills | Status: DC
Start: 2022-12-26 — End: 2023-01-13

## 2022-12-26 MED ORDER — PREDNISONE 20 MG PO TABS
40.0000 mg | ORAL_TABLET | Freq: Every day | ORAL | 0 refills | Status: AC
Start: 2022-12-26 — End: 2022-12-31

## 2022-12-26 NOTE — ED Triage Notes (Signed)
Pt presents with c/o sob, sore throat, weakness and head pressure X 2 wks and has worsened within the last 3 days.   Home interventions: otc decongestants

## 2022-12-26 NOTE — Discharge Instructions (Signed)
Start Augmentin twice daily for 7 days.  Promethazine DM as needed for cough.  Please of this medication can make you drowsy.  Do not drink alcohol drive on this medication.  Prednisone daily for 5 days.  Albuterol inhaler as needed for shortness of breath.  Lots of rest and fluids.  Please follow-up with your PCP if your symptoms do not improve.  Please go to the ER for any worsening symptoms.  Hope you feel better soon!

## 2022-12-26 NOTE — ED Provider Notes (Signed)
[facility]    CSN: [id number] Arrival date & time: 12/26/22  7829      History   Chief Complaint Chief Complaint  Patient presents with   Dizziness    Feel light headed and can't breathe - Entered by patient   Weakness   Sore Throat    HPI Jeffrey Chang is a 27 y.o. male  presents for evaluation of URI symptoms for 2 weeks. Patient reports associated symptoms of cough, congestion, dizziness, ear pain, sinus pressure/pain with purulent nasal discharge, sinus headache, ST, and shortness of breath. Denies N/V/D, fevers, body aches. Patient does not have a hx of asthma. Patient does actively smoke.   Pt has taken decongestants OTC for symptoms. Pt has no other concerns at this time.    Dizziness Associated symptoms: headaches and weakness   Weakness Associated symptoms: cough, dizziness and headaches   Sore Throat Associated symptoms include headaches.    Past Medical History:  Diagnosis Date   Bipolar 1 disorder (HCC)    Manic depressive disorder [facility])     Patient Active Problem List   Diagnosis Date Noted   Cannabis abuse with psychotic disorder, with delusions (HCC) 07/07/2016    History reviewed. No pertinent surgical history.     Home Medications    Prior to Admission medications   Medication Sig Start Date End Date Taking? Authorizing Provider  albuterol (VENTOLIN HFA) 108 (90 Base) MCG/ACT inhaler Inhale 1-2 puffs into the lungs every 6 (six) hours as needed for wheezing or shortness of breath. 12/26/22  Yes Radford Pax, NP  amoxicillin-clavulanate (AUGMENTIN) 875-125 MG tablet Take 1 tablet by mouth every 12 (twelve) hours. 12/26/22  Yes Radford Pax, NP  predniSONE (DELTASONE) 20 MG tablet Take 2 tablets (40 mg total) by mouth daily with breakfast for 5 days. 12/26/22 12/31/22 Yes Radford Pax, NP  promethazine-dextromethorphan (PROMETHAZINE-DM) 6.25-15 MG/5ML syrup Take 5 mLs by mouth 4 (four) times daily as needed for cough. 12/26/22   Yes Radford Pax, NP  fluticasone (FLONASE) 50 MCG/ACT nasal spray Place 2 sprays into both nostrils daily. 11/17/22   Margaretann Loveless, PA-C  ipratropium (ATROVENT) 0.03 % nasal spray Place 2 sprays into both nostrils every 12 (twelve) hours. 09/23/22   Viviano Simas, FNP  tiZANidine (ZANAFLEX) 4 MG tablet Take 1 tablet (4 mg total) by mouth every 8 (eight) hours as needed for muscle spasms. Do not take with alcohol or while driving or operating heavy machinery.  May cause drowsiness. 12/14/22   Valentino Nose, NP    Family History History reviewed. No pertinent family history.  Social History Social History   Tobacco Use   Smoking status: Every Day    Types: Cigars   Smokeless tobacco: Never  Vaping Use   Vaping status: Never Used  Substance Use Topics   Alcohol use: Yes    Comment: Occassionally.   Drug use: Not Currently     Allergies   Latex   Review of Systems Review of Systems  HENT:  Positive for congestion, sinus pressure, sinus pain and sore throat.   Respiratory:  Positive for cough.   Neurological:  Positive for dizziness, weakness and headaches.     Physical Exam Triage Vital Signs ED Triage Vitals [12/26/22 1033]  Encounter Vitals Group     BP 137/84     Systolic BP Percentile      Diastolic BP Percentile      Pulse Rate 74     Resp  17     Temp 97.9 F (36.6 C)     Temp Source Oral     SpO2 98 %     Weight      Height      Head Circumference      Peak Flow      Pain Score 7     Pain Loc      Pain Education      Exclude from Growth Chart    No data found.  Updated Vital Signs BP 137/84 (BP Location: Left Arm)   Pulse 74   Temp 97.9 F (36.6 C) (Oral)   Resp 17   SpO2 98%   Visual Acuity Right Eye Distance:   Left Eye Distance:   Bilateral Distance:    Right Eye Near:   Left Eye Near:    Bilateral Near:     Physical Exam Vitals and nursing note reviewed.  Constitutional:      General: He is not in acute distress.     Appearance: Normal appearance. He is not ill-appearing or toxic-appearing.  HENT:     Head: Normocephalic and atraumatic.     Right Ear: Ear canal normal. A middle ear effusion is present. Tympanic membrane is not erythematous.     Left Ear: Ear canal normal. A middle ear effusion is present. Tympanic membrane is not erythematous.     Nose: Congestion present.     Right Turbinates: Swollen and pale.     Left Turbinates: Swollen and pale.     Right Sinus: Maxillary sinus tenderness present. No frontal sinus tenderness.     Left Sinus: Maxillary sinus tenderness present. No frontal sinus tenderness.     Mouth/Throat:     Mouth: Mucous membranes are moist.     Pharynx: Posterior oropharyngeal erythema present.  Eyes:     Pupils: Pupils are equal, round, and reactive to light.  Cardiovascular:     Rate and Rhythm: Normal rate and regular rhythm.     Heart sounds: Normal heart sounds.  Pulmonary:     Effort: Pulmonary effort is normal.     Breath sounds: Normal breath sounds.  Musculoskeletal:     Cervical back: Normal range of motion and neck supple.  Lymphadenopathy:     Cervical: No cervical adenopathy.  Skin:    General: Skin is warm and dry.  Neurological:     General: No focal deficit present.     Mental Status: He is alert and oriented to person, place, and time.  Psychiatric:        Mood and Affect: Mood normal.        Behavior: Behavior normal.      UC Treatments / Results  Labs (all labs ordered are listed, but only abnormal results are displayed) Labs Reviewed - No data to display  EKG   Radiology No results found.  Procedures Procedures (including critical care time)  Medications Ordered in UC Medications - No data to display  Initial Impression / Assessment and Plan / UC Course  I have reviewed the triage vital signs and the nursing notes.  Pertinent labs & imaging results that were available during my care of the patient were reviewed by me and  considered in my medical decision making (see chart for details).     Reviewed exam and symptoms with patient.  No red flags.  Will start Augmentin for sinusitis given length of symptoms.  Patient states he does not tolerate nasal sprays.  Promethazine DM  as needed for cough, side effect profile reviewed.  Albuterol inhaler as needed for shortness of breath.  Prednisone daily for 5 days for sinusitis/eustachian tube dysfunction.  PCP follow-up 2 to 3 days for recheck.  ER precautions reviewed. Final Clinical Impressions(s) / UC Diagnoses   Final diagnoses:  Acute maxillary sinusitis, recurrence not specified  Dysfunction of both eustachian tubes  Acute bronchitis, unspecified organism     Discharge Instructions      Start Augmentin twice daily for 7 days.  Promethazine DM as needed for cough.  Please of this medication can make you drowsy.  Do not drink alcohol drive on this medication.  Prednisone daily for 5 days.  Albuterol inhaler as needed for shortness of breath.  Lots of rest and fluids.  Please follow-up with your PCP if your symptoms do not improve.  Please go to the ER for any worsening symptoms.  Hope you feel better soon!    ED Prescriptions     Medication Sig Dispense Auth. Provider   amoxicillin-clavulanate (AUGMENTIN) 875-125 MG tablet Take 1 tablet by mouth every 12 (twelve) hours. 14 tablet Radford Pax, NP   promethazine-dextromethorphan (PROMETHAZINE-DM) 6.25-15 MG/5ML syrup Take 5 mLs by mouth 4 (four) times daily as needed for cough. 118 mL Radford Pax, NP   albuterol (VENTOLIN HFA) 108 (90 Base) MCG/ACT inhaler Inhale 1-2 puffs into the lungs every 6 (six) hours as needed for wheezing or shortness of breath. 1 each Radford Pax, NP   predniSONE (DELTASONE) 20 MG tablet Take 2 tablets (40 mg total) by mouth daily with breakfast for 5 days. 10 tablet Radford Pax, NP      PDMP not reviewed this encounter.   Radford Pax, NP 12/26/22 1106

## 2023-01-03 ENCOUNTER — Telehealth: Payer: Managed Care, Other (non HMO) | Admitting: Nurse Practitioner

## 2023-01-03 DIAGNOSIS — B356 Tinea cruris: Secondary | ICD-10-CM

## 2023-01-03 MED ORDER — FLUCONAZOLE 150 MG PO TABS: 150.0000 mg | ORAL_TABLET | Freq: Once | ORAL | 0 refills | Status: AC

## 2023-01-03 NOTE — Progress Notes (Signed)
E-Visit for [employer]  We are sorry that you are not feeling well. Here is how we plan to help!  Based on what you shared with me it looks like you have tinea cruris, or "Jock Itch".  The symptoms of Jock Itch include red, peeling, itchy rash that affects the groin (crease where the leg meets the trunk).  This fungal infection can be spread through shared towels, clothing, bedding, or hard surfaces (particularly in moist areas) such as shower stalls, locker room floors, or pool area that has the fungus present. If you have a fungal infection on one part of your body, you can also spread it to other parts. For instance, men with a fungal infection on their feet sometimes spread it to their groin.   I am prescribing:Fluconazole 150 mg 1 tablet x 1   HOME CARE:  Keep affected area clean, dry, and cool. Wash with soap and shampoo after sports or exercise and dry yourself well after bathing or swimming Wear cotton underwear and change them if they become damp or sweaty. Avoid using swimming pools, public showers, or baths.  GET HELP RIGHT AWAY IF:  Symptoms that don't away after treatment. Severe itching that persists. If your rash spreads or swells. If your rash begins to have drainage or smell. You develop a fever.  MAKE SURE YOU   Understand these instructions. Will watch your condition. Will get help right away if you are not doing well or get worse.  Thank you for choosing an e-visit.  Your e-visit answers were reviewed by a board certified advanced clinical practitioner to complete your personal care plan. Depending upon the condition, your plan could have included both over the counter or prescription medications.  Please review your pharmacy choice. Make sure the pharmacy is open so you can pick up prescription now. If there is a problem, you may contact your provider through [employer] and have the prescription routed to another pharmacy.  Your safety is important to [place].  If you have drug allergies check your prescription carefully.   For the next 24 hours you can use MyChart to ask questions about today's visit, request a non-urgent call back, or ask for a work or school excuse. You will get an email in the next two days asking about your experience. I hope that your e-visit has been valuable and will speed your recovery.   References or for more information:  [email or web address]=search_result&selectedTitle=3~52&usage_type=default&display_rank=3

## 2023-01-03 NOTE — Progress Notes (Signed)
I have spent 5 minutes in review of e-visit questionnaire, review and updating patient chart, medical decision making and response to patient.  ° °Jerrell Mangel W Secilia Apps, NP ° °  °

## 2023-01-11 ENCOUNTER — Telehealth: Payer: Managed Care, Other (non HMO) | Admitting: Family

## 2023-01-11 DIAGNOSIS — H00013 Hordeolum externum right eye, unspecified eyelid: Secondary | ICD-10-CM | POA: Diagnosis not present

## 2023-01-11 MED ORDER — BACITRACIN-POLYMYXIN B 500-10000 UNIT/GM OP OINT: 1.0000 | TOPICAL_OINTMENT | Freq: Three times a day (TID) | OPHTHALMIC | 0 refills | Status: DC

## 2023-01-11 NOTE — Progress Notes (Signed)
  E-Visit for [employer]   We are sorry that you are not feeling well. Here is how we plan to help!  Based on what you have shared with me it looks like you have a stye.  A stye is an inflammation of the eyelid.  It is often a red, painful lump near the edge of the eyelid that may look like a boil or a pimple.  A stye develops when an infection occurs at the base of an eyelash.   We have made appropriate suggestions for you based upon your presentation: Simple styes can be treated without medical intervention.  Most styes either resolve spontaneously or resolve with simple home treatment by applying warm compresses or heated washcloth to the stye for about 10-15 minutes three to four times a day. This causes the stye to drain and resolve.  I have sent over bacitracin-polysporin ophthalmic ointment to your eye.   HOME CARE:  Wash your hands often! Let the stye open on its own. Don't squeeze or open it. Don't rub your eyes. This can irritate your eyes and let in bacteria.  If you need to touch your eyes, wash your hands first. Don't wear eye makeup or contact lenses until the area has healed.  GET HELP RIGHT AWAY IF:  Your symptoms do not improve. You develop blurred or loss of vision. Your symptoms worsen (increased discharge, pain or redness).   Thank you for choosing an e-visit.  Your e-visit answers were reviewed by a board certified advanced clinical practitioner to complete your personal care plan. Depending upon the condition, your plan could have included both over the counter or prescription medications.  Please review your pharmacy choice. Make sure the pharmacy is open so you can pick up prescription now. If there is a problem, you may contact your provider through [employer] and have the prescription routed to another pharmacy.  Your safety is important to [place]. If you have drug allergies check your prescription carefully.   For the next 24 hours you can use MyChart to ask  questions about today's visit, request a non-urgent call back, or ask for a work or school excuse. You will get an email in the next two days asking about your experience. I hope that your e-visit has been valuable and will speed your recovery.   Approximately 5 minutes was spent documenting and reviewing patient's chart.

## 2023-01-13 ENCOUNTER — Telehealth: Payer: Managed Care, Other (non HMO) | Admitting: Physician Assistant

## 2023-01-13 DIAGNOSIS — L739 Follicular disorder, unspecified: Secondary | ICD-10-CM | POA: Diagnosis not present

## 2023-01-13 MED ORDER — CEPHALEXIN 500 MG PO CAPS: 500.0000 mg | ORAL_CAPSULE | Freq: Three times a day (TID) | ORAL | 0 refills | Status: AC

## 2023-01-13 NOTE — Patient Instructions (Signed)
[name], thank you for joining [name], PA-C for today's virtual visit.  While this provider is not your primary care provider (PCP), if your PCP is located in our provider database this encounter information will be shared with them immediately following your visit.   A [facility] MyChart account gives you access to today's visit and all your visits, tests, and labs performed at [facility] " click here if you don't have a [facility] MyChart account or go to [email or web address]  Consent: (Patient) Jeffrey Chang provided verbal consent for this virtual visit at the beginning of the encounter.  Current Medications:  Current Outpatient Medications:    cephALEXin (KEFLEX) 500 MG capsule, Take 1 capsule (500 mg total) by mouth 3 (three) times daily for 7 days., Disp: 21 capsule, Rfl: 0   albuterol (VENTOLIN HFA) 108 (90 Base) MCG/ACT inhaler, Inhale 1-2 puffs into the lungs every 6 (six) hours as needed for wheezing or shortness of breath., Disp: 1 each, Rfl: 0   bacitracin-polymyxin b (POLYSPORIN) ophthalmic ointment, Place 1 Application into the right eye 3 (three) times daily., Disp: 3.5 g, Rfl: 0   fluticasone (FLONASE) 50 MCG/ACT nasal spray, Place 2 sprays into both nostrils daily., Disp: 16 g, Rfl: 0   ipratropium (ATROVENT) 0.03 % nasal spray, Place 2 sprays into both nostrils every 12 (twelve) hours., Disp: 30 mL, Rfl: 12   tiZANidine (ZANAFLEX) 4 MG tablet, Take 1 tablet (4 mg total) by mouth every 8 (eight) hours as needed for muscle spasms. Do not take with alcohol or while driving or operating heavy machinery.  May cause drowsiness., Disp: 30 tablet, Rfl: 0   Medications ordered in this encounter:  Meds ordered this encounter  Medications   cephALEXin (KEFLEX) 500 MG capsule    Sig: Take 1 capsule (500 mg total) by mouth 3 (three) times daily for 7 days.    Dispense:  21 capsule    Refill:  0    Order Specific Question:   Supervising  Provider    Answer:   Merrilee Jansky [id number]     *If you need refills on other medications prior to your next appointment, please contact your pharmacy*  Follow-Up: Call back or seek an in-person evaluation if the symptoms worsen or if the condition fails to improve as anticipated.   Virtual Care [phone]  Other Instructions Folliculitis  Folliculitis occurs when hair follicles become inflamed. A hair follicle is a tiny opening in your skin where your hair grows from. This condition often occurs on the scalp, thighs, legs, back, and buttocks but can happen anywhere on the body. What are the causes? A common cause of this condition is an infection from bacteria. The type of folliculitis caused by bacteria can last a long time or go away and come back. The bacteria can live anywhere on your skin. They are often found in the nostrils. Other causes may include: An infection from a fungus. An infection from a virus. Your skin touching some chemicals, such as oils and tars. Shaving or waxing. Greasy ointments or creams put on the skin. What increases the risk? You are more likely to develop this condition if: Your body has a weak disease-fighting system (immune system). You have diabetes. You are obese. What are the signs or symptoms? Symptoms of this condition include: Redness. Soreness. Swelling. Itching. Small white or yellow, itchy spots filled with pus (pustules) that appear over a red area. If the infection goes deep into  the follicle, these may turn into a boil (furuncle). A group of boils (carbuncle). These tend to form in hairy, sweaty areas of the body. How is this diagnosed? This condition is diagnosed with a skin exam. Your health care provider may take a sample of one of the pustules or boils to test in a lab. How is this treated? This condition may be treated by: Putting a warm, wet cloth (warm compress) on the affected areas. Taking antibiotics  or applying them to the skin. Applying or bathing with a solution that kills germs (antiseptic). Taking an over-the-counter medicine. This can help with itching. Having a procedure to drain pustules or boils. This may be done if a pustule or boil contains a lot of pus or fluid. Having laser hair removal. This may be done when the condition lasts for a long time. Follow these instructions at home: Managing pain and swelling  If directed, apply heat to the affected area as often as told by your health care provider. Use the heat source that your health care provider recommends, such as a moist heat pack or a heating pad. Place a towel between your skin and the heat source. Leave the heat on for 20-30 minutes. If your skin turns bright red, remove the heat right away to prevent burns. The risk of burns is higher if you cannot feel pain, heat, or cold. General instructions Take over-the-counter and prescription medicines only as told by your health care provider. If you were prescribed antibiotics, take or apply them as told by your health care provider. Do not stop using the antibiotic even if you start to feel better. Check your irritated area every day for signs of infection. Check for: More redness, swelling, or pain. Fluid or blood. Warmth. Pus or a bad smell. Do not shave irritated skin. Keep all follow-up visits. Your health care provider will check if the treatments are helping. Contact a health care provider if: You have a fever. You have any signs of infection. Red streaks are spreading from the affected area. This information is not intended to replace advice given to you by your health care provider. Make sure you discuss any questions you have with your health care provider. Document Revised: 07/02/2021 Document Reviewed: 07/02/2021 Elsevier Patient Education  2024 Elsevier Inc.    If you have been instructed to have an in-person evaluation today at a local Urgent Care  facility, please use the link below. It will take you to a list of all of our available [facility], including address, phone number and hours of operation. Please do not delay care.  [facility]  If you or a family member do not have a primary care provider, use the link below to schedule a visit and establish care. When you choose a [facility] primary care physician or advanced practice provider, you gain a long-term partner in health. Find a Primary Care Provider  Learn more about [facility]'s in-office and virtual care options: [facility] - Get Care Now

## 2023-01-13 NOTE — Progress Notes (Signed)
Virtual Visit Consent   Jeffrey Chang, you are scheduled for a virtual visit with a [facility] provider today. Just as with appointments in the office, your consent must be obtained to participate. Your consent will be active for this visit and any virtual visit you may have with one of our providers in the next 365 days. If you have a MyChart account, a copy of this consent can be sent to you electronically.  As this is a virtual visit, video technology does not allow for your provider to perform a traditional examination. This may limit your provider's ability to fully assess your condition. If your provider identifies any concerns that need to be evaluated in person or the need to arrange testing (such as labs, EKG, etc.), we will make arrangements to do so. Although advances in technology are sophisticated, we cannot ensure that it will always work on either your end or our end. If the connection with a video visit is poor, the visit may have to be switched to a telephone visit. With either a video or telephone visit, we are not always able to ensure that we have a secure connection.  By engaging in this virtual visit, you consent to the provision of healthcare and authorize for your insurance to be billed (if applicable) for the services provided during this visit. Depending on your insurance coverage, you may receive a charge related to this service.  I need to obtain your verbal consent now. Are you willing to proceed with your visit today? Jeffrey Chang has provided verbal consent on 01/13/2023 for a virtual visit (video or telephone). Jeffrey Chang, [place]  Date: 01/13/2023 5:59 PM  Virtual Visit via Video Note   I, Jeffrey Chang, connected with  Jeffrey Chang  ([id number], October 26, 1995) on 01/13/23 at  5:45 PM EST by a video-enabled telemedicine application and verified that I am speaking with the correct person using two identifiers.  Location: Patient: Virtual Visit Location  Patient: Home Provider: Virtual Visit Location Provider: Home Office   I discussed the limitations of evaluation and management by telemedicine and the availability of in person appointments. The patient expressed understanding and agreed to proceed.    History of Present Illness: Jeffrey Chang is a 27 y.o. who identifies as a male who was assigned male at birth, and is being seen today for inflamed and painful hair follicles underneath his R armpit and in the R inguinal region. First noted over past couple of days but worsening today. 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 Notes inflamed hair follicle with purulent drainage.  R inguinal region. Similar area with redness and sternness. No active drainage in the area.   HPI: HPI  Problems:  Patient Active Problem List   Diagnosis Date Noted   Cannabis abuse with psychotic disorder, with delusions (HCC) 07/07/2016    Allergies:  Allergies  Allergen Reactions   Latex    Medications:  Current Outpatient Medications:    albuterol (VENTOLIN HFA) 108 (90 Base) MCG/ACT inhaler, Inhale 1-2 puffs into the lungs every 6 (six) hours as needed for wheezing or shortness of breath., Disp: 1 each, Rfl: 0   bacitracin-polymyxin b (POLYSPORIN) ophthalmic ointment, Place 1 Application into the right eye 3 (three) times daily., Disp: 3.5 g, Rfl: 0   fluticasone (FLONASE) 50 MCG/ACT nasal spray, Place 2 sprays into both nostrils daily., Disp: 16 g, Rfl: 0   ipratropium (ATROVENT) 0.03 % nasal spray, Place 2 sprays into  both nostrils every 12 (twelve) hours., Disp: 30 mL, Rfl: 12   tiZANidine (ZANAFLEX) 4 MG tablet, Take 1 tablet (4 mg total) by mouth every 8 (eight) hours as needed for muscle spasms. Do not take with alcohol or while driving or operating heavy machinery.  May cause drowsiness., Disp: 30 tablet, Rfl: 0  Observations/Objective: Patient is well-developed, well-nourished in no acute distress.  Resting  comfortably  at home.  Head is normocephalic, atraumatic.  No labored breathing.  Speech is clear and coherent with logical content.  Patient is alert and oriented at baseline.  Unable to visualize areas as patient is in the dark without proper lighting in his work truck (parked)  Assessment and Plan: 1. Folliculitis  Folliculitis versus start of abscess. Supportive measures and OTC medications reviewed. Keflex per orders. In person follow-up precautions discussed.   Follow Up Instructions: I discussed the assessment and treatment plan with the patient. The patient was provided an opportunity to ask questions and all were answered. The patient agreed with the plan and demonstrated an understanding of the instructions.  A copy of instructions were sent to the patient via MyChart unless otherwise noted below.   The patient was advised to call back or seek an in-person evaluation if the symptoms worsen or if the condition fails to improve as anticipated.    Jeffrey Climes, PA-C

## 2023-01-20 ENCOUNTER — Ambulatory Visit: Payer: Managed Care, Other (non HMO)

## 2023-01-23 ENCOUNTER — Ambulatory Visit
Admission: EM | Admit: 2023-01-23 | Discharge: 2023-01-23 | Disposition: A | Discharge status: Home / Self Care | Payer: Managed Care, Other (non HMO) | Attending: Emergency Medicine | Admitting: Emergency Medicine

## 2023-01-23 ENCOUNTER — Ambulatory Visit: Payer: Managed Care, Other (non HMO)

## 2023-01-23 DIAGNOSIS — M25511 Pain in right shoulder: Secondary | ICD-10-CM | POA: Insufficient documentation

## 2023-01-23 DIAGNOSIS — Z113 Encounter for screening for infections with a predominantly sexual mode of transmission: Secondary | ICD-10-CM | POA: Diagnosis not present

## 2023-01-23 DIAGNOSIS — M25512 Pain in left shoulder: Secondary | ICD-10-CM | POA: Diagnosis not present

## 2023-01-23 DIAGNOSIS — X503XXA Overexertion from repetitive movements, initial encounter: Secondary | ICD-10-CM | POA: Insufficient documentation

## 2023-01-23 MED ORDER — DICLOFENAC SODIUM 75 MG PO TBEC: 75.0000 mg | DELAYED_RELEASE_TABLET | Freq: Two times a day (BID) | ORAL | 0 refills | Status: AC

## 2023-01-23 MED ORDER — KETOROLAC TROMETHAMINE 30 MG/ML IJ SOLN
30.0000 mg | Freq: Once | INTRAMUSCULAR | Status: AC
  Administered 2023-01-23: 30 mg via INTRAMUSCULAR

## 2023-01-23 NOTE — Discharge Instructions (Addendum)
The mainstay of therapy for musculoskeletal pain is reduction of inflammation and relaxation of tension which is causing inflammation.  Keep in mind, pain always begets more pain.  To help you stay ahead of your pain and inflammation, I have provided the following regimen for you:   During your visit today, you received an injection of ketorolac, high-dose nonsteroidal anti-inflammatory pain medication that should significantly reduce your pain for the next 6 to 8 hours.   Tomorrow morning, please begin taking Diclofenac 75 mg twice daily.  Please keep in mind that it is always easier to treat a little bit of pain that is to treat a lot of pain.  I recommend that you take this medication on a scheduled basis until your pain has completely resolved.   During the day, please set aside time to apply ice to the affected area 4 times daily for 20 minutes each application.  This can be achieved by using a bag of frozen peas or corn, a Ziploc bag filled with ice and water, or Ziploc bag filled with half rubbing alcohol and half Dawn dish detergent, frozen into a slush.  Please be careful not to apply ice directly to your skin, always place a soft cloth between you and the ice pack.  Over-the-counter products such as IcyHot and Biofreeze do not work nearly as well.   Please consider discussing referral to physical therapy with your primary care provider.  Physical therapist are very good at teasing out the underlying cause of acute musculoskeletal pain and helping with prevention of future recurrences.  The results of your STD testing today which screens for gonorrhea, chlamydia, and trichomonas will be made posted to your MyChart account once it is complete.  This typically takes 2 to 4 days.  Please abstain from sexual intercourse of any kind, vaginal, oral or anal, until you have received the results of your STD testing.      If any of your results are abnormal, you will receive a phone call regarding  treatment.  Prescriptions, if any are needed, will be provided for you at your pharmacy.     Thank you for visiting [facility] today.  We appreciate the opportunity to participate in your care.

## 2023-01-23 NOTE — ED Provider Notes (Signed)
[name]    CSN: [id number] Arrival date & time: 01/23/23  1623    HISTORY   Chief Complaint  Patient presents with   Shoulder Pain   HPI Jeffrey Chang is a pleasant, 27 y.o. male who presents to urgent care today. Patient complains of a 2-week history of right-sided shoulder pain.  Patient states has been taking ibuprofen with minimal relief.  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  Patient states he does not currently work out other than the strenuous activity he does when working.  He is also requesting a routine STI check, denies symptoms of STI.   Shoulder Pain  Past Medical History:  Diagnosis Date   Bipolar 1 disorder (HCC)    Manic depressive disorder [facility])    Patient Active Problem List   Diagnosis Date Noted   Cannabis abuse with psychotic disorder, with delusions (HCC) 07/07/2016   History reviewed. No pertinent surgical history.  Home Medications    Prior to Admission medications   Medication Sig Start Date End Date Taking? Authorizing Provider  albuterol (VENTOLIN HFA) 108 (90 Base) MCG/ACT inhaler Inhale 1-2 puffs into the lungs every 6 (six) hours as needed for wheezing or shortness of breath. 12/26/22   Radford Pax, NP  bacitracin-polymyxin b (POLYSPORIN) ophthalmic ointment Place 1 Application into the right eye 3 (three) times daily. 01/11/23   Junie Spencer, FNP  fluticasone (FLONASE) 50 MCG/ACT nasal spray Place 2 sprays into both nostrils daily. 11/17/22   Margaretann Loveless, PA-C  ipratropium (ATROVENT) 0.03 % nasal spray Place 2 sprays into both nostrils every 12 (twelve) hours. 09/23/22   Viviano Simas, FNP  tiZANidine (ZANAFLEX) 4 MG tablet Take 1 tablet (4 mg total) by mouth every 8 (eight) hours as needed for muscle spasms. Do  not take with alcohol or while driving or operating heavy machinery.  May cause drowsiness. 12/14/22   Valentino Nose, NP    Family History No family history on file. Social History Social History   Tobacco Use   Smoking status: Every Day    Types: Cigars   Smokeless tobacco: Never  Vaping Use   Vaping status: Never Used  Substance Use Topics   Alcohol use: Yes    Comment: Occassionally.   Drug use: Not Currently   Allergies   Latex  Review of Systems Review of Systems Pertinent findings revealed after performing a 14 point review of systems has been noted in the history of present illness.  Physical Exam Vital Signs BP 128/83 (BP Location: Left Arm)   Pulse 92   Temp 98.8 F (37.1 C) (Oral)   Resp 18   Ht 6' (1.829 m)   Wt 185 lb (83.9 kg)   SpO2 97%   BMI 25.09 kg/m   No data found.  Physical Exam Vitals and nursing note reviewed.  Constitutional:      General: He is not in acute distress.    Appearance: Normal appearance. He is normal weight. He is not ill-appearing.  HENT:     Head: Normocephalic and atraumatic.  Eyes:     General: Lids are normal.        Right eye: No discharge.        Left eye: No discharge.  Extraocular Movements: Extraocular movements intact.     Conjunctiva/sclera: Conjunctivae normal.     Right eye: Right conjunctiva is not injected.     Left eye: Left conjunctiva is not injected.     Pupils: Pupils are equal, round, and reactive to light.  Neck:     Trachea: Trachea and phonation normal.  Cardiovascular:     Rate and Rhythm: Normal rate and regular rhythm.     Pulses: Normal pulses.     Heart sounds: Normal heart sounds. No murmur heard.    No friction rub. No gallop.  Pulmonary:     Effort: Pulmonary effort is normal. No accessory muscle usage, prolonged expiration or respiratory distress.     Breath sounds: Normal breath sounds. No stridor, decreased air movement or transmitted upper airway sounds. No decreased  breath sounds, wheezing, rhonchi or rales.  Chest:     Chest wall: No tenderness.  Genitourinary:    Comments: Pt politely declines GU exam, pt did provide a penile swab for testing.   Musculoskeletal:        General: Normal range of motion.     Right shoulder: Tenderness present. No swelling, deformity, effusion, bony tenderness or crepitus. Normal range of motion. Normal strength.     Cervical back: Normal range of motion and neck supple. Normal range of motion.  Lymphadenopathy:     Cervical: No cervical adenopathy.  Skin:    General: Skin is warm and dry.     Findings: No erythema or rash.  Neurological:     General: No focal deficit present.     Mental Status: He is alert and oriented to person, place, and time. Mental status is at baseline.  Psychiatric:        Mood and Affect: Mood normal.        Behavior: Behavior normal.        Thought Content: Thought content normal.        Judgment: Judgment normal.     UC Couse / Diagnostics / Procedures:     Radiology No results found.  Procedures Procedures (including critical care time) EKG  Pending results:  Labs Reviewed  CYTOLOGY, (ORAL, ANAL, URETHRAL) ANCILLARY ONLY    Medications Ordered in UC: Medications  ketorolac (TORADOL) 30 MG/ML injection 30 mg (has no administration in time range)    UC Diagnoses / Final Clinical Impressions(s)   I have reviewed the triage vital signs and the nursing notes.  Pertinent labs & imaging results that were available during my care of the patient were reviewed by me and considered in my medical decision making (see chart for details).    Final diagnoses:  Pain in joint of right shoulder  Repetitive stress injury    Patient was provided with an injection of ketorolac during their visit today for acute pain relief.  Patient was advised to begin diclofenac 75 mg twice daily tomorrow morning until pain is resolved.  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  Conservative care recommended.  Return precautions advised.  Please see discharge instructions below for details of plan of care as provided to patient. ED Prescriptions     Medication Sig Dispense Auth. Provider   diclofenac (VOLTAREN) 75 MG EC tablet Take 1 tablet (75 mg total) by mouth 2 (two) times daily. 60 tablet Theadora Rama Scales,  PA-C      PDMP not reviewed this encounter.    Discharge Instructions      The mainstay of therapy for musculoskeletal pain is reduction of inflammation and relaxation of tension which is causing inflammation.  Keep in mind, pain always begets more pain.  To help you stay ahead of your pain and inflammation, I have provided the following regimen for you:   During your visit today, you received an injection of ketorolac, high-dose nonsteroidal anti-inflammatory pain medication that should significantly reduce your pain for the next 6 to 8 hours.   Tomorrow morning, please begin taking Diclofenac 75 mg twice daily.  Please keep in mind that it is always easier to treat a little bit of pain that is to treat a lot of pain.  I recommend that you take this medication on a scheduled basis until your pain has completely resolved.   During the day, please set aside time to apply ice to the affected area 4 times daily for 20 minutes each application.  This can be achieved by using a bag of frozen peas or corn, a Ziploc bag filled with ice and water, or Ziploc bag filled with half rubbing alcohol and half Dawn dish detergent, frozen into a slush.  Please be careful not to apply ice directly to your skin, always place a soft cloth between you and the ice pack.  Over-the-counter products such as IcyHot and Biofreeze do not work nearly as well.   Please consider discussing referral to physical therapy  with your primary care provider.  Physical therapist are very good at teasing out the underlying cause of acute musculoskeletal pain and helping with prevention of future recurrences.  The results of your STD testing today which screens for gonorrhea, chlamydia, and trichomonas will be made posted to your MyChart account once it is complete.  This typically takes 2 to 4 days.  Please abstain from sexual intercourse of any kind, vaginal, oral or anal, until you have received the results of your STD testing.      If any of your results are abnormal, you will receive a phone call regarding treatment.  Prescriptions, if any are needed, will be provided for you at your pharmacy.     Thank you for visiting [facility] today.  We appreciate the opportunity to participate in your care.       Disposition Upon Discharge:  Condition: stable for discharge home Home: take medications as prescribed; routine discharge instructions as discussed; follow up as advised.  Patient presented with an acute illness with associated systemic symptoms and significant discomfort requiring urgent management. In my opinion, this is a condition that a prudent lay person (someone who possesses an average knowledge of health and medicine) may potentially expect to result in complications if not addressed urgently such as respiratory distress, impairment of bodily function or dysfunction of bodily organs.   Routine symptom specific, illness specific and/or disease specific instructions were discussed with the patient and/or caregiver at length.   As such, the patient has been evaluated and assessed, work-up was performed and treatment was provided in alignment with urgent care protocols and evidence based medicine.  Patient/parent/caregiver has been advised that the patient may require follow up for further testing and treatment if the symptoms continue in spite of treatment, as clinically indicated and  appropriate.  Patient/parent/caregiver has been advised to report to orthopedic urgent care clinic or return to the [facility] or PCP in 3-5 days if  no better; follow-up with orthopedics, PCP or the Emergency Department if new signs and symptoms develop or if the current signs or symptoms continue to change or worsen for further workup, evaluation and treatment as clinically indicated and appropriate  The patient will follow up with their current PCP if and as advised. If the patient does not currently have a PCP we will have assisted them in obtaining one.   The patient may need specialty follow up if the symptoms continue, in spite of conservative treatment and management, for further workup, evaluation, consultation and treatment as clinically indicated and appropriate.  Patient/parent/caregiver verbalized understanding and agreement of plan as discussed.  All questions were addressed during visit.  Please see discharge instructions below for further details of plan.  This office note has been dictated using Teaching laboratory technician.  Unfortunately, this method of dictation can sometimes lead to typographical or grammatical errors.  I apologize for your inconvenience in advance if this occurs.  Please do not hesitate to reach out to me if clarification is needed.      Theadora Rama Scales, [place] 01/23/23 2211

## 2023-01-23 NOTE — ED Triage Notes (Addendum)
Patient presents with right shoulder pain x 2 weeks. Treated with Ibuprofen with minimal relief.  Patient states he a STI check. No present symptoms.

## 2023-01-26 LAB — CYTOLOGY, (ORAL, ANAL, URETHRAL) ANCILLARY ONLY
Chlamydia: NEGATIVE
Comment: NEGATIVE
Comment: NEGATIVE
Comment: NORMAL
Neisseria Gonorrhea: NEGATIVE
Trichomonas: NEGATIVE

## 2023-01-27 ENCOUNTER — Telehealth: Payer: Managed Care, Other (non HMO) | Admitting: Physician Assistant

## 2023-01-27 DIAGNOSIS — B353 Tinea pedis: Secondary | ICD-10-CM | POA: Diagnosis not present

## 2023-01-28 MED ORDER — FLUCONAZOLE 150 MG PO TABS: 150.0000 mg | ORAL_TABLET | ORAL | 0 refills | Status: DC

## 2023-01-28 NOTE — Progress Notes (Signed)
E-Visit for Athlete's Foot  We are sorry that you are not feeling well. Here is how we plan to help!  Based on what you shared with me it looks like you have tinea pedis, or "Athlete's Foot".  This type of rash can spread through shared towels, clothing, bedding, etc., as well as hard surfaces (particularly in moist areas) such as shower stalls, locker room floors, pool areas, etc. The symptoms of Athlete's Foot include red, swollen, peeling, itchy skin between the toes (especially between the pinky toe and the one next to it). The sole and heel of the foot may also be affected. In severe cases, the skin on the feet can blister.  Athlete's foot can usually be treated with over-the-counter topical antifungal products; but sometimes with chronic or extensive tinea pedis, prescription oral medications are needed.   I am recommending:Clotrimazole 1% cream or gel, apply to area twice per day   Prescription medications are only indicated for an extensive rash or if over the counter treatments have failed.  I am prescribing:Fluconazole 150 mg once weekly for two to four weeks  HOME CARE:  Keep feet clean, dry, and cool. Avoid using swimming pools, public showers, or foot baths. Wear sandals when possible or air shoes out by alternating them every 2-3 days. Avoid wearing closed shoes and wearing socks made from fabric that doesn't dry easily (for example, nylon). Treat the infection with recommended medication  GET HELP RIGHT AWAY IF:  Symptoms that don't go away after treatment. Severe itching that persists. If your rash spreads or swells. If your rash begins to have drainage or smell. You develop a fever.  MAKE SURE YOU   Understand these instructions. Will watch your condition. Will get help right away if you are not doing well or get worse.   Thank you for choosing an e-visit.  Your e-visit answers were reviewed by a board certified advanced clinical practitioner to  complete your personal care plan. Depending upon the condition, your plan could have included both over the counter or prescription medications.  Please review your pharmacy choice. Make sure the pharmacy is open so you can pick up prescription now. If there is a problem, you may contact your provider through MyChart messaging and have the prescription routed to another pharmacy.  Your safety is important to us. If you have drug allergies check your prescription carefully.   For the next 24 hours you can use MyChart to ask questions about today's visit, request a non-urgent call back, or ask for a work or school excuse.  You will get an email in the next two days asking about your experience. I hope that your e-visit has been valuable and will speed your recovery  References or for more information:  https://www.uptodate.com/contents/dermatophyte-tinea-infections?search=athletes%20foot%20treatment&source=search_result&selectedTitle=1~104&usage_type=default&display_rank=1  https://www.cdc.gov/healthywater/hygiene/disease/athletes_foot.html         

## 2023-01-28 NOTE — Progress Notes (Signed)
I have spent 5 minutes in review of e-visit questionnaire, review and updating patient chart, medical decision making and response to patient.   Mia Milan Cody Jacklynn Dehaas, PA-C    

## 2023-02-03 ENCOUNTER — Telehealth: Payer: Managed Care, Other (non HMO) | Admitting: Physician Assistant

## 2023-02-03 DIAGNOSIS — K649 Unspecified hemorrhoids: Secondary | ICD-10-CM | POA: Diagnosis not present

## 2023-02-03 MED ORDER — HYDROCORTISONE ACETATE 25 MG RE SUPP: 25.0000 mg | Freq: Two times a day (BID) | RECTAL | 0 refills | Status: DC

## 2023-02-03 NOTE — Progress Notes (Signed)

## 2023-02-17 ENCOUNTER — Telehealth: Payer: Managed Care, Other (non HMO) | Admitting: Nurse Practitioner

## 2023-02-17 DIAGNOSIS — R3989 Other symptoms and signs involving the genitourinary system: Secondary | ICD-10-CM | POA: Diagnosis not present

## 2023-02-17 MED ORDER — CEPHALEXIN 500 MG PO CAPS: 500.0000 mg | ORAL_CAPSULE | Freq: Two times a day (BID) | ORAL | 0 refills | Status: AC

## 2023-02-17 NOTE — Progress Notes (Signed)
 E-Visit for Urinary Problems  We are sorry that you are not feeling well.  Here is how we plan to help!  Based on what you shared with me it looks like you most likely have a simple urinary tract infection.  A UTI (Urinary Tract Infection) is a bacterial infection of the bladder.  Most cases of urinary tract infections are simple to treat but a key part of your care is to encourage you to drink plenty of fluids and watch your symptoms carefully.  I have prescribed Keflex 500 mg twice a day for 7 days.  Your symptoms should gradually improve. Call us if the burning in your urine worsens, you develop worsening fever, back pain or pelvic pain or if your symptoms do not resolve after completing the antibiotic.  Urinary tract infections can be prevented by drinking plenty of water to keep your body hydrated.  Also be sure when you wipe, wipe from front to back and don't hold it in!  If possible, empty your bladder every 4 hours.  HOME CARE Drink plenty of fluids Compete the full course of the antibiotics even if the symptoms resolve Remember, when you need to go.go. Holding in your urine can increase the likelihood of getting a UTI! GET HELP RIGHT AWAY IF: You cannot urinate You get a high fever Worsening back pain occurs You see blood in your urine You feel sick to your stomach or throw up You feel like you are going to pass out  MAKE SURE YOU  Understand these instructions. Will watch your condition. Will get help right away if you are not doing well or get worse.   Thank you for choosing an e-visit.  Your e-visit answers were reviewed by a board certified advanced clinical practitioner to complete your personal care plan. Depending upon the condition, your plan could have included both over the counter or prescription medications.  Please review your pharmacy choice. Make sure the pharmacy is open so you can pick up prescription now. If there is a problem, you may contact your  provider through [employer] and have the prescription routed to another pharmacy.  Your safety is important to [place]. If you have drug allergies check your prescription carefully.   For the next 24 hours you can use MyChart to ask questions about today's visit, request a non-urgent call back, or ask for a work or school excuse. You will get an email in the next two days asking about your experience. I hope that your e-visit has been valuable and will speed your recovery.   Meds ordered this encounter  Medications   cephALEXin (KEFLEX) 500 MG capsule    Sig: Take 1 capsule (500 mg total) by mouth 2 (two) times daily for 7 days.    Dispense:  14 capsule    Refill:  0    I spent approximately 5 minutes reviewing the patient's history, current symptoms and coordinating their care today.

## 2023-02-20 DIAGNOSIS — L0591 Pilonidal cyst without abscess: Secondary | ICD-10-CM

## 2023-02-21 ENCOUNTER — Telehealth: Payer: Managed Care, Other (non HMO) | Admitting: Physician Assistant

## 2023-02-21 ENCOUNTER — Telehealth: Payer: Managed Care, Other (non HMO)

## 2023-02-21 DIAGNOSIS — B356 Tinea cruris: Secondary | ICD-10-CM

## 2023-02-21 MED ORDER — CLOTRIMAZOLE-BETAMETHASONE 1-0.05 % EX CREA: 1.0000 | TOPICAL_CREAM | Freq: Every day | CUTANEOUS | 0 refills | Status: DC

## 2023-02-21 NOTE — Progress Notes (Signed)
 E-Visit for [employer]  We are sorry that you are not feeling well. Here is how we plan to help!  Based on what you shared with me it looks like you have tinea cruris, or "Jock Itch".  The symptoms of Jock Itch include red, peeling, itchy rash that affects the groin (crease where the leg meets the trunk).  This fungal infection can be spread through shared towels, clothing, bedding, or hard surfaces (particularly in moist areas) such as shower stalls, locker room floors, or pool area that has the fungus present. If you have a fungal infection on one part of your body, you can also spread it to other parts. For instance, men with a fungal infection on their feet sometimes spread it to their groin.  I am recommending:Clotrimazole  1% cream or gel, apply to area twice per day   HOME CARE:  Keep affected area clean, dry, and cool. Wash with soap and shampoo after sports or exercise and dry yourself well after bathing or swimming Wear cotton underwear and change them if they become damp or sweaty. Avoid using swimming pools, public showers, or baths.  GET HELP RIGHT AWAY IF:  Symptoms that don't away after treatment. Severe itching that persists. If your rash spreads or swells. If your rash begins to have drainage or smell. You develop a fever.  MAKE SURE YOU   Understand these instructions. Will watch your condition. Will get help right away if you are not doing well or get worse.  Thank you for choosing an e-visit.  Your e-visit answers were reviewed by a board certified advanced clinical practitioner to complete your personal care plan. Depending upon the condition, your plan could have included both over the counter or prescription medications.  Please review your pharmacy choice. Make sure the pharmacy is open so you can pick up prescription now. If there is a problem, you may contact your provider through [employer] and have the prescription routed to another pharmacy.  Your  safety is important to us . If you have drug allergies check your prescription carefully.   For the next 24 hours you can use MyChart to ask questions about today's visit, request a non-urgent call back, or ask for a work or school excuse. You will get an email in the next two days asking about your experience. I hope that your e-visit has been valuable and will speed your recovery.   References or for more information:  loyaltyus.is truckor.si.html birthroom.si?search=jock%20itch&source=search_result&selectedTitle=3~52&usage_type=default&display_rank=3   I have spent 5 minutes in review of e-visit questionnaire, review and updating patient chart, medical decision making and response to patient.   Harlene PEDLAR Ward, PA-C

## 2023-02-21 NOTE — Progress Notes (Signed)
 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   NOTE: There will be NO CHARGE for this E-Visit   If you are having a true medical emergency please call 911.     For an urgent face to face visit, [facility] has multiple urgent care centers for your convenience.   [facility] Your location to [facility] Urgent Care at [facility] [phone] [address] [place], [place] [address]   Urgent Care at [facility] [place], [place]  [place] Your location to [facility] Urgent Care at [facility] [phone] [address] [place],  [place]  [address]  [facility] Urgent Care at [facility]) Get Driving Directions [phone] [address] [place], [address]    [facility] Urgent Care Center at [facility] Get Driving Directions [phone] [address] [place], [place] [address]   [facility] Urgent [facility]) Get Driving Directions [phone] [address] [place], [place] [address]  [facility] Urgent Care Center [facility]) Get Driving Directions [phone] [address] [place],  [place]  [address]  [facility] Urgent Care Center [facility] - at [employer]  [phone] [address] [place],  [place] [address]   [facility] Urgent Care at [facility] Get Driving Directions [phone] [address] [place], [place] [address]   [facility] Urgent Care at [facility] Get Driving Directions  [phone] [address] [place], [place] [address]   [facility] Urgent Care at  [facility] Directions [phone] [address] [place], [place] [address]  Your MyChart E-visit questionnaire answers were reviewed by a board certified advanced clinical practitioner to complete your personal care plan based on your specific symptoms.  Thank you for using e-Visits.

## 2023-02-21 NOTE — Progress Notes (Signed)
 I have spent 5 minutes in review of e-visit questionnaire, review and updating patient chart, medical decision making and response to patient.   Claiborne Rigg, NP

## 2023-03-01 ENCOUNTER — Telehealth: Payer: Managed Care, Other (non HMO) | Admitting: Physician Assistant

## 2023-03-01 DIAGNOSIS — L0501 Pilonidal cyst with abscess: Secondary | ICD-10-CM | POA: Diagnosis not present

## 2023-03-01 MED ORDER — IBUPROFEN 800 MG PO TABS
800.0000 mg | ORAL_TABLET | Freq: Three times a day (TID) | ORAL | 0 refills | Status: DC | PRN
Start: 2023-03-01 — End: 2023-05-01

## 2023-03-01 MED ORDER — DOXYCYCLINE HYCLATE 100 MG PO CAPS: 100.0000 mg | ORAL_CAPSULE | Freq: Two times a day (BID) | ORAL | 0 refills | Status: DC

## 2023-03-01 NOTE — Progress Notes (Signed)
Virtual Visit Consent   Jeffrey Chang, you are scheduled for a virtual visit with a [place] provider today. Just as with appointments in the office, your consent must be obtained to participate. Your consent will be active for this visit and any virtual visit you may have with one of our providers in the next 365 days. If you have a MyChart account, a copy of this consent can be sent to you electronically.  As this is a virtual visit, video technology does not allow for your provider to perform a traditional examination. This may limit your provider's ability to fully assess your condition. If your provider identifies any concerns that need to be evaluated in person or the need to arrange testing (such as labs, EKG, etc.), we will make arrangements to do so. Although advances in technology are sophisticated, we cannot ensure that it will always work on either your end or our end. If the connection with a video visit is poor, the visit may have to be switched to a telephone visit. With either a video or telephone visit, we are not always able to ensure that we have a secure connection.  By engaging in this virtual visit, you consent to the provision of healthcare and authorize for your insurance to be billed (if applicable) for the services provided during this visit. Depending on your insurance coverage, you may receive a charge related to this service.  I need to obtain your verbal consent now. Are you willing to proceed with your visit today? Jeffrey Chang has provided verbal consent on 03/01/2023 for a virtual visit (video or telephone). Jeffrey Jaffe, PA-C  Date: 03/01/2023 2:14 PM  Virtual Visit via Video Note   I, Jeffrey Chang, connected with  Jeffrey Chang  ([id number], 11-30-1995) on 03/01/23 at  2:00 PM EST by a video-enabled telemedicine application and verified that I am speaking with the correct person using two identifiers.  Location: Patient: Virtual Visit Location Patient:  Mobile Provider: Virtual Visit Location Provider: Home Office   I discussed the limitations of evaluation and management by telemedicine and the availability of in person appointments. The patient expressed understanding and agreed to proceed.    History of Present Illness: Jeffrey Chang is a 28 y.o. who identifies as a male who was assigned male at birth, presents with a recurrent cyst on the right buttock, similar to a previous episode. The onset of the current episode was approximately two weeks ago.  The cyst has been causing significant discomfort, particularly when standing or sleeping, but there has been no discharge.    They have been taking ibuprofen for the pain with modest relief Problems:  Patient Active Problem List   Diagnosis Date Noted   Cannabis abuse with psychotic disorder, with delusions (HCC) 07/07/2016    Allergies:  Allergies  Allergen Reactions   Latex    Medications:  Current Outpatient Medications:    doxycycline (VIBRAMYCIN) 100 MG capsule, Take 1 capsule (100 mg total) by mouth 2 (two) times daily., Disp: 20 capsule, Rfl: 0   ibuprofen (ADVIL) 800 MG tablet, Take 1 tablet (800 mg total) by mouth every 8 (eight) hours as needed., Disp: 30 tablet, Rfl: 0   albuterol (VENTOLIN HFA) 108 (90 Base) MCG/ACT inhaler, Inhale 1-2 puffs into the lungs every 6 (six) hours as needed for wheezing or shortness of breath., Disp: 1 each, Rfl: 0   bacitracin-polymyxin b (POLYSPORIN) ophthalmic ointment, Place 1 Application into the right eye 3 (three) times daily.,  Disp: 3.5 g, Rfl: 0   clotrimazole-betamethasone (LOTRISONE) cream, Apply 1 Application topically daily., Disp: 30 g, Rfl: 0   fluconazole (DIFLUCAN) 150 MG tablet, Take 1 tablet (150 mg total) by mouth once a week., Disp: 2 tablet, Rfl: 0   fluticasone (FLONASE) 50 MCG/ACT nasal spray, Place 2 sprays into both nostrils daily., Disp: 16 g, Rfl: 0   hydrocortisone (ANUSOL-HC) 25 MG suppository, Place 1 suppository  (25 mg total) rectally 2 (two) times daily., Disp: 12 suppository, Rfl: 0   ipratropium (ATROVENT) 0.03 % nasal spray, Place 2 sprays into both nostrils every 12 (twelve) hours., Disp: 30 mL, Rfl: 12   tiZANidine (ZANAFLEX) 4 MG tablet, Take 1 tablet (4 mg total) by mouth every 8 (eight) hours as needed for muscle spasms. Do not take with alcohol or while driving or operating heavy machinery.  May cause drowsiness., Disp: 30 tablet, Rfl: 0  Observations/Objective: Patient is well-developed, well-nourished in no acute distress.  Resting comfortably  at home.  Head is normocephalic, atraumatic.  No labored breathing.  Speech is clear and coherent with logical content.  Patient is alert and oriented at baseline.    Assessment and Plan: 1. Pilonidal cyst with abscess (Primary) - doxycycline (VIBRAMYCIN) 100 MG capsule; Take 1 capsule (100 mg total) by mouth 2 (two) times daily.  Dispense: 20 capsule; Refill: 0 - ibuprofen (ADVIL) 800 MG tablet; Take 1 tablet (800 mg total) by mouth every 8 (eight) hours as needed.  Dispense: 30 tablet; Refill: 0  Painful and causing sleep disturbance. No discharge. Previous relief with Doxycycline. -Start Doxycycline as previously prescribed. - Ibuprofen for pain relief. -Advise patient to schedule an appointment with a primary care provider for further evaluation and possible referral to general surgery for definitive treatment.  Follow Up Instructions: I discussed the assessment and treatment plan with the patient. The patient was provided an opportunity to ask questions and all were answered. The patient agreed with the plan and demonstrated an understanding of the instructions.  A copy of instructions were sent to the patient via MyChart unless otherwise noted below.     The patient was advised to call back or seek an in-person evaluation if the symptoms worsen or if the condition fails to improve as anticipated.    Kasandra Knudsen Mayers, PA-C

## 2023-03-01 NOTE — Patient Instructions (Signed)
[name], thank you for joining [name], PA-C for today's virtual visit.  While this provider is not your primary care provider (PCP), if your PCP is located in our provider database this encounter information will be shared with them immediately following your visit.   A [facility] MyChart account gives you access to today's visit and all your visits, tests, and labs performed at [facility] " click here if you don't have a [facility] MyChart account or go to [email or web address]  Consent: (Patient) Jeffrey Chang provided verbal consent for this virtual visit at the beginning of the encounter.  Current Medications:  Current Outpatient Medications:    doxycycline (VIBRAMYCIN) 100 MG capsule, Take 1 capsule (100 mg total) by mouth 2 (two) times daily., Disp: 20 capsule, Rfl: 0   ibuprofen (ADVIL) 800 MG tablet, Take 1 tablet (800 mg total) by mouth every 8 (eight) hours as needed., Disp: 30 tablet, Rfl: 0   albuterol (VENTOLIN HFA) 108 (90 Base) MCG/ACT inhaler, Inhale 1-2 puffs into the lungs every 6 (six) hours as needed for wheezing or shortness of breath., Disp: 1 each, Rfl: 0   bacitracin-polymyxin b (POLYSPORIN) ophthalmic ointment, Place 1 Application into the right eye 3 (three) times daily., Disp: 3.5 g, Rfl: 0   clotrimazole-betamethasone (LOTRISONE) cream, Apply 1 Application topically daily., Disp: 30 g, Rfl: 0   fluconazole (DIFLUCAN) 150 MG tablet, Take 1 tablet (150 mg total) by mouth once a week., Disp: 2 tablet, Rfl: 0   fluticasone (FLONASE) 50 MCG/ACT nasal spray, Place 2 sprays into both nostrils daily., Disp: 16 g, Rfl: 0   hydrocortisone (ANUSOL-HC) 25 MG suppository, Place 1 suppository (25 mg total) rectally 2 (two) times daily., Disp: 12 suppository, Rfl: 0   ipratropium (ATROVENT) 0.03 % nasal spray, Place 2 sprays into both nostrils every 12 (twelve) hours., Disp: 30 mL, Rfl: 12   tiZANidine (ZANAFLEX) 4 MG tablet, Take 1 tablet (4 mg  total) by mouth every 8 (eight) hours as needed for muscle spasms. Do not take with alcohol or while driving or operating heavy machinery.  May cause drowsiness., Disp: 30 tablet, Rfl: 0   Medications ordered in this encounter:  Meds ordered this encounter  Medications   doxycycline (VIBRAMYCIN) 100 MG capsule    Sig: Take 1 capsule (100 mg total) by mouth 2 (two) times daily.    Dispense:  20 capsule    Refill:  0    Supervising Provider:   Merrilee Jansky [[id number]]   ibuprofen (ADVIL) 800 MG tablet    Sig: Take 1 tablet (800 mg total) by mouth every 8 (eight) hours as needed.    Dispense:  30 tablet    Refill:  0    Supervising Provider:   Merrilee Jansky [[id number]]     *If you need refills on other medications prior to your next appointment, please contact your pharmacy*  Follow-Up: Call back or seek an in-person evaluation if the symptoms worsen or if the condition fails to improve as anticipated.  [facility] [phone]  Other Instructions Pilonidal Cyst  A pilonidal cyst is a fluid-filled sac that forms under the skin near the tailbone, at the top of the crease of the buttocks (pilonidal area). Cysts that are small and not infected may not cause any problems. Cysts that become irritated or infected may grow and fill with pus. An infected cyst is called an abscess. A pilonidal abscess may cause pain and swelling. It may  need to be drained or removed. What are the causes? The cause of this condition is not always known. In some cases, it may be caused by a hair that grows into your skin (ingrown hair). What increases the risk? You are more likely to develop this condition if: You are male. You have lots of hair near the crease of the buttocks. You are overweight. You have a dimple near the crease of the buttocks. You wear tight clothing. You do not bathe or shower often. You sit for long periods of time. What are the signs or symptoms? Symptoms of this  condition may include pain, swelling, redness, and warmth in the pilonidal area. You may also be able to feel a lump near your tailbone if your cyst is big. If your cyst becomes infected, symptoms may include: Pus or fluid drainage. Fever. Pain, swelling, and redness getting worse. The lump getting bigger. How is this diagnosed? This condition may be diagnosed based on: Your symptoms and medical history. A physical exam. A blood test to check for infection. A test of a pus sample. How is this treated? You may not need any treatment if your cyst does not cause symptoms. If your cyst bothers you or is infected, you may need a procedure to drain or remove the cyst. Depending on the size, location, and severity of your cyst, your health care provider may: Make an incision in the cyst and drain it (incision anddrainage). Open and drain the cyst, and then stitch the wound so that it stays open while it heals (marsupialization). You will be given instructions about how to care for your open wound while it heals. Remove all or part of the cyst, and then close the wound (cyst removal). You may need to take antibiotics before your procedure. Follow these instructions at home: Medicines Take over-the-counter and prescription medicines only as told by your health care provider. If you were prescribed antibiotics, take them as told by your health care provider. Do not stop taking the antibiotic even if you start to feel better. General instructions Keep the area around the cyst clean and dry. If there is fluid or pus draining from your cyst: Cover the area with a clean bandage (dressing). Wash the area gently with soap and water. Pat the area dry with a clean towel. Do not rub the area because that may cause bleeding. Remove hair from the area around the cyst only if your health care provider tells you to. Do not wear tight pants or sit in one position for long periods at a time. Contact a health  care provider if: You have new redness, swelling, or pain. You have a fever. You have severe pain. Summary A pilonidal cyst is a fluid-filled sac that forms under the skin near the tailbone, at the top of the crease of the buttocks (pilonidal area). Cysts that become irritated or infected may grow and fill with pus. An infected cyst is called an abscess. The cause of this condition is not always known. In some cases, it may be caused by a hair that grows into your skin (ingrown hair). You may not need any treatment if your cyst does not cause symptoms. If your cyst bothers you or is infected, you may need a procedure to drain or remove the cyst. This information is not intended to replace advice given to you by your health care provider. Make sure you discuss any questions you have with your health care provider. Document Revised: 04/24/2021  Document Reviewed: 04/24/2021 Elsevier Patient Education  2024 Elsevier Inc.   If you have been instructed to have an in-person evaluation today at a local Urgent Care facility, please use the link below. It will take you to a list of all of our available [place] [facility], including address, phone number and hours of operation. Please do not delay care.  [facility]  If you or a family member do not have a primary care provider, use the link below to schedule a visit and establish care. When you choose a [facility] primary care physician or advanced practice provider, you gain a long-term partner in health. Find a Primary Care Provider  Learn more about [facility]'s in-office and virtual care options: [facility] - Get Care Now

## 2023-03-05 ENCOUNTER — Telehealth: Payer: Managed Care, Other (non HMO) | Admitting: Physician Assistant

## 2023-03-05 DIAGNOSIS — R109 Unspecified abdominal pain: Secondary | ICD-10-CM

## 2023-03-05 DIAGNOSIS — K5900 Constipation, unspecified: Secondary | ICD-10-CM | POA: Diagnosis not present

## 2023-03-05 MED ORDER — DICYCLOMINE HCL 10 MG PO CAPS: 10.0000 mg | ORAL_CAPSULE | Freq: Three times a day (TID) | ORAL | 0 refills | Status: DC

## 2023-03-05 NOTE — Patient Instructions (Signed)
[name], thank you for joining [name], PA-C for today's virtual visit.  While this provider is not your primary care provider (PCP), if your PCP is located in our provider database this encounter information will be shared with them immediately following your visit.   A [facility] MyChart account gives you access to today's visit and all your visits, tests, and labs performed at [facility] " click here if you don't have a [facility] MyChart account or go to [email or web address]  Consent: (Patient) Dekari Koetting provided verbal consent for this virtual visit at the beginning of the encounter.  Current Medications:  Current Outpatient Medications:    albuterol (VENTOLIN HFA) 108 (90 Base) MCG/ACT inhaler, Inhale 1-2 puffs into the lungs every 6 (six) hours as needed for wheezing or shortness of breath., Disp: 1 each, Rfl: 0   bacitracin-polymyxin b (POLYSPORIN) ophthalmic ointment, Place 1 Application into the right eye 3 (three) times daily., Disp: 3.5 g, Rfl: 0   clotrimazole-betamethasone (LOTRISONE) cream, Apply 1 Application topically daily., Disp: 30 g, Rfl: 0   doxycycline (VIBRAMYCIN) 100 MG capsule, Take 1 capsule (100 mg total) by mouth 2 (two) times daily., Disp: 20 capsule, Rfl: 0   fluconazole (DIFLUCAN) 150 MG tablet, Take 1 tablet (150 mg total) by mouth once a week., Disp: 2 tablet, Rfl: 0   fluticasone (FLONASE) 50 MCG/ACT nasal spray, Place 2 sprays into both nostrils daily., Disp: 16 g, Rfl: 0   hydrocortisone (ANUSOL-HC) 25 MG suppository, Place 1 suppository (25 mg total) rectally 2 (two) times daily., Disp: 12 suppository, Rfl: 0   ibuprofen (ADVIL) 800 MG tablet, Take 1 tablet (800 mg total) by mouth every 8 (eight) hours as needed., Disp: 30 tablet, Rfl: 0   ipratropium (ATROVENT) 0.03 % nasal spray, Place 2 sprays into both nostrils every 12 (twelve) hours., Disp: 30 mL, Rfl: 12   tiZANidine (ZANAFLEX) 4 MG tablet, Take 1 tablet  (4 mg total) by mouth every 8 (eight) hours as needed for muscle spasms. Do not take with alcohol or while driving or operating heavy machinery.  May cause drowsiness., Disp: 30 tablet, Rfl: 0   Medications ordered in this encounter:  No orders of the defined types were placed in this encounter.    *If you need refills on other medications prior to your next appointment, please contact your pharmacy*  Follow-Up: Call back or seek an in-person evaluation if the symptoms worsen or if the condition fails to improve as anticipated.  [facility] [phone]  Other Instructions Please stay hydrated and rest. Make sure you are using plain Simethicone OTC and not any products containing loperamide as well as it will constipate you more.   Start a daily probiotic (Align, Culturelle, Digestive Advantage, etc.). Take 2 Tbs of Milk of Magnesia in a 4 oz glass of warmed prune juice every 2-3 days to help promote bowel movement. If no results within 24 hours, then repeat above regimen, adding a Dulcolax stool softener to regimen.   The dicyclomine is to help with cramping.  As discussed I want you to be seen in person within the next few days since you cannot tonight. If anything acutely worsens, please seek an ER evaluation ASAP.    If you have been instructed to have an in-person evaluation today at a local Urgent Care facility, please use the link below. It will take you to a list of all of our available [place]  [facility], including address, phone  number and hours of operation. Please do not delay care.  [facility]  If you or a family member do not have a primary care provider, use the link below to schedule a visit and establish care. When you choose a [place] primary care physician or advanced practice provider, you gain a long-term partner in health. Find a Primary Care Provider  Learn more about 's in-office and virtual care options: [facility] - Get Care Now

## 2023-03-05 NOTE — Progress Notes (Signed)
Virtual Visit Consent   Jeffrey Chang, you are scheduled for a virtual visit with a [facility] provider today. Just as with appointments in the office, your consent must be obtained to participate. Your consent will be active for this visit and any virtual visit you may have with one of our providers in the next 365 days. If you have a MyChart account, a copy of this consent can be sent to you electronically.  As this is a virtual visit, video technology does not allow for your provider to perform a traditional examination. This may limit your provider's ability to fully assess your condition. If your provider identifies any concerns that need to be evaluated in person or the need to arrange testing (such as labs, EKG, etc.), we will make arrangements to do so. Although advances in technology are sophisticated, we cannot ensure that it will always work on either your end or our end. If the connection with a video visit is poor, the visit may have to be switched to a telephone visit. With either a video or telephone visit, we are not always able to ensure that we have a secure connection.  By engaging in this virtual visit, you consent to the provision of healthcare and authorize for your insurance to be billed (if applicable) for the services provided during this visit. Depending on your insurance coverage, you may receive a charge related to this service.  I need to obtain your verbal consent now. Are you willing to proceed with your visit today? Jeffrey Chang has provided verbal consent on 03/05/2023 for a virtual visit (video or telephone). Jeffrey Chang, [place]  Date: 03/05/2023 5:15 PM  Virtual Visit via Video Note   I, Jeffrey Chang, connected with  Jeffrey Chang  ([id number], 1995/09/04) on 03/05/23 at  5:00 PM EST by a video-enabled telemedicine application and verified that I am speaking with the correct person using two identifiers.  Location: Patient: Virtual Visit Location  Patient: Home Provider: Virtual Visit Location Provider: Home Office   I discussed the limitations of evaluation and management by telemedicine and the availability of in person appointments. The patient expressed understanding and agreed to proceed.    History of Present Illness: Jeffrey Chang is a 28 y.o. who identifies as a male who was assigned male at birth, and is being seen today for sensation of trapped gas first noted over the past 2 months. Denies trauma or injury. Notes pain is sometimes in the abdomen but usually over the flanks bilaterally. Gas X periodically over the past few weeks. Is helpful acutely. Cannot pass much gas although he feels he needs to. Notes some overt constipation for the past several weeks. Last BM today and harder to pass. Denies blood in the stool.  Denies vomiting, fever, chills. Some abdominal cramping. Tried to call a GI provider but would not see him without a referral. Denies having a PCP currently.  HPI: HPI  Problems:  Patient Active Problem List   Diagnosis Date Noted   Cannabis abuse with psychotic disorder, with delusions (HCC) 07/07/2016    Allergies:  Allergies  Allergen Reactions   Latex    Medications:  Current Outpatient Medications:    dicyclomine (BENTYL) 10 MG capsule, Take 1 capsule (10 mg total) by mouth 3 (three) times daily before meals., Disp: 15 capsule, Rfl: 0   albuterol (VENTOLIN HFA) 108 (90 Base) MCG/ACT inhaler, Inhale 1-2 puffs into the lungs every 6 (six) hours as needed for wheezing or shortness  of breath., Disp: 1 each, Rfl: 0   bacitracin-polymyxin b (POLYSPORIN) ophthalmic ointment, Place 1 Application into the right eye 3 (three) times daily., Disp: 3.5 g, Rfl: 0   clotrimazole-betamethasone (LOTRISONE) cream, Apply 1 Application topically daily., Disp: 30 g, Rfl: 0   doxycycline (VIBRAMYCIN) 100 MG capsule, Take 1 capsule (100 mg total) by mouth 2 (two) times daily., Disp: 20 capsule, Rfl: 0   fluconazole (DIFLUCAN)  150 MG tablet, Take 1 tablet (150 mg total) by mouth once a week., Disp: 2 tablet, Rfl: 0   fluticasone (FLONASE) 50 MCG/ACT nasal spray, Place 2 sprays into both nostrils daily., Disp: 16 g, Rfl: 0   hydrocortisone (ANUSOL-HC) 25 MG suppository, Place 1 suppository (25 mg total) rectally 2 (two) times daily., Disp: 12 suppository, Rfl: 0   ibuprofen (ADVIL) 800 MG tablet, Take 1 tablet (800 mg total) by mouth every 8 (eight) hours as needed., Disp: 30 tablet, Rfl: 0   ipratropium (ATROVENT) 0.03 % nasal spray, Place 2 sprays into both nostrils every 12 (twelve) hours., Disp: 30 mL, Rfl: 12   tiZANidine (ZANAFLEX) 4 MG tablet, Take 1 tablet (4 mg total) by mouth every 8 (eight) hours as needed for muscle spasms. Do not take with alcohol or while driving or operating heavy machinery.  May cause drowsiness., Disp: 30 tablet, Rfl: 0  Observations/Objective: Patient is well-developed, well-nourished in no acute distress.  Resting comfortably at home.  Head is normocephalic, atraumatic.  No labored breathing. Speech is clear and coherent with logical content.  Patient is alert and oriented at baseline.   Assessment and Plan: 1. Constipation, unspecified constipation type (Primary)  2. Abdominal cramping - dicyclomine (BENTYL) 10 MG capsule; Take 1 capsule (10 mg total) by mouth 3 (three) times daily before meals.  Dispense: 15 capsule; Refill: 0  Issue over past couple of months now with worsening gas/bloating. Discussed want him to have an in person evaluation. He is without PCP and is responsible for his daughter so notes he would not be able to be seen in person today. Will start him on a bowel regimen and plain simethicone. Dicyclomine per orders. He is to be seen this week at one of our in person UC locations. ER precautions reviewed.   Follow Up Instructions: I discussed the assessment and treatment plan with the patient. The patient was provided an opportunity to ask questions and all were  answered. The patient agreed with the plan and demonstrated an understanding of the instructions.  A copy of instructions were sent to the patient via MyChart unless otherwise noted below.   The patient was advised to call back or seek an in-person evaluation if the symptoms worsen or if the condition fails to improve as anticipated.    Jeffrey Climes, PA-C

## 2023-03-12 ENCOUNTER — Telehealth: Payer: Managed Care, Other (non HMO) | Admitting: Nurse Practitioner

## 2023-03-12 DIAGNOSIS — K649 Unspecified hemorrhoids: Secondary | ICD-10-CM

## 2023-03-12 MED ORDER — HYDROCORTISONE 2.5 % EX OINT: TOPICAL_OINTMENT | Freq: Two times a day (BID) | CUTANEOUS | 0 refills | Status: DC

## 2023-03-12 NOTE — Progress Notes (Signed)

## 2023-03-20 ENCOUNTER — Ambulatory Visit
Admission: RE | Admit: 2023-03-20 | Discharge: 2023-03-20 | Disposition: A | Discharge status: Home / Self Care | Payer: Managed Care, Other (non HMO) | Source: Ambulatory Visit | Attending: Physician Assistant

## 2023-03-20 VITALS — BP 125/85 | HR 80 | Temp 98.5°F | Resp 17

## 2023-03-20 DIAGNOSIS — S161XXA Strain of muscle, fascia and tendon at neck level, initial encounter: Secondary | ICD-10-CM

## 2023-03-20 DIAGNOSIS — S39012A Strain of muscle, fascia and tendon of lower back, initial encounter: Secondary | ICD-10-CM | POA: Diagnosis not present

## 2023-03-20 MED ORDER — CYCLOBENZAPRINE HCL 5 MG PO TABS: 5.0000 mg | ORAL_TABLET | Freq: Every evening | ORAL | 0 refills | Status: DC | PRN

## 2023-03-20 MED ORDER — PREDNISONE 20 MG PO TABS: 40.0000 mg | ORAL_TABLET | Freq: Every day | ORAL | 0 refills | Status: DC

## 2023-03-20 NOTE — ED Triage Notes (Signed)
 Pt presents with c/o possible pinched nerve. Pt has pain from the lower back to the neck X 2 wks. Pthas taken Ibuprofen  and Tylenol 

## 2023-03-20 NOTE — Discharge Instructions (Signed)
 Apply ice and heat to neck and back 15 minutes 4 times per day  Take medication as prescribed  Stretch neck and back daily  Do not take cyclobenzaprine  with alcohol or while driving as it can make you sleepy

## 2023-03-20 NOTE — ED Provider Notes (Addendum)
 [facility]    CSN: [id number] Arrival date & time: 03/20/23  0908      History   Chief Complaint Chief Complaint  Patient presents with   Appointment    HPI Jeffrey Chang is a 28 y.o. male.   Patient here c/w neck anc back pain x 2 weeks. Taking 800 mg ibuprofen  and 1000 mg tylenol  w/o relief.  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  No changes in bowel/bladder, no saddle anesthesia.  He reports he feels numbness in b/l fingers.    Past Medical History:  Diagnosis Date   Bipolar 1 disorder (HCC)    Manic depressive disorder [facility])     Patient Active Problem List   Diagnosis Date Noted   Cannabis abuse with psychotic disorder, with delusions (HCC) 07/07/2016    History reviewed. No pertinent surgical history.     Home Medications    Prior to Admission medications   Medication Sig Start Date End Date Taking? Authorizing Provider  cyclobenzaprine  (FLEXERIL ) 5 MG tablet Take 1 tablet (5 mg total) by mouth at bedtime as needed for muscle spasms. 03/20/23  Yes Juleen Rush, PA-C  predniSONE  (DELTASONE ) 20 MG tablet Take 2 tablets (40 mg total) by mouth daily with breakfast. 03/20/23  Yes Juleen Rush, PA-C  albuterol  (VENTOLIN  HFA) 108 (90 Base) MCG/ACT inhaler Inhale 1-2 puffs into the lungs every 6 (six) hours as needed for wheezing or shortness of breath. 12/26/22   Mayer, Jodi R, NP  bacitracin -polymyxin b  (POLYSPORIN ) ophthalmic ointment Place 1 Application into the right eye 3 (three) times daily. 01/11/23   Lavell Bari LABOR, FNP  clotrimazole -betamethasone  (LOTRISONE ) cream Apply 1 Application topically daily. 02/21/23   Ward, Harlene PEDLAR, PA-C  dicyclomine  (BENTYL ) 10 MG capsule Take 1 capsule (10 mg total) by mouth 3 (three) times daily before meals. 03/05/23   Gladis Elsie BROCKS, PA-C  doxycycline  (VIBRAMYCIN ) 100 MG capsule Take 1 capsule (100 mg total) by mouth 2 (two) times daily. 03/01/23   Mayers, Cari S, PA-C  fluconazole  (DIFLUCAN )  150 MG tablet Take 1 tablet (150 mg total) by mouth once a week. 01/28/23   Gladis Elsie BROCKS, PA-C  fluticasone  (FLONASE ) 50 MCG/ACT nasal spray Place 2 sprays into both nostrils daily. 11/17/22   Vivienne Delon HERO, PA-C  hydrocortisone  (ANUSOL -HC) 25 MG suppository Place 1 suppository (25 mg total) rectally 2 (two) times daily. 02/03/23   Ward, Harlene PEDLAR, PA-C  hydrocortisone  2.5 % ointment Apply topically 2 (two) times daily. 03/12/23   Kennyth Domino, FNP  ibuprofen  (ADVIL ) 800 MG tablet Take 1 tablet (800 mg total) by mouth every 8 (eight) hours as needed. 03/01/23   Mayers, Cari S, PA-C  ipratropium (ATROVENT ) 0.03 % nasal spray Place 2 sprays into both nostrils every 12 (twelve) hours. 09/23/22   Kennyth Domino, FNP    Family History History reviewed. No pertinent family history.  Social History Social History   Tobacco Use   Smoking status: Every Day    Types: Cigars   Smokeless tobacco: Never  Vaping Use   Vaping status: Never Used  Substance Use Topics   Alcohol use: Yes    Comment: Occassionally.   Drug use: Not Currently     Allergies   Latex   Review of Systems Review of Systems  Constitutional:  Negative for chills, fatigue and fever.  Gastrointestinal:  Negative for abdominal pain, constipation, diarrhea, nausea and vomiting.  Genitourinary:  Negative for dysuria, hematuria and urgency.  Musculoskeletal:  Positive for back pain and myalgias. Negative for arthralgias, gait problem and joint swelling.  Skin:  Negative for color change and rash.  Neurological:  Positive for numbness. Negative for seizures, syncope and weakness.  All other systems reviewed and are negative.    Physical Exam Triage Vital Signs ED Triage Vitals  Encounter Vitals Group     BP 03/20/23 0945 125/85     Systolic BP Percentile --      Diastolic BP Percentile --      Pulse Rate 03/20/23 0945 80     Resp 03/20/23 0945 17     Temp 03/20/23 0945 98.5 F (36.9 C)     Temp Source  03/20/23 0945 Oral     SpO2 03/20/23 0945 98 %     Weight --      Height --      Head Circumference --      Peak Flow --      Pain Score 03/20/23 0944 6     Pain Loc --      Pain Education --      Exclude from Growth Chart --    No data found.  Updated Vital Signs BP 125/85 (BP Location: Left Arm)   Pulse 80   Temp 98.5 F (36.9 C) (Oral)   Resp 17   SpO2 98%   Visual Acuity Right Eye Distance:   Left Eye Distance:   Bilateral Distance:    Right Eye Near:   Left Eye Near:    Bilateral Near:     Physical Exam Vitals and nursing note reviewed.  Constitutional:      Appearance: Normal appearance. He is well-developed.  HENT:     Head: Normocephalic and atraumatic.     Nose: Nose normal.     Mouth/Throat:     Mouth: Mucous membranes are moist.  Eyes:     General: No scleral icterus.    Extraocular Movements: Extraocular movements intact.     Conjunctiva/sclera: Conjunctivae normal.  Pulmonary:     Effort: Pulmonary effort is normal. No respiratory distress.  Musculoskeletal:     Cervical back: Normal range of motion and neck supple. Tenderness present. No rigidity or bony tenderness. No pain with movement. Normal range of motion.     Thoracic back: Tenderness present. No spasms or bony tenderness. Normal range of motion.     Lumbar back: Spasms and tenderness present. No bony tenderness. Normal range of motion.       Back:     Comments: Paraspinal tenderness along entire spinal column  Skin:    General: Skin is warm and dry.  Neurological:     General: No focal deficit present.     Mental Status: He is alert and oriented to person, place, and time.     Motor: No weakness, tremor or abnormal muscle tone.     Gait: Gait normal.     Deep Tendon Reflexes:     Reflex Scores:      Patellar reflexes are 2+ on the right side and 2+ on the left side. Psychiatric:        Mood and Affect: Mood normal.      UC Treatments / Results  Labs (all labs ordered are  listed, but only abnormal results are displayed) Labs Reviewed - No data to display  EKG   Radiology No results found.  Procedures Procedures (including critical care time)  Medications Ordered in UC Medications - No data to display  Initial Impression / Assessment and Plan / UC Course  I have reviewed the triage vital signs and the nursing notes.  Pertinent labs & imaging results that were available during my care of the patient were reviewed by me and considered in my medical decision making (see chart for details).     No red flag symptoms No changes in bowel/bladder No saddle anethesia Take medication, stretch back, use ice/heat Final Clinical Impressions(s) / UC Diagnoses   Final diagnoses:  Strain of neck muscle, initial encounter  Strain of lumbar region, initial encounter     Discharge Instructions      Apply ice and heat to neck and back 15 minutes 4 times per day  Take medication as prescribed  Stretch neck and back daily  Do not take cyclobenzaprine  with alcohol or while driving as it can make you sleepy   ED Prescriptions     Medication Sig Dispense Auth. Provider   predniSONE  (DELTASONE ) 20 MG tablet Take 2 tablets (40 mg total) by mouth daily with breakfast. 10 tablet Juleen Rush, PA-C   cyclobenzaprine  (FLEXERIL ) 5 MG tablet Take 1 tablet (5 mg total) by mouth at bedtime as needed for muscle spasms. 10 tablet Juleen Rush, PA-C      PDMP not reviewed this encounter.   Juleen Rush, PA-C 03/20/23 1108    Juleen Rush, PA-C 03/20/23 1109

## 2023-03-24 ENCOUNTER — Telehealth: Payer: Managed Care, Other (non HMO) | Admitting: Physician Assistant

## 2023-03-24 DIAGNOSIS — H9203 Otalgia, bilateral: Secondary | ICD-10-CM | POA: Diagnosis not present

## 2023-03-24 MED ORDER — AMOXICILLIN-POT CLAVULANATE 875-125 MG PO TABS: 1.0000 | ORAL_TABLET | Freq: Two times a day (BID) | ORAL | 0 refills | Status: DC

## 2023-03-24 NOTE — Progress Notes (Signed)

## 2023-03-25 ENCOUNTER — Telehealth: Payer: Managed Care, Other (non HMO) | Admitting: Physician Assistant

## 2023-03-25 DIAGNOSIS — H6506 Acute serous otitis media, recurrent, bilateral: Secondary | ICD-10-CM

## 2023-03-25 MED ORDER — NEOMYCIN-POLYMYXIN-HC 3.5-10000-1 OT SUSP: 3.0000 [drp] | Freq: Four times a day (QID) | OTIC | 0 refills | Status: DC

## 2023-03-25 NOTE — Patient Instructions (Signed)
 [name], thank you for joining Margaretann Loveless, PA-C for today's virtual visit.  While this provider is not your primary care provider (PCP), if your PCP is located in our provider database this encounter information will be shared with them immediately following your visit.   A [facility] MyChart account gives you access to today's visit and all your visits, tests, and labs performed at [facility] " click here if you don't have a [facility] MyChart account or go to [email or web address]  Consent: (Patient) Jeffrey Chang provided verbal consent for this virtual visit at the beginning of the encounter.  Current Medications:  Current Outpatient Medications:    neomycin-polymyxin-hydrocortisone (CORTISPORIN) 3.5-10000-1 OTIC suspension, Place 3 drops into both ears 4 (four) times daily. For 7 days, Disp: 10 mL, Rfl: 0   albuterol (VENTOLIN HFA) 108 (90 Base) MCG/ACT inhaler, Inhale 1-2 puffs into the lungs every 6 (six) hours as needed for wheezing or shortness of breath., Disp: 1 each, Rfl: 0   amoxicillin-clavulanate (AUGMENTIN) 875-125 MG tablet, Take 1 tablet by mouth 2 (two) times daily., Disp: 20 tablet, Rfl: 0   clotrimazole-betamethasone (LOTRISONE) cream, Apply 1 Application topically daily., Disp: 30 g, Rfl: 0   cyclobenzaprine (FLEXERIL) 5 MG tablet, Take 1 tablet (5 mg total) by mouth at bedtime as needed for muscle spasms., Disp: 10 tablet, Rfl: 0   dicyclomine (BENTYL) 10 MG capsule, Take 1 capsule (10 mg total) by mouth 3 (three) times daily before meals., Disp: 15 capsule, Rfl: 0   fluconazole (DIFLUCAN) 150 MG tablet, Take 1 tablet (150 mg total) by mouth once a week., Disp: 2 tablet, Rfl: 0   fluticasone (FLONASE) 50 MCG/ACT nasal spray, Place 2 sprays into both nostrils daily., Disp: 16 g, Rfl: 0   hydrocortisone (ANUSOL-HC) 25 MG suppository, Place 1 suppository (25 mg total) rectally 2 (two) times daily., Disp: 12 suppository, Rfl: 0    hydrocortisone 2.5 % ointment, Apply topically 2 (two) times daily., Disp: 30 g, Rfl: 0   ibuprofen (ADVIL) 800 MG tablet, Take 1 tablet (800 mg total) by mouth every 8 (eight) hours as needed., Disp: 30 tablet, Rfl: 0   ipratropium (ATROVENT) 0.03 % nasal spray, Place 2 sprays into both nostrils every 12 (twelve) hours., Disp: 30 mL, Rfl: 12   predniSONE (DELTASONE) 20 MG tablet, Take 2 tablets (40 mg total) by mouth daily with breakfast., Disp: 10 tablet, Rfl: 0   Medications ordered in this encounter:  Meds ordered this encounter  Medications   neomycin-polymyxin-hydrocortisone (CORTISPORIN) 3.5-10000-1 OTIC suspension    Sig: Place 3 drops into both ears 4 (four) times daily. For 7 days    Dispense:  10 mL    Refill:  0    Supervising Provider:   Merrilee Jansky [[id number]]     *If you need refills on other medications prior to your next appointment, please contact your pharmacy*  Follow-Up: Call back or seek an in-person evaluation if the symptoms worsen or if the condition fails to improve as anticipated.  [facility] [phone]  Other Instructions  Eustachian Tube Dysfunction  Eustachian tube dysfunction refers to a condition in which a blockage develops in the narrow passage that connects the middle ear to the back of the nose (eustachian tube). The eustachian tube regulates air pressure in the middle ear by letting air move between the ear and nose. It also helps to drain fluid from the middle ear space. Eustachian tube dysfunction can affect one or  both ears. When the eustachian tube does not function properly, air pressure, fluid, or both can build up in the middle ear. What are the causes? This condition occurs when the eustachian tube becomes blocked or cannot open normally. Common causes of this condition include: Ear infections. Colds and other infections that affect the nose, mouth, and throat (upper respiratory tract). Allergies. Irritation from  cigarette smoke. Irritation from stomach acid coming up into the esophagus (gastroesophageal reflux). The esophagus is the part of the body that moves food from the mouth to the stomach. Sudden changes in air pressure, such as from descending in an airplane or scuba diving. Abnormal growths in the nose or throat, such as: Growths that line the nose (nasal polyps). Abnormal growth of cells (tumors). Enlarged tissue at the back of the throat (adenoids). What increases the risk? You are more likely to develop this condition if: You smoke. You are overweight. You are a child who has: Certain birth defects of the mouth, such as cleft palate. Large tonsils or adenoids. What are the signs or symptoms? Common symptoms of this condition include: A feeling of fullness in the ear. Ear pain. Clicking or popping noises in the ear. Ringing in the ear (tinnitus). Hearing loss. Loss of balance. Dizziness. Symptoms may get worse when the air pressure around you changes, such as when you travel to an area of high elevation, fly on an airplane, or go scuba diving. How is this diagnosed? This condition may be diagnosed based on: Your symptoms. A physical exam of your ears, nose, and throat. Tests, such as those that measure: The movement of your eardrum. Your hearing (audiometry). How is this treated? Treatment depends on the cause and severity of your condition. In mild cases, you may relieve your symptoms by moving air into your ears. This is called "popping the ears." In more severe cases, or if you have symptoms of fluid in your ears, treatment may include: Medicines to relieve congestion (decongestants). Medicines that treat allergies (antihistamines). Nasal sprays or ear drops that contain medicines that reduce swelling (steroids). A procedure to drain the fluid in your eardrum. In this procedure, a small tube may be placed in the eardrum to: Drain the fluid. Restore the air in the middle  ear space. A procedure to insert a balloon device through the nose to inflate the opening of the eustachian tube (balloon dilation). Follow these instructions at home: Lifestyle Do not do any of the following until your health care provider approves: Travel to high altitudes. Fly in airplanes. Work in a Estate agent or room. Scuba dive. Do not use any products that contain nicotine or tobacco. These products include cigarettes, chewing tobacco, and vaping devices, such as e-cigarettes. If you need help quitting, ask your health care provider. Keep your ears dry. Wear fitted earplugs during showering and bathing. Dry your ears completely after. General instructions Take over-the-counter and prescription medicines only as told by your health care provider. Use techniques to help pop your ears as recommended by your health care provider. These may include: Chewing gum. Yawning. Frequent, forceful swallowing. Closing your mouth, holding your nose closed, and gently blowing as if you are trying to blow air out of your nose. Keep all follow-up visits. This is important. Contact a health care provider if: Your symptoms do not go away after treatment. Your symptoms come back after treatment. You are unable to pop your ears. You have: A fever. Pain in your ear. Pain in your head or  neck. Fluid draining from your ear. Your hearing suddenly changes. You become very dizzy. You lose your balance. Get help right away if: You have a sudden, severe increase in any of your symptoms. Summary Eustachian tube dysfunction refers to a condition in which a blockage develops in the eustachian tube. It can be caused by ear infections, allergies, inhaled irritants, or abnormal growths in the nose or throat. Symptoms may include ear pain or fullness, hearing loss, or ringing in the ears. Mild cases are treated with techniques to unblock the ears, such as yawning or chewing gum. More severe cases are  treated with medicines or procedures. This information is not intended to replace advice given to you by your health care provider. Make sure you discuss any questions you have with your health care provider. Document Revised: 04/09/2020 Document Reviewed: 04/09/2020 Elsevier Patient Education  2024 Elsevier Inc.  Otitis Media, Adult  Otitis media occurs when there is inflammation and fluid in the middle ear with signs and symptoms of an acute infection. The middle ear is a part of the ear that contains bones for hearing as well as air that helps send sounds to the brain. When infected fluid builds up in this space, it causes pressure and can lead to an ear infection. The eustachian tube connects the middle ear to the back of the nose (nasopharynx) and normally allows air into the middle ear. If the eustachian tube becomes blocked, fluid can build up and become infected. What are the causes? This condition is caused by a blockage in the eustachian tube. This can be caused by mucus or by swelling of the tube. Problems that can cause a blockage include: A cold or other upper respiratory infection. Allergies. An irritant, such as tobacco smoke. Enlarged adenoids. The adenoids are areas of soft tissue located high in the back of the throat, behind the nose and the roof of the mouth. They are part of the body's defense system (immune system). A mass in the nasopharynx. Damage to the ear caused by pressure changes (barotrauma). What increases the risk? You are more likely to develop this condition if you: Smoke or are exposed to tobacco smoke. Have an opening in the roof of your mouth (cleft palate). Have gastroesophageal reflux. Have an immune system disorder. What are the signs or symptoms? Symptoms of this condition include: Ear pain. Fever. Decreased hearing. Tiredness (lethargy). Fluid leaking from the ear, if the eardrum is ruptured or has burst. Ringing in the ear. How is this  diagnosed?  This condition is diagnosed with a physical exam. During the exam, your health care provider will use an instrument called an otoscope to look in your ear and check for redness, swelling, and fluid. He or she will also ask about your symptoms. Your health care provider may also order tests, such as: A pneumatic otoscopy. This is a test to check the movement of the eardrum. It is done by squeezing a small amount of air into the ear. A tympanogram. This is a test that shows how well the eardrum moves in response to air pressure in the ear canal. It provides a graph for your health care provider to review. How is this treated? This condition can go away on its own within 3-5 days. But if the condition is caused by a bacterial infection and does not go away on its own, or if it keeps coming back, your health care provider may: Prescribe antibiotic medicine to treat the infection. Prescribe or  recommend medicines to control pain. Follow these instructions at home: Take over-the-counter and prescription medicines only as told by your health care provider. If you were prescribed an antibiotic medicine, take it as told by your health care provider. Do not stop taking the antibiotic even if you start to feel better. Keep all follow-up visits. This is important. Contact a health care provider if: You have bleeding from your nose. There is a lump on your neck. You are not feeling better in 5 days. You feel worse instead of better. Get help right away if: You have severe pain that is not controlled with medicine. You have swelling, redness, or pain around your ear. You have stiffness in your neck. A part of your face is not moving (paralyzed). The bone behind your ear (mastoid bone) is tender when you touch it. You develop a severe headache. Summary Otitis media is redness, soreness, and swelling of the middle ear, usually resulting in pain and decreased hearing. This condition can go  away on its own within 3-5 days. If the problem does not go away in 3-5 days, your health care provider may give you medicines to treat the infection. If you were prescribed an antibiotic medicine, take it as told by your health care provider. Follow all instructions that were given to you by your health care provider. This information is not intended to replace advice given to you by your health care provider. Make sure you discuss any questions you have with your health care provider. Document Revised: 05/07/2020 Document Reviewed: 05/07/2020 Elsevier Patient Education  2024 Elsevier Inc.   If you have been instructed to have an in-person evaluation today at a local Urgent Care facility, please use the link below. It will take you to a list of all of our available [facility], including address, phone number and hours of operation. Please do not delay care.  [facility]  If you or a family member do not have a primary care provider, use the link below to schedule a visit and establish care. When you choose a [facility] primary care physician or advanced practice provider, you gain a long-term partner in health. Find a Primary Care Provider  Learn more about [facility]'s in-office and virtual care options: [facility] - Get Care Now

## 2023-03-25 NOTE — Progress Notes (Signed)
 Virtual Visit Consent   Jeffrey Chang, you are scheduled for a virtual visit with a [facility] provider today. Just as with appointments in the office, your consent must be obtained to participate. Your consent will be active for this visit and any virtual visit you may have with one of our providers in the next 365 days. If you have a MyChart account, a copy of this consent can be sent to you electronically.  As this is a virtual visit, video technology does not allow for your provider to perform a traditional examination. This may limit your provider's ability to fully assess your condition. If your provider identifies any concerns that need to be evaluated in person or the need to arrange testing (such as labs, EKG, etc.), we will make arrangements to do so. Although advances in technology are sophisticated, we cannot ensure that it will always work on either your end or our end. If the connection with a video visit is poor, the visit may have to be switched to a telephone visit. With either a video or telephone visit, we are not always able to ensure that we have a secure connection.  By engaging in this virtual visit, you consent to the provision of healthcare and authorize for your insurance to be billed (if applicable) for the services provided during this visit. Depending on your insurance coverage, you may receive a charge related to this service.  I need to obtain your verbal consent now. Are you willing to proceed with your visit today? [name] has provided verbal consent on 03/25/2023 for a virtual visit (video or telephone). Margaretann Loveless, PA-C  Date: 03/25/2023 12:08 PM   Virtual Visit via Video Note   I, Margaretann Loveless, connected with  Jeffrey Chang  ([id number], 19-Oct-1995) on 03/25/23 at 12:00 PM EST by a video-enabled telemedicine application and verified that I am speaking with the correct person using two identifiers.  Location: Patient: Virtual Visit  Location Patient: Mobile Provider: Virtual Visit Location Provider: Home Office   I discussed the limitations of evaluation and management by telemedicine and the availability of in person appointments. The patient expressed understanding and agreed to proceed.    History of Present Illness: Jeffrey Chang is a 28 y.o. who identifies as a male who was assigned male at birth, and is being seen today for ear pain, bilateral.  HPI: Otalgia  There is pain in both ears. This is a new problem. Episode onset: Did an E-visit last night and was prescribed Augmentin. The problem occurs constantly. The problem has been gradually improving (used old ear drop Rx and reports it did help; would like to try that again before starting oral antibiotic). There has been no fever. Associated symptoms include headaches and neck pain. Pertinent negatives include no coughing, ear discharge, hearing loss, rhinorrhea or sore throat. He has tried nothing for the symptoms. The treatment provided no relief. His past medical history is significant for a chronic ear infection.     Problems:  Patient Active Problem List   Diagnosis Date Noted   Cannabis abuse with psychotic disorder, with delusions (HCC) 07/07/2016    Allergies:  Allergies  Allergen Reactions   Latex    Medications:  Current Outpatient Medications:    neomycin-polymyxin-hydrocortisone (CORTISPORIN) 3.5-10000-1 OTIC suspension, Place 3 drops into both ears 4 (four) times daily. For 7 days, Disp: 10 mL, Rfl: 0   albuterol (VENTOLIN HFA) 108 (90 Base) MCG/ACT inhaler, Inhale 1-2 puffs into the lungs every  6 (six) hours as needed for wheezing or shortness of breath., Disp: 1 each, Rfl: 0   amoxicillin-clavulanate (AUGMENTIN) 875-125 MG tablet, Take 1 tablet by mouth 2 (two) times daily., Disp: 20 tablet, Rfl: 0   clotrimazole-betamethasone (LOTRISONE) cream, Apply 1 Application topically daily., Disp: 30 g, Rfl: 0   cyclobenzaprine (FLEXERIL) 5 MG tablet,  Take 1 tablet (5 mg total) by mouth at bedtime as needed for muscle spasms., Disp: 10 tablet, Rfl: 0   dicyclomine (BENTYL) 10 MG capsule, Take 1 capsule (10 mg total) by mouth 3 (three) times daily before meals., Disp: 15 capsule, Rfl: 0   fluconazole (DIFLUCAN) 150 MG tablet, Take 1 tablet (150 mg total) by mouth once a week., Disp: 2 tablet, Rfl: 0   fluticasone (FLONASE) 50 MCG/ACT nasal spray, Place 2 sprays into both nostrils daily., Disp: 16 g, Rfl: 0   hydrocortisone (ANUSOL-HC) 25 MG suppository, Place 1 suppository (25 mg total) rectally 2 (two) times daily., Disp: 12 suppository, Rfl: 0   hydrocortisone 2.5 % ointment, Apply topically 2 (two) times daily., Disp: 30 g, Rfl: 0   ibuprofen (ADVIL) 800 MG tablet, Take 1 tablet (800 mg total) by mouth every 8 (eight) hours as needed., Disp: 30 tablet, Rfl: 0   ipratropium (ATROVENT) 0.03 % nasal spray, Place 2 sprays into both nostrils every 12 (twelve) hours., Disp: 30 mL, Rfl: 12   predniSONE (DELTASONE) 20 MG tablet, Take 2 tablets (40 mg total) by mouth daily with breakfast., Disp: 10 tablet, Rfl: 0  Observations/Objective: Patient is well-developed, well-nourished in no acute distress.  Resting comfortably  Head is normocephalic, atraumatic.  No labored breathing.  Speech is clear and coherent with logical content.  Patient is alert and oriented at baseline.    Assessment and Plan: 1. Recurrent acute serous otitis media of both ears (Primary) - neomycin-polymyxin-hydrocortisone (CORTISPORIN) 3.5-10000-1 OTIC suspension; Place 3 drops into both ears 4 (four) times daily. For 7 days  Dispense: 10 mL; Refill: 0  - Worsening symptoms that have not responded to OTC medications.  - Will give Cortisporin ear drops seem to be helping and has been refilled - Continue saline nasal rinses - Could consider to add Flonase (Fluticasone) nasal spray over the counter for possible eustachian tube dysfunction - Steam and humidifier can help -  Warm compress to ear - Stay well hydrated and get plenty of rest.  - Seek in person evaluation if no symptom improvement or if symptoms worsen   Follow Up Instructions: I discussed the assessment and treatment plan with the patient. The patient was provided an opportunity to ask questions and all were answered. The patient agreed with the plan and demonstrated an understanding of the instructions.  A copy of instructions were sent to the patient via MyChart unless otherwise noted below.    The patient was advised to call back or seek an in-person evaluation if the symptoms worsen or if the condition fails to improve as anticipated.    Margaretann Loveless, PA-C

## 2023-03-26 ENCOUNTER — Telehealth: Payer: Managed Care, Other (non HMO) | Admitting: Family Medicine

## 2023-03-26 DIAGNOSIS — B370 Candidal stomatitis: Secondary | ICD-10-CM

## 2023-03-26 MED ORDER — NYSTATIN 100000 UNIT/ML MT SUSP: 5.0000 mL | Freq: Four times a day (QID) | OROMUCOSAL | 0 refills | Status: DC

## 2023-03-26 NOTE — Progress Notes (Signed)
E Visit for Rash  We are sorry that you are not feeling well. Here is how we plan to help!  Based upon your presentation it appears you have a fungal infection.  I have prescribed: Ginette Pitman is a fungal infection that is common in some after taking antibiotic. I have ordered you an oral swish to help.    HOME CARE:  Take cool showers and avoid direct sunlight. Apply cool compress or wet dressings. Take a bath in an oatmeal bath.  Sprinkle content of one Aveeno packet under running faucet with comfortably warm water.  Bathe for 15-20 minutes, 1-2 times daily.  Pat dry with a towel. Do not rub the rash. Use hydrocortisone cream. Take an antihistamine like Benadryl for widespread rashes that itch.  The adult dose of Benadryl is 25-50 mg by mouth 4 times daily. Caution:  This type of medication may cause sleepiness.  Do not drink alcohol, drive, or operate dangerous machinery while taking antihistamines.  Do not take these medications if you have prostate enlargement.  Read package instructions thoroughly on all medications that you take.  GET HELP RIGHT AWAY IF:  Symptoms don't go away after treatment. Severe itching that persists. If you rash spreads or swells. If you rash begins to smell. If it blisters and opens or develops a yellow-brown crust. You develop a fever. You have a sore throat. You become short of breath.  MAKE SURE YOU:  Understand these instructions. Will watch your condition. Will get help right away if you are not doing well or get worse.  Thank you for choosing an e-visit.  Your e-visit answers were reviewed by a board certified advanced clinical practitioner to complete your personal care plan. Depending upon the condition, your plan could have included both over the counter or prescription medications.  Please review your pharmacy choice. Make sure the pharmacy is open so you can pick up prescription now. If there is a problem, you may contact your provider  through [employer] and have the prescription routed to another pharmacy.  Your safety is important to [place]. If you have drug allergies check your prescription carefully.   For the next 24 hours you can use MyChart to ask questions about today's visit, request a non-urgent call back, or ask for a work or school excuse. You will get an email in the next two days asking about your experience. I hope that your e-visit has been valuable and will speed your recovery.  I provided 5 minutes of non face-to-face time during this encounter for chart review, medication and order placement, as well as and documentation.

## 2023-04-05 ENCOUNTER — Emergency Department (HOSPITAL_BASED_OUTPATIENT_CLINIC_OR_DEPARTMENT_OTHER)
Admission: EM | Admit: 2023-04-05 | Discharge: 2023-04-06 | Disposition: A | Discharge status: Home / Self Care | Payer: Managed Care, Other (non HMO) | Attending: Emergency Medicine | Admitting: Emergency Medicine

## 2023-04-05 ENCOUNTER — Other Ambulatory Visit: Payer: Self-pay

## 2023-04-05 ENCOUNTER — Encounter (HOSPITAL_BASED_OUTPATIENT_CLINIC_OR_DEPARTMENT_OTHER): Payer: Self-pay

## 2023-04-05 DIAGNOSIS — R519 Headache, unspecified: Secondary | ICD-10-CM | POA: Insufficient documentation

## 2023-04-05 DIAGNOSIS — Z91040 Latex allergy status: Secondary | ICD-10-CM | POA: Insufficient documentation

## 2023-04-05 DIAGNOSIS — G8929 Other chronic pain: Secondary | ICD-10-CM

## 2023-04-05 DIAGNOSIS — R443 Hallucinations, unspecified: Secondary | ICD-10-CM | POA: Insufficient documentation

## 2023-04-05 MED ORDER — IBUPROFEN 800 MG PO TABS
800.0000 mg | ORAL_TABLET | Freq: Once | ORAL | Status: AC
  Administered 2023-04-06: 800 mg via ORAL
  Filled 2023-04-05: qty 1

## 2023-04-05 NOTE — ED Triage Notes (Signed)
 Says he is diagnosed with schizophrenia but has been un-medicated since 2019.   Family has encouraged him to start medications to improve himself.   Pt says he is able to drive, work daily and complete all ADL's but sometimes he has to make himself do it. He wants to make these things easier.   Denies si/hi.

## 2023-04-05 NOTE — Discharge Instructions (Addendum)
 You were seen today requesting medications for your schizophrenia.  Go to behavioral health urgent care for assessment.  They can provide you with follow-up and start you on medications.  For ongoing headaches, trial ibuprofen.

## 2023-04-05 NOTE — ED Provider Notes (Addendum)
 [place] EMERGENCY DEPARTMENT AT [facility] Provider Note   CSN: [id number] Arrival date & time: 04/05/23  2159     History  Chief Complaint  Patient presents with   Mental Health Problem    Jeffrey Chang is a 28 y.o. male.  HPI     This is a 28 year old male with a history of schizophrenia who presents requesting medications.  Patient reports that he was diagnosed with schizophrenia in 2018.  He was initially on medications but stopped taking them because "I was young."  He states that he occasionally hears voices but notes that they are related to his schizophrenia.  Denies SI or HI.  States "it is just time to get back on medication."  Reports that he cares for his family and does not have any difficulty doing so.  He has insight regarding his disease.  He does not see a counselor or psychiatrist regularly.  Reports some regular headaches but this is unchanged from prior.  Denies any focal neurologic symptoms.  Of note, prior records indicate bipolar diagnosis.  Do not see indication of schizophrenia although patient indicates this was his diagnosis.   Home Medications Prior to Admission medications   Medication Sig Start Date End Date Taking? Authorizing Provider  albuterol (VENTOLIN HFA) 108 (90 Base) MCG/ACT inhaler Inhale 1-2 puffs into the lungs every 6 (six) hours as needed for wheezing or shortness of breath. 12/26/22   Radford Pax, NP  amoxicillin-clavulanate (AUGMENTIN) 875-125 MG tablet Take 1 tablet by mouth 2 (two) times daily. 03/24/23   Ward, Tylene Fantasia, PA-C  clotrimazole-betamethasone (LOTRISONE) cream Apply 1 Application topically daily. 02/21/23   Ward, Tylene Fantasia, PA-C  cyclobenzaprine (FLEXERIL) 5 MG tablet Take 1 tablet (5 mg total) by mouth at bedtime as needed for muscle spasms. 03/20/23   Evern Core, PA-C  dicyclomine (BENTYL) 10 MG capsule Take 1 capsule (10 mg total) by mouth 3 (three) times daily before meals. 03/05/23   Waldon Merl,  PA-C  fluconazole (DIFLUCAN) 150 MG tablet Take 1 tablet (150 mg total) by mouth once a week. 01/28/23   Waldon Merl, PA-C  fluticasone (FLONASE) 50 MCG/ACT nasal spray Place 2 sprays into both nostrils daily. 11/17/22   Margaretann Loveless, PA-C  hydrocortisone (ANUSOL-HC) 25 MG suppository Place 1 suppository (25 mg total) rectally 2 (two) times daily. 02/03/23   Ward, Tylene Fantasia, PA-C  hydrocortisone 2.5 % ointment Apply topically 2 (two) times daily. 03/12/23   Viviano Simas, FNP  ibuprofen (ADVIL) 800 MG tablet Take 1 tablet (800 mg total) by mouth every 8 (eight) hours as needed. 03/01/23   Mayers, Cari S, PA-C  ipratropium (ATROVENT) 0.03 % nasal spray Place 2 sprays into both nostrils every 12 (twelve) hours. 09/23/22   Viviano Simas, FNP  neomycin-polymyxin-hydrocortisone (CORTISPORIN) 3.5-10000-1 OTIC suspension Place 3 drops into both ears 4 (four) times daily. For 7 days 03/25/23   Margaretann Loveless, PA-C  nystatin (MYCOSTATIN) 100000 UNIT/ML suspension Take 5 mLs (500,000 Units total) by mouth 4 (four) times daily. 03/26/23   Freddy Finner, NP  predniSONE (DELTASONE) 20 MG tablet Take 2 tablets (40 mg total) by mouth daily with breakfast. 03/20/23   Evern Core, PA-C      Allergies    Latex    Review of Systems   Review of Systems  Constitutional:  Negative for fever.  Respiratory:  Negative for shortness of breath.   Cardiovascular:  Negative for chest pain.  Neurological:  Positive  for headaches.  Psychiatric/Behavioral:  Positive for hallucinations. Negative for suicidal ideas.   All other systems reviewed and are negative.   Physical Exam Updated Vital Signs BP (!) 147/84 (BP Location: Right Arm)   Pulse (!) 106   Temp (!) 97.2 F (36.2 C)   Resp 17   Ht 1.829 m (6')   Wt 81.6 kg   SpO2 97%   BMI 24.41 kg/m  Physical Exam Vitals and nursing note reviewed.  Constitutional:      Appearance: He is well-developed. He is not ill-appearing.  HENT:     Head:  Normocephalic and atraumatic.  Eyes:     Pupils: Pupils are equal, round, and reactive to light.  Cardiovascular:     Rate and Rhythm: Normal rate and regular rhythm.     Heart sounds: Normal heart sounds. No murmur heard. Pulmonary:     Effort: Pulmonary effort is normal. No respiratory distress.     Breath sounds: Normal breath sounds. No wheezing.  Abdominal:     Palpations: Abdomen is soft.     Tenderness: There is no abdominal tenderness.  Musculoskeletal:     Cervical back: Neck supple.  Lymphadenopathy:     Cervical: No cervical adenopathy.  Skin:    General: Skin is warm and dry.  Neurological:     Mental Status: He is alert and oriented to person, place, and time.  Psychiatric:        Mood and Affect: Mood normal.        Judgment: Judgment normal.     ED Results / Procedures / Treatments   Labs (all labs ordered are listed, but only abnormal results are displayed) Labs Reviewed - No data to display  EKG None  Radiology No results found.  Procedures Procedures    Medications Ordered in ED Medications  ibuprofen (ADVIL) tablet 800 mg (has no administration in time range)    ED Course/ Medical Decision Making/ A&P                                 Medical Decision Making Risk Prescription drug management.   This patient presents to the ED for concern of requesting medications, this involves an extensive number of treatment options, and is a complaint that carries with it a high risk of complications and morbidity.  I considered the following differential and admission for this acute, potentially life threatening condition.  The differential diagnosis includes acute psychosis, psychotic break  MDM:    This is a 28 year old male who presents requesting medications for schizophrenia.  He is nontoxic.  Vital signs notable for mild tachycardia.  He does not appear to be acutely psychotic.  He appears to have insight into his disease.  No SI or HI.  Reports  occasional alcohol use but does not appear intoxicated or under the influence of drugs.  Discussed with him that he would most likely benefit from being seen at our behavioral health urgent care.  At this time I do not feel he has an acute emergent process.  They can evaluate him and discuss initiating medications and follow-up with psychiatry.  Regarding his headaches, there do not seem to be any red flags.  Recommend he trial ibuprofen.  (Labs, imaging, consults)  Labs: I Ordered, and personally interpreted labs.  The pertinent results include: None  Imaging Studies ordered: I ordered imaging studies including none I independently visualized and interpreted imaging. I  agree with the radiologist interpretation  Additional history obtained from chart review.  External records from outside source obtained and reviewed including prior evaluations  Cardiac Monitoring: The patient was not maintained on a cardiac monitor.  If on the cardiac monitor, I personally viewed and interpreted the cardiac monitored which showed an underlying rhythm of: N/A  Reevaluation: After the interventions noted above, I reevaluated the patient and found that they have :stayed the same  Social Determinants of Health:  lives independently  Disposition: Discharge  Co morbidities that complicate the patient evaluation  Past Medical History:  Diagnosis Date   Bipolar 1 disorder (HCC)    Manic depressive disorder (HCC)      Medicines Meds ordered this encounter  Medications   ibuprofen (ADVIL) tablet 800 mg    I have reviewed the patients home medicines and have made adjustments as needed  Problem List / ED Course: Problem List Items Addressed This Visit   None Visit Diagnoses       Chronic nonintractable headache, unspecified headache type    -  Primary   Relevant Medications   ibuprofen (ADVIL) tablet 800 mg (Start on 04/05/2023 11:45 PM)     Hallucination                        Final Clinical Impression(s) / ED Diagnoses Final diagnoses:  Chronic nonintractable headache, unspecified headache type  Hallucination    Rx / DC Orders ED Discharge Orders     None         Sandria Mcenroe, Mayer Masker, MD 04/05/23 1610    Shon Baton, MD 04/05/23 2344

## 2023-04-06 DIAGNOSIS — R443 Hallucinations, unspecified: Secondary | ICD-10-CM | POA: Diagnosis not present

## 2023-04-09 ENCOUNTER — Telehealth: Payer: Managed Care, Other (non HMO) | Admitting: Nurse Practitioner

## 2023-04-09 DIAGNOSIS — T7840XA Allergy, unspecified, initial encounter: Secondary | ICD-10-CM

## 2023-04-09 MED ORDER — ALBUTEROL SULFATE HFA 108 (90 BASE) MCG/ACT IN AERS: 2.0000 | INHALATION_SPRAY | Freq: Four times a day (QID) | RESPIRATORY_TRACT | 0 refills | Status: DC | PRN

## 2023-04-09 MED ORDER — FAMOTIDINE 20 MG PO TABS: 20.0000 mg | ORAL_TABLET | Freq: Two times a day (BID) | ORAL | 0 refills | Status: DC

## 2023-04-09 MED ORDER — HYDROXYZINE PAMOATE 25 MG PO CAPS: 25.0000 mg | ORAL_CAPSULE | Freq: Three times a day (TID) | ORAL | 0 refills | Status: DC | PRN

## 2023-04-09 NOTE — Progress Notes (Signed)
 Virtual Visit Consent   Jeffrey Chang, you are scheduled for a virtual visit with a [place] provider today. Just as with appointments in the office, your consent must be obtained to participate. Your consent will be active for this visit and any virtual visit you may have with one of our providers in the next 365 days. If you have a MyChart account, a copy of this consent can be sent to you electronically.  As this is a virtual visit, video technology does not allow for your provider to perform a traditional examination. This may limit your provider's ability to fully assess your condition. If your provider identifies any concerns that need to be evaluated in person or the need to arrange testing (such as labs, EKG, etc.), we will make arrangements to do so. Although advances in technology are sophisticated, we cannot ensure that it will always work on either your end or our end. If the connection with a video visit is poor, the visit may have to be switched to a telephone visit. With either a video or telephone visit, we are not always able to ensure that we have a secure connection.  By engaging in this virtual visit, you consent to the provision of healthcare and authorize for your insurance to be billed (if applicable) for the services provided during this visit. Depending on your insurance coverage, you may receive a charge related to this service.  I need to obtain your verbal consent now. Are you willing to proceed with your visit today? Jeffrey Chang has provided verbal consent on 04/09/2023 for a virtual visit (video or telephone). Viviano Simas, FNP  Date: 04/09/2023 6:05 PM   Virtual Visit via Video Note   I, Viviano Simas, connected with  Jeffrey Chang  ([id number], 07-13-1995) on 04/09/23 at  6:15 PM EST by a video-enabled telemedicine application and verified that I am speaking with the correct person using two identifiers.  Location: Patient: Virtual Visit Location Patient:  Home Provider: Virtual Visit Location Provider: Home Office   I discussed the limitations of evaluation and management by telemedicine and the availability of in person appointments. The patient expressed understanding and agreed to proceed.    History of Present Illness: Jeffrey Chang is a 28 y.o. who identifies as a male who was assigned male at birth, and is being seen today for concern over a possible allergic reaction.   He has a history of hemorrhoids  He has been using Prep H and hydrocortisone for relief and feels like he has developed some side effects mainly his skin feels "stuck" and his throat feels scratchy  Feels that since he started using it he was having a reaction but has been pushing through that/taking baths for relief of localized skin irritation  The hemorrhoids have improved but his skin now feels itchy all over Denies SOB or any respiratory symptoms   He has taken a Benadryl for relief every other day for 2 weeks before bed  Feels that the constipation medicine makes him sleepy so he has not been using that regularly   In the past month he has had several E visits for multiple complaint, was seen in the UC for strain of neck muscles and most recently was seen in the ED for chronic headaches and hallucinations    He has not been able to establish with a primary care provider   He took a flexeril two days ago  Was on prednisone 03/20/23 for the strain in his neck  Most recently used hydroxyzine 3 weeks ago    Problems:  Patient Active Problem List   Diagnosis Date Noted   Cannabis abuse with psychotic disorder, with delusions (HCC) 07/07/2016    Allergies:  Allergies  Allergen Reactions   Latex    Medications:  Current Outpatient Medications:    hydrOXYzine (VISTARIL) 25 MG capsule, Take 50 mg by mouth every 8 (eight) hours as needed., Disp: , Rfl:    albuterol (VENTOLIN HFA) 108 (90 Base) MCG/ACT inhaler, Inhale 1-2 puffs into the lungs every 6 (six)  hours as needed for wheezing or shortness of breath., Disp: 1 each, Rfl: 0   amoxicillin-clavulanate (AUGMENTIN) 875-125 MG tablet, Take 1 tablet by mouth 2 (two) times daily., Disp: 20 tablet, Rfl: 0   clotrimazole-betamethasone (LOTRISONE) cream, Apply 1 Application topically daily., Disp: 30 g, Rfl: 0   cyclobenzaprine (FLEXERIL) 5 MG tablet, Take 1 tablet (5 mg total) by mouth at bedtime as needed for muscle spasms., Disp: 10 tablet, Rfl: 0   dicyclomine (BENTYL) 10 MG capsule, Take 1 capsule (10 mg total) by mouth 3 (three) times daily before meals., Disp: 15 capsule, Rfl: 0   fluconazole (DIFLUCAN) 150 MG tablet, Take 1 tablet (150 mg total) by mouth once a week., Disp: 2 tablet, Rfl: 0   fluticasone (FLONASE) 50 MCG/ACT nasal spray, Place 2 sprays into both nostrils daily., Disp: 16 g, Rfl: 0   hydrocortisone (ANUSOL-HC) 25 MG suppository, Place 1 suppository (25 mg total) rectally 2 (two) times daily., Disp: 12 suppository, Rfl: 0   hydrocortisone 2.5 % ointment, Apply topically 2 (two) times daily., Disp: 30 g, Rfl: 0   ibuprofen (ADVIL) 800 MG tablet, Take 1 tablet (800 mg total) by mouth every 8 (eight) hours as needed., Disp: 30 tablet, Rfl: 0   ipratropium (ATROVENT) 0.03 % nasal spray, Place 2 sprays into both nostrils every 12 (twelve) hours., Disp: 30 mL, Rfl: 12   neomycin-polymyxin-hydrocortisone (CORTISPORIN) 3.5-10000-1 OTIC suspension, Place 3 drops into both ears 4 (four) times daily. For 7 days, Disp: 10 mL, Rfl: 0   nystatin (MYCOSTATIN) 100000 UNIT/ML suspension, Take 5 mLs (500,000 Units total) by mouth 4 (four) times daily., Disp: 60 mL, Rfl: 0   predniSONE (DELTASONE) 20 MG tablet, Take 2 tablets (40 mg total) by mouth daily with breakfast., Disp: 10 tablet, Rfl: 0  Observations/Objective: Patient is well-developed, well-nourished in no acute distress.  Resting comfortably  at home.  Head is normocephalic, atraumatic.  No labored breathing.  Speech is clear and  coherent with logical content.  Patient is alert and oriented at baseline.    Assessment and Plan:  1. Allergic reaction, initial encounter (Primary)  - famotidine (PEPCID) 20 MG tablet; Take 1 tablet (20 mg total) by mouth 2 (two) times daily for 14 days.  Dispense: 28 tablet; Refill: 0  Refilled: - albuterol (VENTOLIN HFA) 108 (90 Base) MCG/ACT inhaler; Inhale 2 puffs into the lungs every 6 (six) hours as needed for wheezing or shortness of breath.  Dispense: 8 g; Refill: 0  Advised to use before bed, or on weekends when not working. Discussed side effects of drowsiness  - hydrOXYzine (VISTARIL) 25 MG capsule; Take 1 capsule (25 mg total) by mouth every 8 (eight) hours as needed for itching (will cause drowsiness).  Dispense: 30 capsule; Refill: 0      Follow Up Instructions: I discussed the assessment and treatment plan with the patient. The patient was provided an opportunity to ask questions and all were  answered. The patient agreed with the plan and demonstrated an understanding of the instructions.  A copy of instructions were sent to the patient via MyChart unless otherwise noted below.    The patient was advised to call back or seek an in-person evaluation if the symptoms worsen or if the condition fails to improve as anticipated.    Viviano Simas, FNP

## 2023-04-14 ENCOUNTER — Telehealth

## 2023-04-15 ENCOUNTER — Telehealth: Admitting: Physician Assistant

## 2023-04-15 DIAGNOSIS — R200 Anesthesia of skin: Secondary | ICD-10-CM | POA: Diagnosis not present

## 2023-04-15 MED ORDER — MELOXICAM 15 MG PO TABS
15.0000 mg | ORAL_TABLET | Freq: Every day | ORAL | 0 refills | Status: DC
Start: 2023-04-15 — End: 2023-05-01
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
## 2023-04-20 ENCOUNTER — Ambulatory Visit
Admission: EM | Admit: 2023-04-20 | Discharge: 2023-04-20 | Disposition: A | Discharge status: Home / Self Care | Attending: Physician Assistant | Admitting: Physician Assistant

## 2023-04-20 DIAGNOSIS — B350 Tinea barbae and tinea capitis: Secondary | ICD-10-CM | POA: Diagnosis not present

## 2023-04-20 MED ORDER — KETOCONAZOLE 1 % EX SHAM
1.0000 | MEDICATED_SHAMPOO | CUTANEOUS | 0 refills | Status: DC
Start: 2023-04-20 — End: 2023-05-01

## 2023-04-20 NOTE — Discharge Instructions (Signed)
  Please follow up with primary care provider as planned.

## 2023-04-20 NOTE — ED Triage Notes (Signed)
"  I think I have a fungal infection in various areas on my body, my scalp, fingernails, and toenails". No other concerns.

## 2023-04-20 NOTE — ED Provider Notes (Signed)
 [name]    CSN: [id number] Arrival date & time: 04/20/23  1133      History   Chief Complaint Chief Complaint  Patient presents with   Skin Problem    HPI Jeffrey Chang is a 28 y.o. male.   Patient here today for evaluation of possible fungal infection to his nails and scalp.  He reports that he is also concerned about his toenails.  He reports itching to his scalp.  He does have primary care provider and does have appointment upcoming later this month.  He does not report any fever or other symptoms.  The history is provided by the patient.    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   History reviewed. No pertinent surgical history.     Home Medications    Prior to Admission medications   Medication Sig Start Date End Date Taking? Authorizing Provider  KETOCONAZOLE, TOPICAL, 1 % SHAM Apply 1 Application topically every other day. 04/20/23  Yes Tomi Bamberger, PA-C  meloxicam (MOBIC) 15 MG tablet Take 1 tablet (15 mg total) by mouth daily. 04/15/23  Yes Margaretann Loveless, PA-C  albuterol (VENTOLIN HFA) 108 (90 Base) MCG/ACT inhaler Inhale 1-2 puffs into the lungs every 6 (six) hours as needed for wheezing or shortness of breath. 12/26/22   Radford Pax, NP  albuterol (VENTOLIN HFA) 108 (90 Base) MCG/ACT inhaler Inhale 2 puffs into the lungs every 6 (six) hours as needed for wheezing or shortness of breath. 04/09/23   Viviano Simas, FNP  clotrimazole-betamethasone (LOTRISONE) cream Apply 1 Application topically daily. 02/21/23   Ward, Tylene Fantasia, PA-C  cyclobenzaprine (FLEXERIL) 5 MG tablet Take 1 tablet (5 mg total) by mouth at bedtime as needed for muscle spasms. 03/20/23   Evern Core, PA-C  dicyclomine (BENTYL) 10 MG capsule Take 1 capsule (10 mg  total) by mouth 3 (three) times daily before meals. 03/05/23   Waldon Merl, PA-C  famotidine (PEPCID) 20 MG tablet Take 1 tablet (20 mg total) by mouth 2 (two) times daily for 14 days. 04/09/23 04/23/23  Viviano Simas, FNP  fluconazole (DIFLUCAN) 150 MG tablet Take 1 tablet (150 mg total) by mouth once a week. 01/28/23   Waldon Merl, PA-C  fluticasone (FLONASE) 50 MCG/ACT nasal spray Place 2 sprays into both nostrils daily. 11/17/22   Margaretann Loveless, PA-C  hydrocortisone (ANUSOL-HC) 25 MG suppository Place 1 suppository (25 mg total) rectally 2 (two) times daily. 02/03/23   Ward, Tylene Fantasia, PA-C  hydrocortisone 2.5 % ointment Apply topically 2 (two) times daily. 03/12/23   Viviano Simas, FNP  hydrOXYzine (VISTARIL) 25 MG capsule Take 50 mg by mouth every 8 (eight) hours as needed. 10/22/22   [provider]  hydrOXYzine (VISTARIL) 25 MG capsule Take 1 capsule (25 mg total) by mouth every 8 (eight) hours as needed for itching (will cause drowsiness). 04/09/23   Viviano Simas, FNP  ibuprofen (ADVIL) 800 MG tablet Take 1 tablet (800 mg total) by mouth every 8 (eight) hours as needed. 03/01/23   Mayers, Cari S, PA-C  ipratropium (ATROVENT) 0.03 % nasal spray Place 2 sprays into both nostrils every 12 (twelve) hours. 09/23/22   Viviano Simas, FNP  neomycin-polymyxin-hydrocortisone (CORTISPORIN) 3.5-10000-1 OTIC suspension Place 3 drops into both  ears 4 (four) times daily. For 7 days 03/25/23   Margaretann Loveless, PA-C  nystatin (MYCOSTATIN) 100000 UNIT/ML suspension Take 5 mLs (500,000 Units total) by mouth 4 (four) times daily. 03/26/23   Freddy Finner, NP  predniSONE (DELTASONE) 20 MG tablet Take 2 tablets (40 mg total) by mouth daily with breakfast. 03/20/23   Evern Core, PA-C    Family History History reviewed. No pertinent family history.  Social History Social History   Tobacco Use   Smoking status: Every Day    Types: Cigars   Smokeless tobacco: Never  Vaping Use    Vaping status: Never Used  Substance Use Topics   Alcohol use: Yes    Comment: Occassionally.   Drug use: Not Currently     Allergies   Latex and Anusol-hc [hydrocortisone]   Review of Systems Review of Systems  Constitutional:  Negative for chills and fever.  Eyes:  Negative for discharge and redness.  Respiratory:  Negative for shortness of breath.   Skin:  Negative for color change and rash.  Neurological:  Negative for numbness.     Physical Exam Triage Vital Signs ED Triage Vitals  Encounter Vitals Group     BP --      Systolic BP Percentile --      Diastolic BP Percentile --      Pulse --      Resp --      Temp --      Temp src --      SpO2 --      Weight 04/20/23 1234 185 lb (83.9 kg)     Height 04/20/23 1234 6' (1.829 m)     Head Circumference --      Peak Flow --      Pain Score 04/20/23 1232 0     Pain Loc --      Pain Education --      Exclude from Growth Chart --    No data found.  Updated Vital Signs BP 133/82 (BP Location: Right Arm)   Pulse 92   Temp 98.1 F (36.7 C) (Oral)   Resp 18   Ht 6' (1.829 m)   Wt 185 lb (83.9 kg)   SpO2 98%   BMI 25.09 kg/m   Visual Acuity Right Eye Distance:   Left Eye Distance:   Bilateral Distance:    Right Eye Near:   Left Eye Near:    Bilateral Near:     Physical Exam Vitals and nursing note reviewed.  Constitutional:      General: He is not in acute distress.    Appearance: Normal appearance. He is not ill-appearing.  HENT:     Head: Normocephalic and atraumatic.  Eyes:     Conjunctiva/sclera: Conjunctivae normal.  Cardiovascular:     Rate and Rhythm: Normal rate.  Pulmonary:     Effort: Pulmonary effort is normal. No respiratory distress.  Skin:    Comments: Thickened fingernails noted, dryness appreciated to scalp.    Neurological:     Mental Status: He is alert.  Psychiatric:        Mood and Affect: Mood normal.        Behavior: Behavior normal.        Thought Content: Thought  content normal.      UC Treatments / Results  Labs (all labs ordered are listed, but only abnormal results are displayed) Labs Reviewed - No data to display  EKG   Radiology No results  found.  Procedures Procedures (including critical care time)  Medications Ordered in UC Medications - No data to display  Initial Impression / Assessment and Plan / UC Course  I have reviewed the triage vital signs and the nursing notes.  Pertinent labs & imaging results that were available during my care of the patient were reviewed by me and considered in my medical decision making (see chart for details).    Will treat with ketoconazole shampoo to hopefully improve scalp issues but discussed routinely oral antifungals are needed for nail involvement and he would need to see primary care for this given duration of treatment and monitoring.  Patient is agreeable to same and will follow-up with primary care at the end of the month as scheduled.  Encouraged sooner follow-up with any further concerns.  Final Clinical Impressions(s) / UC Diagnoses   Final diagnoses:  Tinea capitis     Discharge Instructions       Please follow up with primary care provider as planned.        ED Prescriptions     Medication Sig Dispense Auth. Provider   KETOCONAZOLE, TOPICAL, 1 % SHAM Apply 1 Application topically every other day. 200 mL Tomi Bamberger, PA-C      PDMP not reviewed this encounter.   Tomi Bamberger, PA-C 04/20/23 1345

## 2023-04-22 ENCOUNTER — Ambulatory Visit: Admitting: Family Medicine

## 2023-04-29 ENCOUNTER — Telehealth: Admitting: Physician Assistant

## 2023-04-29 DIAGNOSIS — B353 Tinea pedis: Secondary | ICD-10-CM | POA: Diagnosis not present

## 2023-04-29 MED ORDER — FLUCONAZOLE 150 MG PO TABS
ORAL_TABLET | ORAL | 0 refills | Status: DC
Start: 2023-04-29 — End: 2023-05-01

## 2023-04-29 NOTE — Progress Notes (Signed)
 E-Visit for Athlete's Foot  We are sorry that you are not feeling well. Here is how we plan to help!  Based on what you shared with me it looks like you have tinea pedis, or "Athlete's Foot".  This type of rash can spread through shared towels, clothing, bedding, etc., as well as hard surfaces (particularly in moist areas) such as shower stalls, locker room floors, pool areas, etc. The symptoms of Athlete's Foot include red, swollen, peeling, itchy skin between the toes (especially between the pinky toe and the one next to it). The sole and heel of the foot may also be affected. In severe cases, the skin on the feet can blister.  Athlete's foot can usually be treated with over-the-counter topical antifungal products; but sometimes with chronic or extensive tinea pedis, prescription oral medications are needed.   I am recommending that you try first:Clotrimazole 1% cream or gel, apply to area twice per day BUT if this causes a breakout then I recommend taking the oral medicine as prescribed below.  Prescription medications are only indicated for an extensive rash or if over the counter treatments have failed.  I am prescribing:Fluconazole 150 mg once weekly for two to four weeks  HOME CARE:  Keep feet clean, dry, and cool. Avoid using swimming pools, public showers, or foot baths. Wear sandals when possible or air shoes out by alternating them every 2-3 days. Avoid wearing closed shoes and wearing socks made from fabric that doesn't dry easily (for example, nylon). Treat the infection with recommended medication  GET HELP RIGHT AWAY IF:  Symptoms that don't go away after treatment. Severe itching that persists. If your rash spreads or swells. If your rash begins to have drainage or smell. You develop a fever.  MAKE SURE YOU   Understand these instructions. Will watch your condition. Will get help right away if you are not doing well or get worse.   Thank you for choosing  an e-visit.  Your e-visit answers were reviewed by a board certified advanced clinical practitioner to complete your personal care plan. Depending upon the condition, your plan could have included both over the counter or prescription medications.  Please review your pharmacy choice. Make sure the pharmacy is open so you can pick up prescription now. If there is a problem, you may contact your provider through [employer] and have the prescription routed to another pharmacy.  Your safety is important to [place]. If you have drug allergies check your prescription carefully.   For the next 24 hours you can use MyChart to ask questions about today's visit, request a non-urgent call back, or ask for a work or school excuse.  You will get an email in the next two days asking about your experience. I hope that your e-visit has been valuable and will speed your recovery  References or for more information:  [email or web address]=[email or web address]&source=search_result&selectedTitle=1~104&usage_type=default&display_rank=1  [email or web address]    I have spent 5 minutes in review of e-visit questionnaire, review and updating patient chart, medical decision making and response to patient.   Gilberto Better, PA-C

## 2023-05-01 ENCOUNTER — Ambulatory Visit: Admitting: Urgent Care

## 2023-05-01 ENCOUNTER — Encounter: Payer: Self-pay | Admitting: Urgent Care

## 2023-05-01 VITALS — BP 128/86 | HR 85 | Ht 71.0 in | Wt 183.8 lb

## 2023-05-01 DIAGNOSIS — R220 Localized swelling, mass and lump, head: Secondary | ICD-10-CM

## 2023-05-01 DIAGNOSIS — R29898 Other symptoms and signs involving the musculoskeletal system: Secondary | ICD-10-CM | POA: Diagnosis not present

## 2023-05-01 DIAGNOSIS — R350 Frequency of micturition: Secondary | ICD-10-CM | POA: Diagnosis not present

## 2023-05-01 DIAGNOSIS — M2550 Pain in unspecified joint: Secondary | ICD-10-CM

## 2023-05-01 DIAGNOSIS — T7840XA Allergy, unspecified, initial encounter: Secondary | ICD-10-CM

## 2023-05-01 LAB — CBC WITH DIFFERENTIAL/PLATELET
Basophils Absolute: 0 10*3/uL (ref 0.0–0.1)
Basophils Relative: 1 % (ref 0.0–3.0)
Eosinophils Absolute: 0.4 10*3/uL (ref 0.0–0.7)
Eosinophils Relative: 8.2 % — ABNORMAL HIGH (ref 0.0–5.0)
HCT: 47.6 % (ref 39.0–52.0)
Hemoglobin: 15.6 g/dL (ref 13.0–17.0)
Lymphocytes Relative: 40.8 % (ref 12.0–46.0)
Lymphs Abs: 1.8 10*3/uL (ref 0.7–4.0)
MCHC: 32.7 g/dL (ref 30.0–36.0)
MCV: 90.3 fl (ref 78.0–100.0)
Monocytes Absolute: 0.3 10*3/uL (ref 0.1–1.0)
Monocytes Relative: 6.4 % (ref 3.0–12.0)
Neutro Abs: 2 10*3/uL (ref 1.4–7.7)
Neutrophils Relative %: 43.6 % (ref 43.0–77.0)
Platelets: 204 10*3/uL (ref 150.0–400.0)
RBC: 5.28 Mil/uL (ref 4.22–5.81)
RDW: 14.7 % (ref 11.5–15.5)
WBC: 4.5 10*3/uL (ref 4.0–10.5)

## 2023-05-01 LAB — COMPREHENSIVE METABOLIC PANEL
ALT: 7 U/L (ref 0–53)
AST: 19 U/L (ref 0–37)
Albumin: 4.8 g/dL (ref 3.5–5.2)
Alkaline Phosphatase: 86 U/L (ref 39–117)
BUN: 18 mg/dL (ref 6–23)
CO2: 30 meq/L (ref 19–32)
Calcium: 9.7 mg/dL (ref 8.4–10.5)
Chloride: 103 meq/L (ref 96–112)
Creatinine, Ser: 0.83 mg/dL (ref 0.40–1.50)
GFR: 120.06 mL/min (ref >60.00)
Glucose, Bld: 93 mg/dL (ref 70–99)
Potassium: 4.5 meq/L (ref 3.5–5.1)
Sodium: 140 meq/L (ref 135–145)
Total Bilirubin: 0.6 mg/dL (ref 0.2–1.2)
Total Protein: 7.4 g/dL (ref 6.0–8.3)

## 2023-05-01 LAB — C-REACTIVE PROTEIN: CRP: <1 mg/dL (ref 0.5–20.0)

## 2023-05-01 LAB — TSH: TSH: 2.18 u[IU]/mL (ref 0.35–5.50)

## 2023-05-01 LAB — POCT URINALYSIS DIPSTICK
Bilirubin, UA: NEGATIVE
Blood, UA: NEGATIVE
Glucose, UA: NEGATIVE
Ketones, UA: NEGATIVE
Leukocytes, UA: NEGATIVE
Nitrite, UA: NEGATIVE
Protein, UA: NEGATIVE
Spec Grav, UA: 1.02 (ref 1.010–1.025)
Urobilinogen, UA: 0.2 U/dL
pH, UA: 7 (ref 5.0–8.0)

## 2023-05-01 LAB — SEDIMENTATION RATE: Sed Rate: 16 mm/h — ABNORMAL HIGH (ref 0–15)

## 2023-05-01 LAB — B12 AND FOLATE PANEL
Folate: 7.1 ng/mL (ref >5.9)
Vitamin B-12: 412 pg/mL (ref 211–911)

## 2023-05-01 MED ORDER — CELECOXIB 200 MG PO CAPS: 200.0000 mg | ORAL_CAPSULE | Freq: Two times a day (BID) | ORAL | 0 refills | Status: DC

## 2023-05-01 NOTE — Progress Notes (Signed)
 New Patient Office Visit  Subjective:  Patient ID: Jeffrey Chang, male    DOB: 02-Aug-1995  Age: 28 y.o. MRN: [id number]  CC:  Chief Complaint  Patient presents with   Establish Care    New pt est care. He has been having swelling and pain In  his hands and feet. He was seen virtually on the 19th for a reaction to a cream which has not gotten better.    HPI Jeffrey Chang presents to establish care.  Discussed the use of AI scribe software for clinical note transcription with the patient, who gave verbal consent to proceed.  History of Present Illness   Jeffrey Chang is a 28 year old male who presents with chronic pain and swelling in the hands and tongue.  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 There is a lack of sensation in his fingers, extending from his hands to his elbows, and his hands sometimes 'lock up' during welding. This has been ongoing for an extended period, but he has been unable to see a primary care provider until now due to scheduling issues. He previously had a video visit where he was prescribed meloxicam, which provided some relief but did not fully resolve his symptoms. He has not used splints that were recommended to him.  He reports a sensation of swelling in his tongue and throat, which he associates with the use of hemorrhoid cream containing hydrocortisone. This reaction began approximately three weeks ago. He describes a tightness in his neck and occasional difficulty swallowing, though he does not experience pain when swallowing. He has a history of being prescribed prednisone for similar symptoms in the past.  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 His knees, shoulders, and hips are particularly affected, and  the pain sometimes disrupts his sleep. He has been using over-the-counter naproxen for pain relief.  He mentions frequent urination and darker urine, which he attributes to high coffee consumption and low water intake. He acknowledges the need to increase his water intake.  He has a family history of rheumatoid arthritis and is concerned about the possibility of an autoimmune condition given his symptoms.       Outpatient Encounter Medications as of 05/01/2023  Medication Sig   celecoxib (CELEBREX) 200 MG capsule Take 1 capsule (200 mg total) by mouth 2 (two) times daily with a meal.   [DISCONTINUED] meloxicam (MOBIC) 15 MG tablet Take 1 tablet (15 mg total) by mouth daily.   [DISCONTINUED] albuterol (VENTOLIN HFA) 108 (90 Base) MCG/ACT inhaler Inhale 1-2 puffs into the lungs every 6 (six) hours as needed for wheezing or shortness of breath. (Patient not taking: Reported on 05/01/2023)   [DISCONTINUED] albuterol (VENTOLIN HFA) 108 (90 Base) MCG/ACT inhaler Inhale 2 puffs into the lungs every 6 (six) hours as needed for wheezing or shortness of breath. (Patient not taking: Reported on 05/01/2023)   [DISCONTINUED] clotrimazole-betamethasone (LOTRISONE) cream Apply 1 Application topically daily. (Patient not taking: Reported on 05/01/2023)   [DISCONTINUED] cyclobenzaprine (FLEXERIL) 5 MG tablet Take 1 tablet (5 mg total) by mouth at bedtime as needed for muscle spasms. (Patient not taking: Reported on 05/01/2023)   [DISCONTINUED] dicyclomine (BENTYL) 10 MG capsule Take 1 capsule (10 mg total) by mouth 3 (three) times daily before  meals. (Patient not taking: Reported on 05/01/2023)   [DISCONTINUED] famotidine (PEPCID) 20 MG tablet Take 1 tablet (20 mg total) by mouth 2 (two) times daily for 14 days.   [DISCONTINUED] fluconazole (DIFLUCAN) 150 MG tablet Take one tablet by mouth once weekly x 4 weeks. (Patient not taking: Reported on 05/01/2023)   [DISCONTINUED] fluticasone (FLONASE) 50 MCG/ACT nasal spray  Place 2 sprays into both nostrils daily. (Patient not taking: Reported on 05/01/2023)   [DISCONTINUED] hydrocortisone (ANUSOL-HC) 25 MG suppository Place 1 suppository (25 mg total) rectally 2 (two) times daily. (Patient not taking: Reported on 05/01/2023)   [DISCONTINUED] hydrocortisone 2.5 % ointment Apply topically 2 (two) times daily. (Patient not taking: Reported on 05/01/2023)   [DISCONTINUED] hydrOXYzine (VISTARIL) 25 MG capsule Take 50 mg by mouth every 8 (eight) hours as needed. (Patient not taking: Reported on 05/01/2023)   [DISCONTINUED] hydrOXYzine (VISTARIL) 25 MG capsule Take 1 capsule (25 mg total) by mouth every 8 (eight) hours as needed for itching (will cause drowsiness). (Patient not taking: Reported on 05/01/2023)   [DISCONTINUED] ibuprofen (ADVIL) 800 MG tablet Take 1 tablet (800 mg total) by mouth every 8 (eight) hours as needed. (Patient not taking: Reported on 05/01/2023)   [DISCONTINUED] ipratropium (ATROVENT) 0.03 % nasal spray Place 2 sprays into both nostrils every 12 (twelve) hours. (Patient not taking: Reported on 05/01/2023)   [DISCONTINUED] KETOCONAZOLE, TOPICAL, 1 % SHAM Apply 1 Application topically every other day. (Patient not taking: Reported on 05/01/2023)   [DISCONTINUED] neomycin-polymyxin-hydrocortisone (CORTISPORIN) 3.5-10000-1 OTIC suspension Place 3 drops into both ears 4 (four) times daily. For 7 days (Patient not taking: Reported on 05/01/2023)   [DISCONTINUED] nystatin (MYCOSTATIN) 100000 UNIT/ML suspension Take 5 mLs (500,000 Units total) by mouth 4 (four) times daily. (Patient not taking: Reported on 05/01/2023)   [DISCONTINUED] predniSONE (DELTASONE) 20 MG tablet Take 2 tablets (40 mg total) by mouth daily with breakfast. (Patient not taking: Reported on 05/01/2023)   No facility-administered encounter medications on file as of 05/01/2023.    Past Medical History:  Diagnosis Date   Bipolar 1 disorder (HCC)    Cannabis abuse with psychotic disorder, with  delusions (HCC) 07/07/2016   Frequent headaches    Manic depressive disorder (HCC)     History reviewed. No pertinent surgical history.  Family History  Problem Relation Age of Onset   Rheum arthritis Mother    Rheum arthritis Father    Diabetes Brother     Social History   Socioeconomic History   Marital status: Single    Spouse name: Not on file   Number of children: Not on file   Years of education: Not on file   Highest education level: Not on file  Occupational History   Not on file  Tobacco Use   Smoking status: Every Day    Types: Cigars   Smokeless tobacco: Never  Vaping Use   Vaping status: Never Used  Substance and Sexual Activity   Alcohol use: Yes    Comment: Occassionally.   Drug use: Not Currently   Sexual activity: Yes    Partners: Female  Other Topics Concern   Not on file  Social History Narrative   Not on file   Social Drivers of Health   Financial Resource Strain: Not on file  Food Insecurity: Not on file  Transportation Needs: Not on file  Physical Activity: Not on file  Stress: Not on file  Social Connections: Not on file  Intimate Partner Violence: Not on file  ROS: as noted in HPI  Objective:  BP 128/86   Pulse 85   Ht 5\' 11"  (1.803 m)   Wt 183 lb 12.8 oz (83.4 kg)   SpO2 99%   BMI 25.63 kg/m   Physical Exam Vitals and nursing note reviewed.  Constitutional:      General: He is not in acute distress.    Appearance: Normal appearance. He is not ill-appearing, toxic-appearing or diaphoretic.  HENT:     Head: Normocephalic and atraumatic.     Right Ear: Tympanic membrane, ear canal and external ear normal. There is no impacted cerumen.     Left Ear: Tympanic membrane, ear canal and external ear normal. There is no impacted cerumen.     Nose: Nose normal.     Mouth/Throat:     Mouth: Mucous membranes are moist.     Pharynx: Oropharynx is clear. No oropharyngeal exudate or posterior oropharyngeal erythema.  Eyes:      General: No scleral icterus.       Right eye: No discharge.        Left eye: No discharge.     Extraocular Movements: Extraocular movements intact.     Pupils: Pupils are equal, round, and reactive to light.  Neck:     Thyroid: No thyroid mass, thyromegaly or thyroid tenderness.  Cardiovascular:     Rate and Rhythm: Normal rate and regular rhythm.     Pulses: Normal pulses.     Heart sounds: No murmur heard. Pulmonary:     Effort: Pulmonary effort is normal. No respiratory distress.     Breath sounds: Normal breath sounds. No stridor. No wheezing or rhonchi.  Abdominal:     General: Abdomen is flat. Bowel sounds are normal. There is no distension.     Palpations: Abdomen is soft. There is no mass.     Tenderness: There is no abdominal tenderness. There is no guarding.  Musculoskeletal:        General: No swelling, tenderness or deformity. Normal range of motion.     Cervical back: Normal range of motion and neck supple. No rigidity or tenderness.     Right lower leg: No edema.     Left lower leg: No edema.     Comments: Negative phalen/ tinel  Lymphadenopathy:     Cervical: No cervical adenopathy.  Skin:    General: Skin is warm and dry.     Coloration: Skin is not jaundiced.     Findings: No bruising, erythema or rash.  Neurological:     General: No focal deficit present.     Mental Status: He is alert and oriented to person, place, and time.     Sensory: No sensory deficit.     Motor: No weakness.  Psychiatric:        Mood and Affect: Mood normal.        Behavior: Behavior normal.    Lab Results  Component Value Date   COLORU yellow 05/01/2023   CLARITYU clear 05/01/2023   GLUCOSEUR Negative 05/01/2023   BILIRUBINUR negative 05/01/2023   KETONESU negative 05/01/2023   SPECGRAV 1.020 05/01/2023   RBCUR negative 05/01/2023   PHUR 7.0 05/01/2023   PROTEINUR Negative 05/01/2023   UROBILINOGEN 0.2 05/01/2023   LEUKOCYTESUR Negative 05/01/2023      Assessment &  Plan:  Multiple joint pain -     CBC with Differential/Platelet -     C-reactive protein -     Sedimentation rate -  ANA Screen,IFA,Reflex Titer/Pattern,Reflex Mplx 11 Ab Cascade with IdentRA -     Comprehensive metabolic panel -     TSH -     HLA-B27 antigen -     Celecoxib; Take 1 capsule (200 mg total) by mouth 2 (two) times daily with a meal.  Dispense: 60 capsule; Refill: 0  Neck tightness -     HLA-B27 antigen  Tongue swelling -     B12 and Folate Panel  Urinary frequency -     POCT urinalysis dipstick  Assessment and Plan    Peripheral Neuropathy Chronic numbness and swelling in hands, extending to elbows, with decreased sensation and occasional locking. 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 Possible nerve involvement. - Order autoimmune screening tests. - Prescribe Celebrex for pain management. - Advise wearing splints at night.  Joint Pain Chronic joint pain in knees, shoulders, hips, and spine. Mild scoliosis and possible early onset arthritis. Family history suggests potential autoimmune etiology. Celebrex chosen for better pain management. - Order autoimmune screening tests. - Prescribe Celebrex for pain management. - Advise taking Celebrex with food.  Neck tightness Anterior, without dysphagia. Routine TSH Ordered. Pending labs today, consider additional thyroid workup in future if sx persist  Tongue swelling This is subjective. I see no objective findings. No concerns for allergic reaction, angioedema or other noted cause. Will monitor  Frequent Urination Increased frequency of urination with occasional dark urine, likely due to high caffeine intake. - Advise increasing water intake. - Check urine sample in office - WNL  General Health Maintenance Active lifestyle with discomfort affecting daily activities. - Encourage regular hydration and balanced diet. - Advise taking medications with food.  Follow-up Plan to follow up on lab results and adjust  treatment based on findings. - Review lab results and communicate findings via MyChart. - Schedule follow-up appointment if necessary.        Return in about 2 weeks (around 05/15/2023).   Maretta Bees, PA

## 2023-05-01 NOTE — Patient Instructions (Signed)
 We drew labs on you today to assess for causes of your joint pain.  Please take the celebrex twice daily with food.  Decrease caffeine intake and switch to WATER.  Please return in two weeks for a recheck and follow up.

## 2023-05-04 LAB — HLA-B27 ANTIGEN: HLA-B27 Antigen: NEGATIVE

## 2023-05-05 MED ORDER — LEVOCETIRIZINE DIHYDROCHLORIDE 5 MG PO TABS: 5.0000 mg | ORAL_TABLET | Freq: Every evening | ORAL | 2 refills | Status: DC

## 2023-05-06 LAB — ANA SCREEN,IFA,REFLEX TITER/PATTERN,REFLEX MPLX 11 AB CASCADE
Anti Nuclear Antibody (ANA): NEGATIVE
Cyclic Citrullin Peptide Ab: <16 U
MUTATED CITRULLINATED VIMENTIN (MCV) AB: <20 U/mL (ref <20)
Rheumatoid fact SerPl-aCnc: <10 [IU]/mL (ref <14)

## 2023-05-08 ENCOUNTER — Encounter: Payer: Self-pay | Admitting: Urgent Care

## 2023-05-08 ENCOUNTER — Ambulatory Visit: Admitting: Urgent Care

## 2023-05-08 VITALS — BP 138/86 | HR 83 | Wt 187.0 lb

## 2023-05-08 DIAGNOSIS — M2550 Pain in unspecified joint: Secondary | ICD-10-CM

## 2023-05-08 DIAGNOSIS — L2082 Flexural eczema: Secondary | ICD-10-CM | POA: Diagnosis not present

## 2023-05-08 DIAGNOSIS — R898 Other abnormal findings in specimens from other organs, systems and tissues: Secondary | ICD-10-CM | POA: Diagnosis not present

## 2023-05-08 MED ORDER — EUCRISA 2 % EX OINT: 1.0000 | TOPICAL_OINTMENT | Freq: Every day | CUTANEOUS | 2 refills | Status: DC

## 2023-05-08 MED ORDER — PREDNISONE 10 MG (21) PO TBPK: ORAL_TABLET | Freq: Every day | ORAL | 0 refills | Status: DC

## 2023-05-08 NOTE — Patient Instructions (Addendum)
 Temporarily stop the celebrex. Do not take it while on the steroids.  Please start the prednisone taper pack, take with breakfast each morning until gone.  Once completed, you may restart the celebrex if desired.  For your eczema, please use Eucrisa daily.  Continue with your nightly xyzal use.  Please return in several months to complete an annual physical exam.

## 2023-05-08 NOTE — Progress Notes (Signed)
 Established Patient Office Visit  Subjective:  Patient ID: Jeffrey Chang, male    DOB: Jan 15, 1996  Age: 28 y.o. MRN: [id number]  Chief Complaint  Patient presents with   Follow-up    1-2 week follow up. He's has been having issues with skin irritation on his whole body.     HPI  Discussed the use of AI scribe software for clinical note transcription with the patient, who gave verbal consent to proceed.  History of Present Illness   Wiliam Chang is a 28 year old male who presents with joint issues and skin concerns.  He has been experiencing ongoing joint issues, initially thought to be related to the joints themselves, but now suspects it might be related to his skin. He describes a sensation of tightness around his knees and other areas, which he initially thought might be a fungal infection or related to an old burn. He experiences sharp pains and takes Celebrex twice daily with food, which has provided some improvement but not complete resolution.  He has a history of eczema, which he believes might be contributing to his current symptoms. He describes his skin as feeling tight, particularly around the joints, and mentions that certain motions are restricted due to this tightness. He recalls having bad eczema growing up and notes that his skin sometimes feels like it is burning when using certain topical treatments. He has been taking Xyzal, an over-the-counter antihistamine, once nightly for his symptoms. He has previously taken prednisone, which he associates with improvement in his symptoms, particularly when experiencing lumps in his back, which he attributes to his eczema.  No itching after showers, asthma, or significant weight loss. He reports feeling sluggish and having a lump in the neck, which is clarified to be a normal anatomical feature.  In terms of family history, both his parents have conditions related to joint issues, with his mother having carpal tunnel syndrome  and his father experiencing knee problems.  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 He spends time at home with his daughter.      There are no active problems to display for this patient.  Past Medical History:  Diagnosis Date   Bipolar 1 disorder (HCC)    Cannabis abuse with psychotic disorder, with delusions (HCC) 07/07/2016   Frequent headaches    Manic depressive disorder (HCC)    History reviewed. No pertinent surgical history. Social History   Tobacco Use   Smoking status: Every Day    Types: Cigars   Smokeless tobacco: Never  Vaping Use   Vaping status: Never Used  Substance Use Topics   Alcohol use: Yes    Comment: Occassionally.   Drug use: Not Currently      ROS: as noted in HPI  Objective:     BP (!) 135/93   Pulse 83   Wt 187 lb (84.8 kg)   SpO2 99%   BMI 26.08 kg/m  BP Readings from Last 3 Encounters:  05/08/23 138/86  05/01/23 128/86  04/20/23 133/82   Wt Readings from Last 3 Encounters:  05/08/23 187 lb (84.8 kg)  05/01/23 183 lb 12.8 oz (83.4 kg)  04/20/23 185 lb (83.9 kg)      Physical Exam Vitals and nursing note reviewed.  Constitutional:      General: He is not in acute distress.    Appearance: Normal appearance. He is not ill-appearing, toxic-appearing or diaphoretic.  HENT:  Head: Normocephalic and atraumatic.     Nose: Nose normal.     Mouth/Throat:     Mouth: Mucous membranes are moist.     Pharynx: Oropharynx is clear. No oropharyngeal exudate or posterior oropharyngeal erythema.  Eyes:     General: No scleral icterus.       Right eye: No discharge.        Left eye: No discharge.     Extraocular Movements: Extraocular movements intact.     Pupils: Pupils are equal, round, and reactive to light.  Neck:     Thyroid: No thyroid mass, thyromegaly or thyroid tenderness.  Cardiovascular:     Rate and Rhythm: Normal rate and regular rhythm.     Pulses: Normal pulses.      Heart sounds: No murmur heard. Pulmonary:     Effort: Pulmonary effort is normal. No respiratory distress.     Breath sounds: Normal breath sounds. No stridor. No wheezing or rhonchi.  Abdominal:     Tenderness: There is no guarding.  Musculoskeletal:        General: No swelling, tenderness or deformity. Normal range of motion.     Cervical back: Normal range of motion and neck supple. No rigidity or tenderness.     Right lower leg: No edema.     Left lower leg: No edema.     Comments: Negative phalen/ tinel  Lymphadenopathy:     Cervical: No cervical adenopathy.  Skin:    General: Skin is warm and dry.     Coloration: Skin is not jaundiced.     Findings: No bruising, erythema or rash.     Comments: Hyperpigmented and slightly lichenified skin noted primarily to flexural surfaces  Neurological:     General: No focal deficit present.     Mental Status: He is alert and oriented to person, place, and time.     Sensory: No sensory deficit.     Motor: No weakness.  Psychiatric:        Mood and Affect: Mood normal.        Behavior: Behavior normal.      No results found for any visits on 05/08/23.  Last CBC Lab Results  Component Value Date   WBC 4.5 05/01/2023   HGB 15.6 05/01/2023   HCT 47.6 05/01/2023   MCV 90.3 05/01/2023   MCH 29.2 07/19/2016   RDW 14.7 05/01/2023   PLT 204.0 05/01/2023   Last metabolic panel Lab Results  Component Value Date   GLUCOSE 93 05/01/2023   NA 140 05/01/2023   K 4.5 05/01/2023   CL 103 05/01/2023   CO2 30 05/01/2023   BUN 18 05/01/2023   CREATININE 0.83 05/01/2023   GFR 120.06 05/01/2023   CALCIUM 9.7 05/01/2023   PROT 7.4 05/01/2023   ALBUMIN 4.8 05/01/2023   BILITOT 0.6 05/01/2023   ALKPHOS 86 05/01/2023   AST 19 05/01/2023   ALT 7 05/01/2023   ANIONGAP 6 07/19/2016      The ASCVD Risk score (Arnett DK, et al., 2019) failed to calculate for the following reasons:   The 2019 ASCVD risk score is only valid for ages 65  to 1  Assessment & Plan:  Flexural eczema -     Eucrisa; Apply 1 Application topically daily.  Dispense: 60 g; Refill: 2  Multiple joint pain -     predniSONE; Take by mouth daily. Take 6 tabs by mouth daily  for 1 days, then 5 tabs for 1 days, then  4 tabs for 1 days, then 3 tabs for 1 days, 2 tabs for 1 days, then 1 tab by mouth daily for 1 days  Dispense: 21 tablet; Refill: 0  Eosinophil count raised  Assessment and Plan    Eczema Chronic eczema with tight skin around joints causing restricted movement. Elevated eosinophil count suggests allergic component. Sensitivity to hydrocortisone noted. Prednisone chosen for broader efficacy. - Discontinue Celebrex temporarily. - Initiate prednisone taper pack, starting with 6 pills on the first day, decreasing by one pill daily. - Prescribe Eucrisa ointment for affected areas, avoiding groin and face. - Continue Xyzal once nightly for at least one month.  Joint Pain Intermittent joint pain likely related to skin tightness from eczema. Prednisone expected to provide comprehensive response. - Monitor response to prednisone for improvement in joint pain.  Elevated Eosinophils Elevated eosinophil count likely due to allergic conditions such as eczema. No significant symptoms suggesting serious condition. - Monitor eosinophil levels and symptoms.  Family History of Rheumatoid Arthritis Family history of rheumatoid arthritis. No current symptoms consistent with rheumatoid arthritis. - Monitor for development of symptoms suggestive of rheumatoid arthritis. - Autoimmune markers negative  Follow-up Assess response to prednisone and determine further management based on symptom resolution. - Report if symptoms improve significantly on prednisone and return after discontinuation. - Consider referral to rheumatology if symptoms persist or worsen.         Return in about 6 months (around 11/08/2023) for Annual Physical.   Maretta Bees, PA

## 2023-05-12 ENCOUNTER — Telehealth: Payer: Self-pay | Admitting: Physician Assistant

## 2023-05-12 ENCOUNTER — Telehealth: Payer: Self-pay

## 2023-05-12 DIAGNOSIS — F419 Anxiety disorder, unspecified: Secondary | ICD-10-CM | POA: Diagnosis not present

## 2023-05-12 MED ORDER — HYDROXYZINE PAMOATE 25 MG PO CAPS: 25.0000 mg | ORAL_CAPSULE | Freq: Three times a day (TID) | ORAL | 0 refills | Status: DC | PRN

## 2023-05-12 NOTE — Patient Instructions (Addendum)
 [name], thank you for joining [name], PA-C for today's virtual visit.  While this provider is not your primary care provider (PCP), if your PCP is located in our provider database this encounter information will be shared with them immediately following your visit.   A [facility] MyChart account gives you access to today's visit and all your visits, tests, and labs performed at [facility] " click here if you don't have a [facility] MyChart account or go to [email or web address]  Consent: (Patient) Valin Massie provided verbal consent for this virtual visit at the beginning of the encounter.  Current Medications:  Current Outpatient Medications:    hydrOXYzine (VISTARIL) 25 MG capsule, Take 1 capsule (25 mg total) by mouth every 8 (eight) hours as needed., Disp: 30 capsule, Rfl: 0   celecoxib (CELEBREX) 200 MG capsule, Take 1 capsule (200 mg total) by mouth 2 (two) times daily with a meal., Disp: 60 capsule, Rfl: 0   Crisaborole (EUCRISA) 2 % OINT, Apply 1 Application topically daily., Disp: 60 g, Rfl: 2   levocetirizine (XYZAL) 5 MG tablet, Take 1 tablet (5 mg total) by mouth every evening., Disp: 30 tablet, Rfl: 2   predniSONE (STERAPRED UNI-PAK 21 TAB) 10 MG (21) TBPK tablet, Take by mouth daily. Take 6 tabs by mouth daily  for 1 days, then 5 tabs for 1 days, then 4 tabs for 1 days, then 3 tabs for 1 days, 2 tabs for 1 days, then 1 tab by mouth daily for 1 days, Disp: 21 tablet, Rfl: 0   Medications ordered in this encounter:  Meds ordered this encounter  Medications   hydrOXYzine (VISTARIL) 25 MG capsule    Sig: Take 1 capsule (25 mg total) by mouth every 8 (eight) hours as needed.    Dispense:  30 capsule    Refill:  0    Supervising Provider:   Merrilee Jansky [[id number]]     *If you need refills on other medications prior to your next appointment, please contact your pharmacy*  Follow-Up: Call back or seek an in-person evaluation if the  symptoms worsen or if the condition fails to improve as anticipated.  [facility] [phone]  Other Instructions Please download the calm app to use for mindfulness training to help with anxiety levels. Please review the sleep hygiene practices below.  See the list of resources below for counseling. Follow-up with your PCP or at the [facility] Urgent care at [address] [phone].  Sleep Hygiene  Do: (1) Go to bed at the same time each day. (2) Get up from bed at the same time each day. (3) Get regular exercise each day, preferably in the morning.  There is goof evidence that regular exercise improves restful sleep.  This includes stretching and aerobic exercise. (4) Get regular exposure to outdoor or bright lights, especially in the late afternoon. (5) Keep the temperature in your bedroom comfortable. (6) Keep the bedroom quiet when sleeping. (7) Keep the bedroom dark enough to facilitate sleep. (8) Use your bed only for sleep and sex. (9) Take medications as directed.  It is helpful to take prescribed sleeping pills 1 hour before bedtime, so they are causing drowsiness when you lie down, or 10 hours before getting up, to avoid daytime drowsiness. (10) Use a relaxation exercise just before going to sleep -- imagery, massage, warm bath. (11) Keep your feet and hands warm.  Wear warm socks and/or mittens or gloves to  bed.  Don't: (1) Exercise just before going to bed. (2) Engage in stimulating activity just before bed, such as playing a competitive game, watching an exciting program on television, or having an important discussion with a loved one. (3) Have caffeine in the evening (coffee, teas, chocolate, sodas, etc.) (4) Read or watch television in bed. (5) Use alcohol to help you sleep. (6) Go to bed too hungry or too full. (7) Take another person's sleeping pills. (8) Take over-the-counter sleeping pills, without your doctor's knowledge.  Tolerance  can develop rapidly with these medications.  Diphenhydramine can have serious side effects for elderly patients. (9) Take daytime naps. (10) Command yourself to go to sleep.  This only makes your mind and body more alert.  If you lie awake for more than 20-30 minutes, get up, go to a different room, participate in a quiet activity (Ex - non-excitable reading or television), and then return to bed when you feel sleepy.  Do this as many times during the night as needed.  This may cause you to have a night or two of poor sleep but it will train your brain to know when it is time for sleep.   Mental Health Resources  Emergency Numbers:  47 Suicide and Crisis Lifeline (Formerly National Suicide Psychologist, sport and exercise)  O Call or SMS text 988  O For Deaf or Hard-of-Hearing-- Use preferred relay service or dial 711 and then  988  O For Spanish-speaking patients -- I -[phone]  [employer] or SMS text ([phone])801-6831  Disaster Distress Karoline Caldwell Call,Text [phone]  [employer] Violence Hotline  O call (778) 448-1618 or Text START to 503-337-8820  National Sexual Assault Hotline  o call (812)157-0709      If you have been instructed to have an in-person evaluation today at a local Urgent Care facility, please use the link below. It will take you to a list of all of our available [facility], including address, phone number and hours of operation. Please do not delay care.  [facility]  If you or a family member do not have a primary care provider, use the link below to schedule a visit and establish care. When you choose a [facility] primary care physician or advanced practice provider, you gain a long-term partner in health. Find a Primary Care Provider  Learn more about [facility] in-office and virtual care options: [facility] - Get Care Now

## 2023-05-12 NOTE — Progress Notes (Signed)
 Virtual Visit Consent   Jeffrey Chang, you are scheduled for a virtual visit with a [facility] provider today. Just as with appointments in the office, your consent must be obtained to participate. Your consent will be active for this visit and any virtual visit you may have with one of our providers in the next 365 days. If you have a MyChart account, a copy of this consent can be sent to you electronically.  As this is a virtual visit, video technology does not allow for your provider to perform a traditional examination. This may limit your provider's ability to fully assess your condition. If your provider identifies any concerns that need to be evaluated in person or the need to arrange testing (such as labs, EKG, etc.), we will make arrangements to do so. Although advances in technology are sophisticated, we cannot ensure that it will always work on either your end or our end. If the connection with a video visit is poor, the visit may have to be switched to a telephone visit. With either a video or telephone visit, we are not always able to ensure that we have a secure connection.  By engaging in this virtual visit, you consent to the provision of healthcare and authorize for your insurance to be billed (if applicable) for the services provided during this visit. Depending on your insurance coverage, you may receive a charge related to this service.  I need to obtain your verbal consent now. Are you willing to proceed with your visit today? Cory Kitt has provided verbal consent on 05/12/2023 for a virtual visit (video or telephone). Jeffrey Chang, [place]  Date: 05/12/2023 4:48 PM   Virtual Visit via Video Note   I, Jeffrey Chang, connected with  Jeffrey Chang  ([id number], [birth year]) on 05/12/23 at  4:00 PM EDT by a video-enabled telemedicine application and verified that I am speaking with the correct person using two identifiers.  Location: Patient: Virtual Visit Location  Patient: Home Provider: Virtual Visit Location Provider: Home Office   I discussed the limitations of evaluation and management by telemedicine and the availability of in person appointments. The patient expressed understanding and agreed to proceed.    History of Present Illness: Jeffrey Chang is a 28 y.o. who identifies as a male who was assigned male at birth, and is being seen today for increased levels of anxiety, worse over the past 2-3 weeks. Notes some increased nervousness with simple things. Notes history of mild anxiety but never been on anything. Some depressed mood with anhedonia after losing job about a month ago. Notes has affected appetite and sleep. Denies SI/HI.    HPI: HPI  Problems: There are no active problems to display for this patient.   Allergies:  Allergies  Allergen Reactions   Latex Itching    Other Reaction(s): Other (See Comments)  REDNESS   Anusol-Hc [Hydrocortisone] Itching and Rash   Medications:  Current Outpatient Medications:    hydrOXYzine (VISTARIL) 25 MG capsule, Take 1 capsule (25 mg total) by mouth every 8 (eight) hours as needed., Disp: 30 capsule, Rfl: 0   celecoxib (CELEBREX) 200 MG capsule, Take 1 capsule (200 mg total) by mouth 2 (two) times daily with a meal., Disp: 60 capsule, Rfl: 0   Crisaborole (EUCRISA) 2 % OINT, Apply 1 Application topically daily., Disp: 60 g, Rfl: 2   levocetirizine (XYZAL) 5 MG tablet, Take 1 tablet (5 mg total) by mouth every evening., Disp: 30 tablet, Rfl: 2  predniSONE (STERAPRED UNI-PAK 21 TAB) 10 MG (21) TBPK tablet, Take by mouth daily. Take 6 tabs by mouth daily  for 1 days, then 5 tabs for 1 days, then 4 tabs for 1 days, then 3 tabs for 1 days, 2 tabs for 1 days, then 1 tab by mouth daily for 1 days, Disp: 21 tablet, Rfl: 0  Observations/Objective: Patient is well-developed, well-nourished in no acute distress.  Resting comfortably at home.  Head is normocephalic, atraumatic.  No labored  breathing. Speech is clear and coherent with logical content.  Patient is alert and oriented at baseline.   Assessment and Plan: 1. Acute anxiety (Primary) - hydrOXYzine (VISTARIL) 25 MG capsule; Take 1 capsule (25 mg total) by mouth every 8 (eight) hours as needed.  Dispense: 30 capsule; Refill: 0  Most likely an acute stress reaction giving recent job loss.  Some mild depressed mood without anhedonia. Discussed possible need for SSRI but that we cannot start long-term medication giving we are not following his care. Discussed supportive measures use of the Calm app to help with mindfulness training. Resources included for mental health. Will start trial of Hydroxyzine for acute anxiety and sleep. Sleep hygiene practices reviewed.    Follow Up Instructions: I discussed the assessment and treatment plan with the patient. The patient was provided an opportunity to ask questions and all were answered. The patient agreed with the plan and demonstrated an understanding of the instructions.  A copy of instructions were sent to the patient via MyChart unless otherwise noted below.   The patient was advised to call back or seek an in-person evaluation if the symptoms worsen or if the condition fails to improve as anticipated.    Jeffrey Climes, PA-C

## 2023-05-12 NOTE — Telephone Encounter (Signed)
 Can you please notify pt that this type of questions requires a visit - a virtual visit is fine. I'm sorry he forgot to discuss at last office visit, but we cannot discuss anxiety management through a mychart message. Thanks

## 2023-05-13 ENCOUNTER — Ambulatory Visit: Payer: Managed Care, Other (non HMO) | Admitting: Internal Medicine

## 2023-05-13 ENCOUNTER — Telehealth (INDEPENDENT_AMBULATORY_CARE_PROVIDER_SITE_OTHER): Payer: Self-pay | Admitting: Urgent Care

## 2023-05-13 VITALS — Ht 71.0 in | Wt 187.0 lb

## 2023-05-13 DIAGNOSIS — F319 Bipolar disorder, unspecified: Secondary | ICD-10-CM

## 2023-05-13 DIAGNOSIS — F419 Anxiety disorder, unspecified: Secondary | ICD-10-CM

## 2023-05-13 DIAGNOSIS — F32A Depression, unspecified: Secondary | ICD-10-CM

## 2023-05-13 MED ORDER — BUPROPION HCL ER (XL) 150 MG PO TB24: 150.0000 mg | ORAL_TABLET | Freq: Every day | ORAL | 5 refills | Status: DC

## 2023-05-14 ENCOUNTER — Encounter: Payer: Self-pay | Admitting: Urgent Care

## 2023-05-14 DIAGNOSIS — F319 Bipolar disorder, unspecified: Secondary | ICD-10-CM | POA: Insufficient documentation

## 2023-05-14 NOTE — Assessment & Plan Note (Signed)
  1. Anxiety and depression - buPROPion (WELLBUTRIN XL) 150 MG 24 hr tablet; Take 1 tablet (150 mg total) by mouth daily.  2. Bipolar 1 disorder (HCC)

## 2023-05-14 NOTE — Progress Notes (Signed)
 Virtual Visit via Video Note  I connected with [name] on 05/14/23 at 10:40 AM EDT by a video enabled telemedicine application and verified that I am speaking with the correct person using two identifiers.  Patient Location: Other:  car Provider Location: Office/Clinic  I discussed the limitations, risks, security, and privacy concerns of performing an evaluation and management service by video and the availability of in person appointments. I also discussed with the patient that there may be a patient responsible charge related to this service. The patient expressed understanding and agreed to proceed.   Subjective  PCP: Maretta Bees, PA  Chief Complaint  Patient presents with   Anxiety    Virtual- pt is having issues with anxiety. Pt had an e visit yesterday and was prescribed Hydroxyzine. He has not started this medication yet because he used it in the past and it didn't help. He wanted to discuss other options.    HPI: 28 year old male with hx of bipolar disorder who presents with anxiety and depressive symptoms.  He has been experiencing anxiety and depressive symptoms for the past few months, with fluctuations between good and bad days. On bad days, he feels down, stays at home, and avoids activities, which is a change from his behavior a year ago. He feels down and depressed about five days a week, with occasional days where he can engage in activities. He denies any experiences or episodes of feeling elated or abnormally happy.  He has a history of bipolar disorder diagnosed at age 89 or 40. Previous treatments included Ativan, hydroxyzine, and Haldol, which he discontinued due to side effects like sedation and dizziness.  He experiences panic attacks characterized by a racing heart, which he attempts to manage by taking showers, though this provides limited relief. No thoughts of self-harm or harm to others. Pt denies auditory or visual hallucinations.  He reports  poor sleep, feeling like he has to force himself to sleep, and a lack of appetite, often having to force himself to eat. He lives with others who try to be supportive, but he describes himself as stubborn. No hallucinations or delusions.       ROS: Per HPI. All other pertinent systems are negative.   Current Outpatient Medications:    buPROPion (WELLBUTRIN XL) 150 MG 24 hr tablet, Take 1 tablet (150 mg total) by mouth daily., Disp: 30 tablet, Rfl: 5   celecoxib (CELEBREX) 200 MG capsule, Take 1 capsule (200 mg total) by mouth 2 (two) times daily with a meal., Disp: 60 capsule, Rfl: 0   Crisaborole (EUCRISA) 2 % OINT, Apply 1 Application topically daily., Disp: 60 g, Rfl: 2   levocetirizine (XYZAL) 5 MG tablet, Take 1 tablet (5 mg total) by mouth every evening., Disp: 30 tablet, Rfl: 2   predniSONE (STERAPRED UNI-PAK 21 TAB) 10 MG (21) TBPK tablet, Take by mouth daily. Take 6 tabs by mouth daily  for 1 days, then 5 tabs for 1 days, then 4 tabs for 1 days, then 3 tabs for 1 days, 2 tabs for 1 days, then 1 tab by mouth daily for 1 days, Disp: 21 tablet, Rfl: 0   hydrOXYzine (VISTARIL) 25 MG capsule, Take 1 capsule (25 mg total) by mouth every 8 (eight) hours as needed. (Patient not taking: Reported on 05/13/2023), Disp: 30 capsule, Rfl: 0    Objective  Vital signs not able to be obtained due to this being a virtual visit.  Physical Exam Vitals and  nursing note reviewed.  Constitutional:      General: He is not in acute distress.    Appearance: Normal appearance. He is normal weight. He is not ill-appearing, toxic-appearing or diaphoretic.  HENT:     Head: Normocephalic and atraumatic.  Eyes:     General: No scleral icterus.       Right eye: No discharge.        Left eye: No discharge.  Pulmonary:     Effort: Pulmonary effort is normal. No respiratory distress.  Skin:    Findings: No rash.  Neurological:     General: No focal deficit present.     Mental Status: He is alert and oriented  to person, place, and time.  Psychiatric:        Attention and Perception: Attention and perception normal.        Mood and Affect: Mood normal.        Speech: Speech normal.        Behavior: Behavior normal.        Cognition and Memory: Cognition normal.        Judgment: Judgment normal.      Assessment & Plan Anxiety and depression  1. Anxiety and depression - buPROPion (WELLBUTRIN XL) 150 MG 24 hr tablet; Take 1 tablet (150 mg total) by mouth daily.  2. Bipolar 1 disorder (HCC)  Bipolar 1 disorder (HCC)  1. Anxiety and depression - buPROPion (WELLBUTRIN XL) 150 MG 24 hr tablet; Take 1 tablet (150 mg total) by mouth daily.  2. Bipolar 1 disorder (HCC)  Assessment and Plan    Anxiety and Depression Persistent anxiety and depressive symptoms with history of poorly tolerated medications. Bupropion recommended for non-sedative management. - Prescribe bupropion daily. - Follow-up in 3-4 weeks to evaluate efficacy and adjust. - consider latuda if sx do not improve upon follow up - Advise urgent care for severe symptoms or suicidal ideation.     Bipolar 1 D/O Noted on chart in previous health history. He denies any current episodes of mania.    Return in about 4 weeks (around 06/10/2023).   I discussed the assessment and treatment plan with the patient. The patient was provided an opportunity to ask questions, and all were answered. The patient agreed with the plan and demonstrated an understanding of the instructions.   The patient was advised to call back or seek an in-person evaluation if the symptoms worsen or if the condition fails to improve as anticipated.  The above assessment and management plan was discussed with the patient. The patient verbalized understanding of and has agreed to the management plan.   Maretta Bees, PA

## 2023-05-14 NOTE — Patient Instructions (Signed)
 Start Wellbutrin 150mg  XL daily. Return in one month for follow up/ recheck.  If any thoughts of self harm or any urgent mental health needs,  Go to [facility] for any emergent psychiatric needs [address] [place], [place] [address] [phone]

## 2023-05-16 ENCOUNTER — Telehealth: Payer: Self-pay | Admitting: Nurse Practitioner

## 2023-05-28 ENCOUNTER — Telehealth: Payer: Self-pay | Admitting: Family Medicine

## 2023-05-28 DIAGNOSIS — H5789 Other specified disorders of eye and adnexa: Secondary | ICD-10-CM

## 2023-05-28 DIAGNOSIS — H539 Unspecified visual disturbance: Secondary | ICD-10-CM

## 2023-05-28 NOTE — Progress Notes (Signed)
 [place]   Reports vision changes with possible fungal rash around the yes. Needs in person assessment.  Patient acknowledged agreement and understanding of the plan.

## 2023-05-29 ENCOUNTER — Telehealth: Payer: Self-pay

## 2023-05-29 ENCOUNTER — Other Ambulatory Visit: Payer: Self-pay | Admitting: Nurse Practitioner

## 2023-05-29 ENCOUNTER — Ambulatory Visit
Admission: RE | Admit: 2023-05-29 | Discharge: 2023-05-29 | Discharge status: Against Medical Advice | Payer: Self-pay | Source: Ambulatory Visit | Attending: Urgent Care | Admitting: Urgent Care

## 2023-05-29 ENCOUNTER — Ambulatory Visit
Admission: RE | Admit: 2023-05-29 | Discharge: 2023-05-29 | Disposition: A | Discharge status: Home / Self Care | Payer: Self-pay | Source: Ambulatory Visit

## 2023-05-29 DIAGNOSIS — T7840XA Allergy, unspecified, initial encounter: Secondary | ICD-10-CM

## 2023-05-30 NOTE — Progress Notes (Signed)
 Telehealth provider is requesting an in office visit. Please call to schedule

## 2023-06-05 ENCOUNTER — Telehealth: Payer: Self-pay

## 2023-06-05 ENCOUNTER — Ambulatory Visit: Payer: Self-pay

## 2023-06-05 DIAGNOSIS — K047 Periapical abscess without sinus: Secondary | ICD-10-CM

## 2023-06-05 MED ORDER — IBUPROFEN 800 MG PO TABS: 800.0000 mg | ORAL_TABLET | Freq: Three times a day (TID) | ORAL | 0 refills | Status: DC | PRN

## 2023-06-05 MED ORDER — CLINDAMYCIN HCL 300 MG PO CAPS: 300.0000 mg | ORAL_CAPSULE | Freq: Three times a day (TID) | ORAL | 0 refills | Status: DC

## 2023-06-05 NOTE — Patient Instructions (Signed)
 [name], thank you for joining [name], PA-C for today's virtual visit.  While this provider is not your primary care provider (PCP), if your PCP is located in our provider database this encounter information will be shared with them immediately following your visit.   A [facility] MyChart account gives you access to today's visit and all your visits, tests, and labs performed at [facility] " click here if you don't have a [facility] MyChart account or go to [email or web address]  Consent: (Patient) Jeffrey Chang provided verbal consent for this virtual visit at the beginning of the encounter.  Current Medications:  Current Outpatient Medications:    clindamycin (CLEOCIN) 300 MG capsule, Take 1 capsule (300 mg total) by mouth 3 (three) times daily., Disp: 21 capsule, Rfl: 0   ibuprofen  (ADVIL ) 800 MG tablet, Take 1 tablet (800 mg total) by mouth every 8 (eight) hours as needed., Disp: 30 tablet, Rfl: 0   buPROPion  (WELLBUTRIN  XL) 150 MG 24 hr tablet, Take 1 tablet (150 mg total) by mouth daily., Disp: 30 tablet, Rfl: 5   celecoxib  (CELEBREX ) 200 MG capsule, Take 1 capsule (200 mg total) by mouth 2 (two) times daily with a meal., Disp: 60 capsule, Rfl: 0   Crisaborole  (EUCRISA ) 2 % OINT, Apply 1 Application topically daily., Disp: 60 g, Rfl: 2   hydrOXYzine  (VISTARIL ) 25 MG capsule, Take 1 capsule (25 mg total) by mouth every 8 (eight) hours as needed. (Patient not taking: Reported on 05/13/2023), Disp: 30 capsule, Rfl: 0   levocetirizine (XYZAL ) 5 MG tablet, Take 1 tablet (5 mg total) by mouth every evening., Disp: 30 tablet, Rfl: 2   predniSONE  (STERAPRED UNI-PAK 21 TAB) 10 MG (21) TBPK tablet, Take by mouth daily. Take 6 tabs by mouth daily  for 1 days, then 5 tabs for 1 days, then 4 tabs for 1 days, then 3 tabs for 1 days, 2 tabs for 1 days, then 1 tab by mouth daily for 1 days, Disp: 21 tablet, Rfl: 0   Medications ordered in this encounter:  Meds  ordered this encounter  Medications   clindamycin (CLEOCIN) 300 MG capsule    Sig: Take 1 capsule (300 mg total) by mouth 3 (three) times daily.    Dispense:  21 capsule    Refill:  0    Supervising Provider:   LAMPTEY, PHILIP O [[id number]]   ibuprofen  (ADVIL ) 800 MG tablet    Sig: Take 1 tablet (800 mg total) by mouth every 8 (eight) hours as needed.    Dispense:  30 tablet    Refill:  0    Supervising Provider:   LAMPTEY, PHILIP O [[id number]]     *If you need refills on other medications prior to your next appointment, please contact your pharmacy*  Follow-Up: Call back or seek an in-person evaluation if the symptoms worsen or if the condition fails to improve as anticipated.  [facility] [phone]  Other Instructions Dental Abscess  A dental abscess is an infection around a tooth that may involve pain, swelling, and a collection of pus, as well as other symptoms. Treatment is important to help with symptoms and to prevent the infection from spreading. The general types of dental abscesses are: Pulpal abscess. This abscess may form from the inner part of the tooth (pulp). Periodontal abscess. This abscess may form from the gum. What are the causes? This condition is caused by a bacterial infection in or around the tooth. It may  result from: Severe tooth decay (cavities). Trauma to the tooth, such as a broken or chipped tooth. What increases the risk? This condition is more likely to develop in males. It is also more likely to develop in people who: Have cavities. Have severe gum disease. Eat sugary snacks between meals. Use tobacco products. Have diabetes. Have a weakened disease-fighting system (immune system). Do not brush and care for their teeth regularly. What are the signs or symptoms? Mild symptoms of this condition include: Tenderness. Bad breath. Fever. A bitter taste in the mouth. Pain in and around the infected tooth. Moderate symptoms of  this condition include: Swollen neck glands. Chills. Pus drainage. Swelling and redness around the infected tooth, in the mouth, or in the face. Severe pain in and around the infected tooth. Severe symptoms of this condition include: Difficulty swallowing. Difficulty opening the mouth. Nausea. Vomiting. How is this diagnosed? This condition is diagnosed based on: Your symptoms and your medical and dental history. An examination of the infected tooth. During the exam, your dental care provider may tap on the infected tooth. You may also need to have X-rays taken of the affected area. How is this treated? This condition is treated by getting rid of the infection. This may be done with: Antibiotic medicines. These may be used in certain situations. Antibacterial mouth rinse. Incision and drainage. This procedure is done by making an incision in the abscess to drain out the pus. Removing pus is the first priority in treating an abscess. A root canal. This may be performed to save the tooth. Your dental care provider accesses the visible part of your tooth (crown) with a drill and removes any infected pulp. Then the space is filled and sealed off. Tooth extraction. The tooth is pulled out if it cannot be saved by other treatment. You may also receive treatment for pain, such as: Acetaminophen  or NSAIDs. Gels that contain a numbing medicine. An injection to block the pain near your nerve. Follow these instructions at home: Medicines Take over-the-counter and prescription medicines only as told by your dental care provider. If you were prescribed an antibiotic, take it as told by your dental care provider. Do not stop taking the antibiotic even if you start to feel better. If you were prescribed a gel that contains a numbing medicine, use it exactly as told in the directions. Do not use these gels for children who are younger than 67 years of age. Use an antibacterial mouth rinse as told by  your dental care provider. General instructions  Gargle with a mixture of salt and water 3-4 times a day or as needed. To make salt water, completely dissolve -1 tsp (3-6 g) of salt in 1 cup (237 mL) of warm water. Eat a soft diet while your abscess is healing. Drink enough fluid to keep your urine pale yellow. Do not apply heat to the outside of your mouth. Do not use any products that contain nicotine  or tobacco. These products include cigarettes, chewing tobacco, and vaping devices, such as e-cigarettes. If you need help quitting, ask your dental care provider. Keep all follow-up visits. This is important. How is this prevented?  Excellent dental home care, which includes brushing your teeth every morning and night with fluoride toothpaste. Floss one time each day. Get regularly scheduled dental cleanings. Consider having a dental sealant applied on teeth that have deep grooves to prevent cavities. Drink fluoridated water regularly. This includes most tap water. Check the label on bottled water  to see if it contains fluoride. Reduce or eliminate sugary drinks. Eat healthy meals and snacks. Wear a mouth guard or face shield to protect your teeth while playing sports. Contact a health care provider if: Your pain is worse and is not helped by medicine. You have swelling. You see pus around the tooth. You have a fever or chills. Get help right away if: Your symptoms suddenly get worse. You have a very bad headache. You have problems breathing or swallowing. You have trouble opening your mouth. You have swelling in your neck or around your eye. These symptoms may represent a serious problem that is an emergency. Do not wait to see if the symptoms will go away. Get medical help right away. Call your local emergency services (911 in the [place] Do not drive yourself to the hospital. Summary A dental abscess is a collection of pus in or around a tooth that results from an infection. A  dental abscess may result from severe tooth decay, trauma to the tooth, or severe gum disease around a tooth. Symptoms include severe pain, swelling, redness, and drainage of pus in and around the infected tooth. The first priority in treating a dental abscess is to drain out the pus. Treatment may also involve removing damage inside the tooth (root canal) or extracting the tooth. This information is not intended to replace advice given to you by your health care provider. Make sure you discuss any questions you have with your health care provider. Document Revised: 04/05/2020 Document Reviewed: 04/05/2020 Elsevier Patient Education  2024 Elsevier Inc.   If you have been instructed to have an in-person evaluation today at a local Urgent Care facility, please use the link below. It will take you to a list of all of our available [facility], including address, phone number and hours of operation. Please do not delay care.  [facility]  If you or a family member do not have a primary care provider, use the link below to schedule a visit and establish care. When you choose a [facility] primary care physician or advanced practice provider, you gain a long-term partner in health. Find a Primary Care Provider  Learn more about [facility] in-office and virtual care options: [facility] Get Care Now

## 2023-06-05 NOTE — Progress Notes (Signed)
 Virtual Visit Consent   Jeffrey Chang, you are scheduled for a virtual visit with a [facility] provider today. Just as with appointments in the office, your consent must be obtained to participate. Your consent will be active for this visit and any virtual visit you may have with one of our providers in the next 365 days. If you have a MyChart account, a copy of this consent can be sent to you electronically.  As this is a virtual visit, video technology does not allow for your provider to perform a traditional examination. This may limit your provider's ability to fully assess your condition. If your provider identifies any concerns that need to be evaluated in person or the need to arrange testing (such as labs, EKG, etc.), we will make arrangements to do so. Although advances in technology are sophisticated, we cannot ensure that it will always work on either your end or our end. If the connection with a video visit is poor, the visit may have to be switched to a telephone visit. With either a video or telephone visit, we are not always able to ensure that we have a secure connection.  By engaging in this virtual visit, you consent to the provision of healthcare and authorize for your insurance to be billed (if applicable) for the services provided during this visit. Depending on your insurance coverage, you may receive a charge related to this service.  I need to obtain your verbal consent now. Are you willing to proceed with your visit today? [name] has provided verbal consent on 06/05/2023 for a virtual visit (video or telephone). [name], PA-C  Date: 06/05/2023 8:15 AM   Virtual Visit via Video Note   I, [name], connected with  Jeffrey Chang  ([id number], 17-Dec-2001) on 06/05/23 at  8:15 AM EDT by a video-enabled telemedicine application and verified that I am speaking with the correct person using two identifiers.  Location: Patient: Virtual Visit  Location Patient: Home Provider: Virtual Visit Location Provider: Home Office   I discussed the limitations of evaluation and management by telemedicine and the availability of in person appointments. The patient expressed understanding and agreed to proceed.    History of Present Illness: Jeffrey Chang is a 28 y.o. who identifies as a male who was assigned male at birth, and is being seen today for dental issue and jaw pain.  HPI: Dental Pain  This is a new problem. The current episode started in the past 7 days (noticed a pimple on the back of his gums, causing pain radiating into the left jaw). The problem occurs constantly. The problem has been gradually worsening. The pain is mild. Associated symptoms include facial pain (jaw pain), a fever (subjective) and sinus pressure. Pertinent negatives include no difficulty swallowing, oral bleeding or thermal sensitivity. He has tried acetaminophen  (numbing mouth drop) for the symptoms. The treatment provided no relief.     Problems:  Patient Active Problem List   Diagnosis Date Noted   Bipolar 1 disorder (HCC)     Allergies:  Allergies  Allergen Reactions   Latex Itching    Other Reaction(s): Other (See Comments)  REDNESS   Anusol -Hc [Hydrocortisone ] Itching and Rash   Medications:  Current Outpatient Medications:    clindamycin  (CLEOCIN ) 300 MG capsule, Take 1 capsule (300 mg total) by mouth 3 (three) times daily., Disp: 21 capsule, Rfl: 0   ibuprofen  (ADVIL ) 800 MG tablet, Take 1 tablet (800 mg total) by mouth every 8 (eight)  hours as needed., Disp: 30 tablet, Rfl: 0   buPROPion  (WELLBUTRIN  XL) 150 MG 24 hr tablet, Take 1 tablet (150 mg total) by mouth daily., Disp: 30 tablet, Rfl: 5   celecoxib  (CELEBREX ) 200 MG capsule, Take 1 capsule (200 mg total) by mouth 2 (two) times daily with a meal., Disp: 60 capsule, Rfl: 0   Crisaborole  (EUCRISA ) 2 % OINT, Apply 1 Application topically daily., Disp: 60 g, Rfl: 2   hydrOXYzine  (VISTARIL )  25 MG capsule, Take 1 capsule (25 mg total) by mouth every 8 (eight) hours as needed. (Patient not taking: Reported on 05/13/2023), Disp: 30 capsule, Rfl: 0   levocetirizine (XYZAL ) 5 MG tablet, Take 1 tablet (5 mg total) by mouth every evening., Disp: 30 tablet, Rfl: 2   predniSONE  (STERAPRED UNI-PAK 21 TAB) 10 MG (21) TBPK tablet, Take by mouth daily. Take 6 tabs by mouth daily  for 1 days, then 5 tabs for 1 days, then 4 tabs for 1 days, then 3 tabs for 1 days, 2 tabs for 1 days, then 1 tab by mouth daily for 1 days, Disp: 21 tablet, Rfl: 0  Observations/Objective: Patient is well-developed, well-nourished in no acute distress.  Resting comfortably at home.  Head is normocephalic, atraumatic.  No labored breathing.  Speech is clear and coherent with logical content.  Patient is alert and oriented at baseline.    Assessment and Plan: 1. Dental infection (Primary) - clindamycin (CLEOCIN) 300 MG capsule; Take 1 capsule (300 mg total) by mouth 3 (three) times daily.  Dispense: 21 capsule; Refill: 0 - ibuprofen  (ADVIL ) 800 MG tablet; Take 1 tablet (800 mg total) by mouth every 8 (eight) hours as needed.  Dispense: 30 tablet; Refill: 0  - Suspected infection since patient has a visible to him "whitehead" on his gumline - Clindamycin and ibuprofen  prescribed - Can use ice on outside jaw/cheek for swelling - Can also take tylenol  for pain with other medications - Discussed DenTemp putty that can be used to cover a broken tooth - Schedule a follow with a dentist as soon as possible (Can contact [facility] or [facility] [phone]] associated with [facility] health department if underinsured or uninsured) - Seek in person evaluation if symptoms fail to improve or if they worsen   Follow Up Instructions: I discussed the assessment and treatment plan with the patient. The patient was provided an opportunity to ask questions and all were answered. The patient agreed  with the plan and demonstrated an understanding of the instructions.  A copy of instructions were sent to the patient via MyChart unless otherwise noted below.    The patient was advised to call back or seek an in-person evaluation if the symptoms worsen or if the condition fails to improve as anticipated.    [name], PA-C

## 2023-06-12 ENCOUNTER — Telehealth: Admitting: Family Medicine

## 2023-06-12 ENCOUNTER — Encounter

## 2023-06-12 DIAGNOSIS — B370 Candidal stomatitis: Secondary | ICD-10-CM

## 2023-06-12 MED ORDER — NYSTATIN 100000 UNIT/ML MT SUSP: 5.0000 mL | Freq: Four times a day (QID) | OROMUCOSAL | 0 refills | Status: AC

## 2023-06-12 NOTE — Progress Notes (Signed)
 E Visit for Rash  We are sorry that you are not feeling well. Here is how we plan to help!  Based upon your presentation it appears you have a fungal infection.  I have prescribed: a medication to swish and swallow for thrush.     GET HELP RIGHT AWAY IF:  Symptoms don't go away after treatment. Severe itching that persists. If you rash spreads or swells. If you rash begins to smell. If it blisters and opens or develops a yellow-brown crust. You develop a fever. You have a sore throat. You become short of breath.  MAKE SURE YOU:  Understand these instructions. Will watch your condition. Will get help right away if you are not doing well or get worse.  Thank you for choosing an e-visit.  Your e-visit answers were reviewed by a board certified advanced clinical practitioner to complete your personal care plan. Depending upon the condition, your plan could have included both over the counter or prescription medications.  Please review your pharmacy choice. Make sure the pharmacy is open so you can pick up prescription now. If there is a problem, you may contact your provider through [employer] and have the prescription routed to another pharmacy.  Your safety is important to us . If you have drug allergies check your prescription carefully.   For the next 24 hours you can use MyChart to ask questions about today's visit, request a non-urgent call back, or ask for a work or school excuse. You will get an email in the next two days asking about your experience. I hope that your e-visit has been valuable and will speed your recovery.  have provided 5 minutes of non face to face time during this encounter for chart review and documentation.

## 2023-06-29 ENCOUNTER — Telehealth: Admitting: Physician Assistant

## 2023-06-29 ENCOUNTER — Ambulatory Visit

## 2023-06-29 DIAGNOSIS — H1030 Unspecified acute conjunctivitis, unspecified eye: Secondary | ICD-10-CM

## 2023-06-29 MED ORDER — MOXIFLOXACIN HCL 0.5 % OP SOLN
1.0000 [drp] | Freq: Three times a day (TID) | OPHTHALMIC | 0 refills | Status: AC
Start: 2023-06-29 — End: 2023-07-04

## 2023-06-29 NOTE — Patient Instructions (Signed)
 Jeffrey Chang, thank you for joining [name], PA-C for today's virtual visit.  While this provider is not your primary care provider (PCP), if your PCP is located in our provider database this encounter information will be shared with them immediately following your visit.   A [facility] MyChart account gives you access to today's visit and all your visits, tests, and labs performed at [facility] " click here if you don't have a [facility] MyChart account or go to [email or web address]  Consent: (Patient) Jeffrey Chang provided verbal consent for this virtual visit at the beginning of the encounter.  Current Medications:  Current Outpatient Medications:    moxifloxacin  (VIGAMOX ) 0.5 % ophthalmic solution, Place 1 drop into both eyes 3 (three) times daily for 5 days., Disp: 3 mL, Rfl: 0   buPROPion  (WELLBUTRIN  XL) 150 MG 24 hr tablet, Take 1 tablet (150 mg total) by mouth daily., Disp: 30 tablet, Rfl: 5   celecoxib  (CELEBREX ) 200 MG capsule, Take 1 capsule (200 mg total) by mouth 2 (two) times daily with a meal., Disp: 60 capsule, Rfl: 0   Crisaborole  (EUCRISA ) 2 % OINT, Apply 1 Application topically daily., Disp: 60 g, Rfl: 2   hydrOXYzine  (VISTARIL ) 25 MG capsule, Take 1 capsule (25 mg total) by mouth every 8 (eight) hours as needed. (Patient not taking: Reported on 05/13/2023), Disp: 30 capsule, Rfl: 0   ibuprofen  (ADVIL ) 800 MG tablet, Take 1 tablet (800 mg total) by mouth every 8 (eight) hours as needed., Disp: 30 tablet, Rfl: 0   levocetirizine (XYZAL ) 5 MG tablet, Take 1 tablet (5 mg total) by mouth every evening., Disp: 30 tablet, Rfl: 2   Medications ordered in this encounter:  Meds ordered this encounter  Medications   moxifloxacin  (VIGAMOX ) 0.5 % ophthalmic solution    Sig: Place 1 drop into both eyes 3 (three) times daily for 5 days.    Dispense:  3 mL    Refill:  0    Supervising Provider:   Corine Dice [[id number]]     *If you need  refills on other medications prior to your next appointment, please contact your pharmacy*  Follow-Up: Call back or seek an in-person evaluation if the symptoms worsen or if the condition fails to improve as anticipated.  [facility] [phone]  Other Instructions  Bacterial Conjunctivitis, Adult Bacterial conjunctivitis is an infection of your conjunctiva. This is the clear membrane that covers the white part of your eye and the inner part of your eyelid. This infection can make your eye: Red or pink. Itchy or irritated. This condition spreads easily from person to person (is contagious) and from one eye to the other eye. What are the causes? This condition is caused by germs (bacteria). You may get the infection if you come into close contact with: A person who has the infection. Items that have germs on them (are contaminated), such as face towels, contact lens solution, or eye makeup. What increases the risk? You are more likely to get this condition if: You have contact with people who have the infection. You wear contact lenses. You have a sinus infection. You have had a recent eye injury or surgery. You have a weak body defense system (immune system). You have dry eyes. What are the signs or symptoms?  Thick, yellowish discharge from the eye. Tearing or watery eyes. Itchy eyes. Burning feeling in your eyes. Eye redness. Swollen eyelids. Blurred vision. How is this treated?  Antibiotic  eye drops or ointment. Antibiotic medicine taken by mouth. This is used for infections that do not get better with drops or ointment or that last more than 10 days. Cool, wet cloths placed on the eyes. Artificial tears used 2-6 times a day. Follow these instructions at home: Medicines Take or apply your antibiotic medicine as told by your doctor. Do not stop using it even if you start to feel better. Take or apply over-the-counter and prescription medicines only as  told by your doctor. Do not touch your eyelid with the eye-drop bottle or the ointment tube. Managing discomfort Wipe any fluid from your eye with a warm, wet washcloth or a cotton ball. Place a clean, cool, wet cloth on your eye. Do this for 10-20 minutes, 3-4 times a day. General instructions Do not wear contacts until the infection is gone. Wear glasses until your doctor says it is okay to wear contacts again. Do not wear eye makeup until the infection is gone. Throw away old eye makeup. Change or wash your pillowcase every day. Do not share towels or washcloths. Wash your hands often with soap and water for at least 20 seconds and especially before touching your face or eyes. Use paper towels to dry your hands. Do not touch or rub your eyes. Do not drive or use heavy machinery if your vision is blurred. Contact a doctor if: You have a fever. You do not get better after 10 days. Get help right away if: You have a fever and your symptoms get worse all of a sudden. You have very bad pain when you move your eye. Your face: Hurts. Is red. Is swollen. You have sudden loss of vision. Summary Bacterial conjunctivitis is an infection of your conjunctiva. This infection spreads easily from person to person. Wash your hands often with soap and water for at least 20 seconds and especially before touching your face or eyes. Use paper towels to dry your hands. Take or apply your antibiotic medicine as told by your doctor. Contact a doctor if you have a fever or you do not get better after 10 days. This information is not intended to replace advice given to you by your health care provider. Make sure you discuss any questions you have with your health care provider. Document Revised: 05/09/2020 Document Reviewed: 05/09/2020 Elsevier Patient Education  2024 Elsevier Inc.   If you have been instructed to have an in-person evaluation today at a local Urgent Care facility, please use the link  below. It will take you to a list of all of our available [facility], including address, phone number and hours of operation. Please do not delay care.  [facility]  If you or a family member do not have a primary care provider, use the link below to schedule a visit and establish care. When you choose a [facility] primary care physician or advanced practice provider, you gain a long-term partner in health. Find a Primary Care Provider  Learn more about [facility]'s in-office and virtual care options: [facility] Get Care Now

## 2023-06-29 NOTE — Progress Notes (Signed)
 Virtual Visit Consent   Jeffrey Chang, you are scheduled for a virtual visit with a [facility] provider today. Just as with appointments in the office, your consent must be obtained to participate. Your consent will be active for this visit and any virtual visit you may have with one of our providers in the next 365 days. If you have a MyChart account, a copy of this consent can be sent to you electronically.  As this is a virtual visit, video technology does not allow for your provider to perform a traditional examination. This may limit your provider's ability to fully assess your condition. If your provider identifies any concerns that need to be evaluated in person or the need to arrange testing (such as labs, EKG, etc.), we will make arrangements to do so. Although advances in technology are sophisticated, we cannot ensure that it will always work on either your end or our end. If the connection with a video visit is poor, the visit may have to be switched to a telephone visit. With either a video or telephone visit, we are not always able to ensure that we have a secure connection.  By engaging in this virtual visit, you consent to the provision of healthcare and authorize for your insurance to be billed (if applicable) for the services provided during this visit. Depending on your insurance coverage, you may receive a charge related to this service.  I need to obtain your verbal consent now. Are you willing to proceed with your visit today? Jeffrey Chang has provided verbal consent on 06/29/2023 for a virtual visit (video or telephone). [name], PA-C  Date: 06/29/2023 4:16 PM   Virtual Visit via Video Note   I, [name], connected with  Jeffrey Chang  ([id number], Sep 23, 1995) on 06/29/23 at  4:00 PM EDT by a video-enabled telemedicine application and verified that I am speaking with the correct person using two identifiers.  Location: Patient: Virtual Visit  Location Patient: Mobile Provider: Virtual Visit Location Provider: Home Office   I discussed the limitations of evaluation and management by telemedicine and the availability of in person appointments. The patient expressed understanding and agreed to proceed.    History of Present Illness: Jeffrey Chang is a 28 y.o. who identifies as a male who was assigned male at birth, and is being seen today for eye itching, drainage, and redness.  HPI: Eye Problem  Both eyes are affected. This is a new problem. The current episode started 1 to 4 weeks ago. The problem occurs constantly. The problem has been unchanged. There was no injury mechanism. There is No known exposure to pink eye. He Does not wear contacts. Associated symptoms include blurred vision, an eye discharge, eye redness, a foreign body sensation and itching. Pertinent negatives include no double vision, fever, nausea or photophobia. Treatments tried: Prescribed Prednisolone ophthalmic drops and reports no improvement. The treatment provided no relief.     Problems:  Patient Active Problem List   Diagnosis Date Noted   Bipolar 1 disorder (HCC)     Allergies:  Allergies  Allergen Reactions   Latex Itching    Other Reaction(s): Other (See Comments)  REDNESS   Anusol -Hc [Hydrocortisone ] Itching and Rash   Medications:  Current Outpatient Medications:    moxifloxacin  (VIGAMOX ) 0.5 % ophthalmic solution, Place 1 drop into both eyes 3 (three) times daily for 5 days., Disp: 3 mL, Rfl: 0   buPROPion  (WELLBUTRIN  XL) 150 MG 24 hr tablet, Take 1 tablet (  150 mg total) by mouth daily., Disp: 30 tablet, Rfl: 5   celecoxib  (CELEBREX ) 200 MG capsule, Take 1 capsule (200 mg total) by mouth 2 (two) times daily with a meal., Disp: 60 capsule, Rfl: 0   Crisaborole  (EUCRISA ) 2 % OINT, Apply 1 Application topically daily., Disp: 60 g, Rfl: 2   hydrOXYzine  (VISTARIL ) 25 MG capsule, Take 1 capsule (25 mg total) by mouth every 8 (eight) hours as  needed. (Patient not taking: Reported on 05/13/2023), Disp: 30 capsule, Rfl: 0   ibuprofen  (ADVIL ) 800 MG tablet, Take 1 tablet (800 mg total) by mouth every 8 (eight) hours as needed., Disp: 30 tablet, Rfl: 0   levocetirizine (XYZAL ) 5 MG tablet, Take 1 tablet (5 mg total) by mouth every evening., Disp: 30 tablet, Rfl: 2  Observations/Objective: Patient is well-developed, well-nourished in no acute distress.  Resting comfortably at home.  Head is normocephalic, atraumatic.  No labored breathing.  Speech is clear and coherent with logical content.  Patient is alert and oriented at baseline.    Assessment and Plan: 1. Acute bacterial conjunctivitis, unspecified laterality (Primary) - moxifloxacin  (VIGAMOX ) 0.5 % ophthalmic solution; Place 1 drop into both eyes 3 (three) times daily for 5 days.  Dispense: 3 mL; Refill: 0  - Suspect bacterial conjunctivitis - Vigamox  prescribed - Warm compresses - Good hand hygiene - Seek in person evaluation if symptoms worsen or fail to improve   Follow Up Instructions: I discussed the assessment and treatment plan with the patient. The patient was provided an opportunity to ask questions and all were answered. The patient agreed with the plan and demonstrated an understanding of the instructions.  A copy of instructions were sent to the patient via MyChart unless otherwise noted below.    The patient was advised to call back or seek an in-person evaluation if the symptoms worsen or if the condition fails to improve as anticipated.    [name], PA-C

## 2023-07-01 ENCOUNTER — Telehealth: Admitting: Nurse Practitioner

## 2023-07-01 DIAGNOSIS — H60509 Unspecified acute noninfective otitis externa, unspecified ear: Secondary | ICD-10-CM

## 2023-07-01 MED ORDER — OFLOXACIN 0.3 % OT SOLN: 5.0000 [drp] | Freq: Every day | OTIC | 0 refills | Status: AC

## 2023-07-01 NOTE — Progress Notes (Signed)
 E Visit for Ear Pain - Swimmer's Ear  We are sorry that you are not feeling well. Here is how we plan to help!  Based on what you have shared with me it looks like you have Swimmer's Ear.  Swimmer's ear is a redness or swelling, irritation, or infection of your outer ear canal. These symptoms usually occur within a few days of swimming. Your ear canal is a tube that goes from the opening of the ear to the eardrum.  When water stays in your ear canal, germs can grow.  This is a painful condition that often happens to children and swimmers of all ages.  It is not contagious and oral antibiotics are not required to treat uncomplicated swimmer's ear.  The usual symptoms include:    Itchiness inside the ear  Redness or a sense of swelling in the ear  Pain when the ear is tugged on when pressure is placed on the ear  Pus draining from the infected ear   Meds ordered this encounter  Medications   ofloxacin (FLOXIN) 0.3 % OTIC solution    Sig: Place 5 drops into both ears daily for 5 days.    Dispense:  5 mL    Refill:  0       In certain cases, swimmer's ear may progress to a more serious bacterial infection of the middle or inner ear.  If you have a fever 102 and up and significantly worsening symptoms, this could indicate a more serious infection moving to the middle/inner and needs face to face evaluation in an office by a provider.  Your symptoms should improve over the next 3 days and should resolve in about 7 days.  Be sure to complete ALL of your prescription.  HOME CARE: Wash your hands frequently. If you are prescribed an ear drop, do not place the tip of the bottle on your ear or touch it with your fingers. You can take Acetaminophen  650 mg every 4-6 hours as needed for pain.  If pain is severe or moderate, you can apply a heating pad (set on low) or hot water bottle (wrapped in a towel) to outer ear for 20 minutes.  This will also increase drainage. Avoid ear plugs Do not go  swimming until the symptoms are gone Do not use Q-tips After showers, help the water run out by tilting your head to one side.   GET HELP RIGHT AWAY IF: Fever is over 102.2 degrees. You develop progressive ear pain or hearing loss. Ear symptoms persist longer than 3 days after treatment.  MAKE SURE YOU: Understand these instructions. Will watch your condition. Will get help right away if you are not doing well or get worse.  TO PREVENT SWIMMER'S EAR: Use a bathing cap or custom fitted swim molds to keep your ears dry. Towel off after swimming to dry your ears. Tilt your head or pull your earlobes to allow the water to escape your ear canal. If there is still water in your ears, consider using a hairdryer on the lowest setting.  Thank you for choosing an e-visit.  Your e-visit answers were reviewed by a board certified advanced clinical practitioner to complete your personal care plan. Depending upon the condition, your plan could have included both over the counter or prescription medications.  Please review your pharmacy choice. Make sure the pharmacy is open so you can pick up the prescription now. If there is a problem, you may contact your provider through [employer]  and have the prescription routed to another pharmacy.  Your safety is important to us . If you have drug allergies check your prescription carefully.   For the next 24 hours you can use MyChart to ask questions about today's visit, request a non-urgent call back, or ask for a work or school excuse. You will get an email with a survey after your eVisit asking about your experience. We would appreciate your feedback. I hope that your e-visit has been valuable and will aid in your recovery.   I spent approximately 5 minutes reviewing the patient's history, current symptoms and coordinating their care today.

## 2023-07-08 ENCOUNTER — Telehealth: Admitting: Nurse Practitioner

## 2023-07-08 ENCOUNTER — Other Ambulatory Visit: Payer: Self-pay | Admitting: Nurse Practitioner

## 2023-07-08 DIAGNOSIS — B372 Candidiasis of skin and nail: Secondary | ICD-10-CM | POA: Diagnosis not present

## 2023-07-08 MED ORDER — FLUCONAZOLE 150 MG PO TABS: 150.0000 mg | ORAL_TABLET | Freq: Once | ORAL | 0 refills | Status: AC

## 2023-07-08 MED ORDER — NYSTATIN 100000 UNIT/ML MT SUSP: OROMUCOSAL | 0 refills | Status: DC

## 2023-07-08 MED ORDER — NYSTATIN 100000 UNIT/GM EX CREA: 1.0000 | TOPICAL_CREAM | Freq: Two times a day (BID) | CUTANEOUS | 0 refills | Status: DC

## 2023-07-08 MED ORDER — NYSTATIN 100000 UNIT/GM EX POWD: 1.0000 | Freq: Three times a day (TID) | CUTANEOUS | 1 refills | Status: DC

## 2023-07-08 NOTE — Progress Notes (Signed)
 E Visit for Rash  We are sorry that you are not feeling well. Here is how we plan to help!   Based upon your presentation it appears you have a fungal infection.  I have prescribed: and Nystatin  cream apply to the affected area twice daily We will also prescribe a powder and an oral rinse for your mouth. You may want to use the cream on your face, the powder on your bottom and the rinse in your mouth for best relief.  We will also prescribe a one time pill for added relief of yeast/fungal symptoms  Meds ordered this encounter  Medications   nystatin  cream (MYCOSTATIN )    Sig: Apply 1 Application topically 2 (two) times daily. May use on face until rash resolves    Dispense:  30 g    Refill:  0   nystatin  (MYCOSTATIN /NYSTOP ) powder    Sig: Apply 1 Application topically 3 (three) times daily. Use on buttock for relief from rash    Dispense:  15 g    Refill:  1   nystatin  (MYCOSTATIN ) 100000 UNIT/ML suspension    Sig: Swish and spit four times daily until relief from oral symptoms or thrush    Dispense:  473 mL    Refill:  0   fluconazole  (DIFLUCAN ) 150 MG tablet    Sig: Take 1 tablet (150 mg total) by mouth once for 1 dose.    Dispense:  1 tablet    Refill:  0      HOME CARE:  Try to keep skin dry, avoid scented soaps or lotions  Change clothing frequently if sweaty    GET HELP RIGHT AWAY IF:  Symptoms don't go away after treatment. Severe itching that persists. If you rash spreads or swells. If you rash begins to smell. If it blisters and opens or develops a yellow-brown crust. You develop a fever. You have a sore throat. You become short of breath.  MAKE SURE YOU:  Understand these instructions. Will watch your condition. Will get help right away if you are not doing well or get worse.  Thank you for choosing an e-visit.  Your e-visit answers were reviewed by a board certified advanced clinical practitioner to complete your personal care plan. Depending upon the  condition, your plan could have included both over the counter or prescription medications.  Please review your pharmacy choice. Make sure the pharmacy is open so you can pick up prescription now. If there is a problem, you may contact your provider through [employer] and have the prescription routed to another pharmacy.  Your safety is important to us . If you have drug allergies check your prescription carefully.   For the next 24 hours you can use MyChart to ask questions about today's visit, request a non-urgent call back, or ask for a work or school excuse. You will get an email in the next two days asking about your experience. I hope that your e-visit has been valuable and will speed your recovery.  I spent approximately 5 minutes reviewing the patient's history, current symptoms and coordinating their care today.

## 2023-07-09 ENCOUNTER — Encounter

## 2023-07-09 ENCOUNTER — Telehealth: Admitting: Physician Assistant

## 2023-07-09 DIAGNOSIS — R21 Rash and other nonspecific skin eruption: Secondary | ICD-10-CM

## 2023-07-09 NOTE — Progress Notes (Signed)
  Because of multiple acute complaints and recently being treated for a rash without changes or improvements, I feel your condition warrants further evaluation and I recommend that you be seen in a face-to-face visit.   NOTE: There will be NO CHARGE for this E-Visit   If you are having a true medical emergency, please call 911.     For an urgent face to face visit, [facility] has multiple urgent care centers for your convenience.  Click the link below for the full list of locations and hours, walk-in wait times, appointment scheduling options and driving directions:  Urgent Care - [place], [place], [place], [place], [place], [place]  [place]     Your MyChart E-visit questionnaire answers were reviewed by a board certified advanced clinical practitioner to complete your personal care plan based on your specific symptoms.    Thank you for using e-Visits.      I have spent 5 minutes in review of e-visit questionnaire, review and updating patient chart, medical decision making and response to patient.   [name], PA-C

## 2023-07-10 ENCOUNTER — Encounter

## 2023-07-10 ENCOUNTER — Telehealth: Admitting: Family Medicine

## 2023-07-10 DIAGNOSIS — M65052 Abscess of tendon sheath, left thigh: Secondary | ICD-10-CM | POA: Diagnosis not present

## 2023-07-10 MED ORDER — SULFAMETHOXAZOLE-TRIMETHOPRIM 800-160 MG PO TABS: 1.0000 | ORAL_TABLET | Freq: Two times a day (BID) | ORAL | 0 refills | Status: AC

## 2023-07-10 NOTE — Patient Instructions (Signed)
 Skin Abscess  A skin abscess is an infected area on or under your skin. It contains pus and other material. An abscess may also be called a furuncle, carbuncle, or boil. It is often the result of an infection caused by bacteria. An abscess can occur in or on almost any part of your body. Sometimes, an abscess may break open (rupture) on its own. In most cases, it will keep getting worse unless it is treated. An abscess can cause pain and make you feel ill. An untreated abscess can cause infection to spread to other parts of your body or your bloodstream. The abscess may need to be drained. You may also need to take antibiotics. What are the causes? An abscess occurs when germs, like bacteria, pass through your skin and cause an infection. This may be caused by: A scrape or cut on your skin. A puncture wound through your skin, such as a needle injection or insect bite. Blocked oil or sweat glands. Blocked and infected hair follicles. A fluid-filled sac that forms beneath your skin (sebaceous cyst) and becomes infected. What increases the risk? You may be more likely to develop an abscess if: You have problems with blood circulation, or you have a weak body defense system (immune system). You have diabetes. You have dry and irritated skin. You get injections often or use IV drugs. You have a foreign body in a wound, such as a splinter. You smoke or use tobacco products. What are the signs or symptoms? Symptoms of this condition include: A painful, firm bump under the skin. A bump with pus at the top. This may break through the skin and drain. Other symptoms include: Redness and swelling around the abscess. Warmth or tenderness. Swelling of the lymph nodes (glands) near the abscess. A sore on the skin. How is this diagnosed? This condition may be diagnosed based on a physical exam and your medical history. You may also have tests done, such as: A test of a sample of pus. This may be done  to find what is causing the infection. Blood tests. Imaging tests, such as an ultrasound, CT scan, or MRI. How is this treated? A small abscess that drains on its own may not need to be treated. Treatment for larger abscesses may include: Moist heat or a heat pack applied to the area a few times a day. Incision and drainage. This is a procedure to drain the abscess. Antibiotics. For a severe abscess, you may first get antibiotics through an IV and then change to antibiotics by mouth. Follow these instructions at home: Medicines Take over-the-counter and prescription medicines only as told by your provider. If you were prescribed antibiotics, take them as told by your provider. Do not stop using the antibiotic even if you start to feel better. Abscess care  If you have an abscess that has not drained, apply heat to the affected area. Use the heat source that your provider recommends, such as a moist heat pack or a heating pad. Place a towel between your skin and the heat source. Leave the heat on for 20-30 minutes at a time. If your skin turns bright red, remove the heat right away to prevent burns. The risk of burns is higher if you cannot feel pain, heat, or cold. Follow instructions from your provider about how to take care of your abscess. Make sure you: Cover the abscess with a bandage (dressing). Wash your hands with soap and water for at least 20 seconds before  and after you change the dressing or gauze. If soap and water are not available, use hand sanitizer. Change your dressing or gauze as told by your provider. Check your abscess every day for signs of an infection that is getting worse. Check for: More redness, swelling, pain, or tenderness. More fluid or blood. Warmth. More pus or a worse smell. General instructions To avoid spreading the infection: Do not share personal care items, towels, or hot tubs with others. Avoid making skin contact with other people. Be careful  when getting rid of used dressings, wound packing, or any drainage from the abscess. Do not use any products that contain nicotine or tobacco. These products include cigarettes, chewing tobacco, and vaping devices, such as e-cigarettes. If you need help quitting, ask your provider. Do not use any creams, ointments, or liquids unless you have been told to by your provider. Contact a health care provider if: You see redness that spreads quickly or red streaks on your skin spreading away from the abscess. You have any signs of worse infection at the abscess. You vomit every time you eat or drink. You have a fever, chills, or muscle aches. The cyst or abscess returns. Get help right away if: You have severe pain. You make less pee (urine) than normal. This information is not intended to replace advice given to you by your health care provider. Make sure you discuss any questions you have with your health care provider. Document Revised: 09/11/2021 Document Reviewed: 09/11/2021 Elsevier Patient Education  2024 [employer].

## 2023-07-10 NOTE — Progress Notes (Signed)
 Virtual Visit Consent   Jeffrey Chang, you are scheduled for a virtual visit with a [facility] provider today. Just as with appointments in the office, your consent must be obtained to participate. Your consent will be active for this visit and any virtual visit you may have with one of our providers in the next 365 days. If you have a MyChart account, a copy of this consent can be sent to you electronically.  As this is a virtual visit, video technology does not allow for your provider to perform a traditional examination. This may limit your provider's ability to fully assess your condition. If your provider identifies any concerns that need to be evaluated in person or the need to arrange testing (such as labs, EKG, etc.), we will make arrangements to do so. Although advances in technology are sophisticated, we cannot ensure that it will always work on either your end or our end. If the connection with a video visit is poor, the visit may have to be switched to a telephone visit. With either a video or telephone visit, we are not always able to ensure that we have a secure connection.  By engaging in this virtual visit, you consent to the provision of healthcare and authorize for your insurance to be billed (if applicable) for the services provided during this visit. Depending on your insurance coverage, you may receive a charge related to this service.  I need to obtain your verbal consent now. Are you willing to proceed with your visit today? Jeffrey Chang has provided verbal consent on 07/10/2023 for a virtual visit (video or telephone). Jeffrey Huger, FNP  Date: 07/10/2023 4:49 PM   Virtual Visit via Video Note   I, Jeffrey Chang, connected with  Jeffrey Chang  ([id number], 07/22/1995) on 07/10/23 at  4:45 PM EDT by a video-enabled telemedicine application and verified that I am speaking with the correct person using two identifiers.  Location: Patient: Virtual Visit Location Patient:  Home Provider: Virtual Visit Location Provider: Home Office   I discussed the limitations of evaluation and management by telemedicine and the availability of in person appointments. The patient expressed understanding and agreed to proceed.    History of Present Illness: Jeffrey Chang is a 28 y.o. who identifies as a male who was assigned male at birth, and is being seen today for abscess right inner thigh, no drainage, red and painful.He has a history of this and bactrim  helped the most in the past. .  HPI: HPI  Problems:  Patient Active Problem List   Diagnosis Date Noted   Bipolar 1 disorder (HCC)     Allergies:  Allergies  Allergen Reactions   Latex Itching    Other Reaction(s): Other (See Comments)  REDNESS   Anusol -Hc [Hydrocortisone ] Itching and Rash   Medications:  Current Outpatient Medications:    sulfamethoxazole -trimethoprim  (BACTRIM  DS) 800-160 MG tablet, Take 1 tablet by mouth 2 (two) times daily for 10 days., Disp: 20 tablet, Rfl: 0   buPROPion  (WELLBUTRIN  XL) 150 MG 24 hr tablet, Take 1 tablet (150 mg total) by mouth daily., Disp: 30 tablet, Rfl: 5   celecoxib  (CELEBREX ) 200 MG capsule, Take 1 capsule (200 mg total) by mouth 2 (two) times daily with a meal., Disp: 60 capsule, Rfl: 0   Crisaborole  (EUCRISA ) 2 % OINT, Apply 1 Application topically daily., Disp: 60 g, Rfl: 2   hydrOXYzine  (VISTARIL ) 25 MG capsule, Take 1 capsule (25 mg total) by mouth every 8 (eight) hours as needed. (  Patient not taking: Reported on 05/13/2023), Disp: 30 capsule, Rfl: 0   ibuprofen  (ADVIL ) 800 MG tablet, Take 1 tablet (800 mg total) by mouth every 8 (eight) hours as needed., Disp: 30 tablet, Rfl: 0   levocetirizine (XYZAL ) 5 MG tablet, Take 1 tablet (5 mg total) by mouth every evening., Disp: 30 tablet, Rfl: 2   nystatin  (MYCOSTATIN ) 100000 UNIT/ML suspension, Swish and spit four times daily until relief from oral symptoms or thrush, Disp: 473 mL, Rfl: 0   nystatin  (MYCOSTATIN /NYSTOP )  powder, Apply 1 Application topically 3 (three) times daily. Use on buttock for relief from rash, Disp: 15 g, Rfl: 1   nystatin  cream (MYCOSTATIN ), Apply 1 Application topically 2 (two) times daily. May use on face until rash resolves, Disp: 30 g, Rfl: 0  Observations/Objective: Patient is well-developed, well-nourished in no acute distress.  Resting comfortably  at home.  Head is normocephalic, atraumatic.  No labored breathing.  Speech is clear and coherent with logical content.  Patient is alert and oriented at baseline.    Assessment and Plan: 1. Abscess of tendon sheath of left thigh (Primary)  Warm compressed. UC if sx persist or worsen.   Follow Up Instructions: I discussed the assessment and treatment plan with the patient. The patient was provided an opportunity to ask questions and all were answered. The patient agreed with the plan and demonstrated an understanding of the instructions.  A copy of instructions were sent to the patient via MyChart unless otherwise noted below.     The patient was advised to call back or seek an in-person evaluation if the symptoms worsen or if the condition fails to improve as anticipated.    Jeffrey Tomasello, FNP

## 2023-07-16 ENCOUNTER — Telehealth: Admitting: Physician Assistant

## 2023-07-16 DIAGNOSIS — T65811A Toxic effect of latex, accidental (unintentional), initial encounter: Secondary | ICD-10-CM

## 2023-07-16 DIAGNOSIS — L509 Urticaria, unspecified: Secondary | ICD-10-CM

## 2023-07-16 DIAGNOSIS — R609 Edema, unspecified: Secondary | ICD-10-CM | POA: Diagnosis not present

## 2023-07-16 MED ORDER — CETIRIZINE HCL 10 MG PO TABS: 10.0000 mg | ORAL_TABLET | Freq: Every day | ORAL | 0 refills | Status: DC

## 2023-07-16 MED ORDER — EPINEPHRINE 0.3 MG/0.3ML IJ SOAJ: 0.3000 mg | INTRAMUSCULAR | 0 refills | Status: AC | PRN

## 2023-07-16 MED ORDER — PREDNISONE 10 MG (21) PO TBPK: ORAL_TABLET | ORAL | 0 refills | Status: DC

## 2023-07-16 NOTE — Patient Instructions (Signed)
 [name], thank you for joining [name], PA-C for today's virtual visit.  While this provider is not your primary care provider (PCP), if your PCP is located in our provider database this encounter information will be shared with them immediately following your visit.   A [facility] MyChart account gives you access to today's visit and all your visits, tests, and labs performed at [facility] " click here if you don't have a [facility] MyChart account or go to [email or web address]  Consent: (Patient) Jeffrey Chang provided verbal consent for this virtual visit at the beginning of the encounter.  Current Medications:  Current Outpatient Medications:    cetirizine  (ZYRTEC ) 10 MG tablet, Take 1 tablet (10 mg total) by mouth daily., Disp: 30 tablet, Rfl: 0   EPINEPHrine (EPIPEN 2-PAK) 0.3 mg/0.3 mL IJ SOAJ injection, Inject 0.3 mg into the muscle as needed for anaphylaxis., Disp: 1 each, Rfl: 0   predniSONE  (STERAPRED UNI-PAK 21 TAB) 10 MG (21) TBPK tablet, 6 day taper; take as directed on package instructions, Disp: 21 tablet, Rfl: 0   buPROPion  (WELLBUTRIN  XL) 150 MG 24 hr tablet, Take 1 tablet (150 mg total) by mouth daily., Disp: 30 tablet, Rfl: 5   celecoxib  (CELEBREX ) 200 MG capsule, Take 1 capsule (200 mg total) by mouth 2 (two) times daily with a meal., Disp: 60 capsule, Rfl: 0   Crisaborole  (EUCRISA ) 2 % OINT, Apply 1 Application topically daily., Disp: 60 g, Rfl: 2   hydrOXYzine  (VISTARIL ) 25 MG capsule, Take 1 capsule (25 mg total) by mouth every 8 (eight) hours as needed. (Patient not taking: Reported on 05/13/2023), Disp: 30 capsule, Rfl: 0   ibuprofen  (ADVIL ) 800 MG tablet, Take 1 tablet (800 mg total) by mouth every 8 (eight) hours as needed., Disp: 30 tablet, Rfl: 0   levocetirizine (XYZAL ) 5 MG tablet, Take 1 tablet (5 mg total) by mouth every evening., Disp: 30 tablet, Rfl: 2   nystatin  (MYCOSTATIN ) 100000 UNIT/ML suspension, Swish and spit  four times daily until relief from oral symptoms or thrush, Disp: 473 mL, Rfl: 0   nystatin  (MYCOSTATIN /NYSTOP ) powder, Apply 1 Application topically 3 (three) times daily. Use on buttock for relief from rash, Disp: 15 g, Rfl: 1   nystatin  cream (MYCOSTATIN ), Apply 1 Application topically 2 (two) times daily. May use on face until rash resolves, Disp: 30 g, Rfl: 0   sulfamethoxazole -trimethoprim  (BACTRIM  DS) 800-160 MG tablet, Take 1 tablet by mouth 2 (two) times daily for 10 days., Disp: 20 tablet, Rfl: 0   Medications ordered in this encounter:  Meds ordered this encounter  Medications   predniSONE  (STERAPRED UNI-PAK 21 TAB) 10 MG (21) TBPK tablet    Sig: 6 day taper; take as directed on package instructions    Dispense:  21 tablet    Refill:  0    Supervising Provider:   LAMPTEY, PHILIP O [[id number]]   cetirizine  (ZYRTEC ) 10 MG tablet    Sig: Take 1 tablet (10 mg total) by mouth daily.    Dispense:  30 tablet    Refill:  0    Supervising Provider:   Corine Dice [[id number]]   EPINEPHrine (EPIPEN 2-PAK) 0.3 mg/0.3 mL IJ SOAJ injection    Sig: Inject 0.3 mg into the muscle as needed for anaphylaxis.    Dispense:  1 each    Refill:  0    Supervising Provider:   Corine Dice [id number]     *If you need refills on other  medications prior to your next appointment, please contact your pharmacy*  Follow-Up: Call back or seek an in-person evaluation if the symptoms worsen or if the condition fails to improve as anticipated.  [facility] [phone]  Other Instructions Latex Allergy A latex allergy is an abnormal reaction to latex by the body's defense system (immune system). Latex is a fluid made by certain trees. It is used to make many rubber products, such as: Gloves. Rubber bands. Balloons. Condoms. Adhesive tape. Adhesive bandages. Having a latex allergy increases your chances of developing allergies to certain foods. These foods include avocados, bananas,  chestnuts, kiwis, and tomatoes. What are the causes? This condition occurs when your immune system mistakenly reacts to latex as a harmful substance and releases proteins called antibodies to fight it. What increases the risk? You are more likely to develop a latex allergy if: You work in health care or wear latex gloves often. You are getting medical care and have had 10 or more medical procedures. You work in an Media planner where you are exposed to natural rubber latex, such as in a Environmental education officer. You have several allergies such as allergic rhinitis or allergies to many foods. What are the signs or symptoms? Symptoms are triggered when you touch an item that was made with latex or you breathe in particles of latex. Symptoms may be mild or severe. Symptoms usually start minutes after you are exposed to latex, but they can occur even a few hours after the exposure. In people with a severe allergy, symptoms can start within seconds. Mild symptoms of this condition include: Stuffy nose. Itchy, red, swollen areas of skin (hives). Watery eyes. Coughing. Severe symptoms can be a sign of a life-threatening reaction called anaphylaxis. Get help right away if you have symptoms of anaphylaxis, such as: Chest tightness. Trouble breathing, speaking, or swallowing. Swelling of your eyes, lips, face, mouth, tongue, or throat. Tingling in your mouth. Wheezing. This is making high-pitched whistling sounds when you breathe, most often when you breathe out. Feeling warm in the face (flushed). Your skin may also turn reddish. Dizziness, light-headedness, or fainting. How is this diagnosed? This condition may be diagnosed with: A physical exam. Medical and family history. Skin tests or blood tests. How is this treated?  There is no cure for this condition, but treatment can relieve symptoms and stop serious complications of a latex allergy. Mild symptoms may not need treatment. If you have had a severe  allergic reaction (anaphylactic reaction), you may be prescribed rescue medicines to take if you are accidentally exposed to latex. Your health care provider may teach you: How to use an anaphylaxis kit. How to give yourself an epinephrine injection using what is commonly called an auto-injector "pen" (pre-filled automatic injection device). If you think that you are having an anaphylactic reaction, use your auto-injector pen or anaphylaxis kit. If you use a pen or kit, you must still get emergency medical treatment. Severe reactions usually need to be treated at the hospital. Treatment may include: Medicines. These may include: Epinephrine to help tighten your blood vessels. Antihistamines to relieve itching and hives. Bronchodilators to widen the narrow and tight airways. Corticosteroids to reduce swelling. Oxygen therapy to help you breathe. IV fluids to keep you hydrated. Follow these instructions at home: If you have a severe latex allergy:  Wear a medical alert bracelet or necklace that describes your latex allergy. Carry your anaphylaxis kit or an auto-injector pen with you at all times. Use  it as told by your health care provider. Make sure that you, your family members, and your employer know: The signs of anaphylaxis. How to use an anaphylaxis kit. How to use an auto-injector pen. If you think that you are having an anaphylactic reaction, use your auto-injector pen or anaphylaxis kit. Replace your auto-injector pen right away after use, in case you have another reaction. Get medical care even after you use your auto-injector pen. This is important because you can have a delayed, life-threatening reaction after taking the medicine (rebound anaphylaxis). General instructions Avoid exposure to latex. Use latex-free alternatives. Be careful of products labeled "hypoallergenic." "Hypoallergenic" does not mean "latex-free." Take over-the-counter and prescription medicines only as told  by your health care provider. If you have hives or a rash: Use an over-the-counter antihistamine as told. Apply cold, wet cloths (cold compresses) to your skin or take baths or showers in cool water. Avoid hot water. Tell all your health care providers that you have a latex allergy. Keep all follow-up visits. This is important. Contact a health care provider if: Your symptoms have not gone away within 2 days. You develop new symptoms. Get help right away if: You have symptoms of anaphylaxis. These include: Chest tightness. Trouble breathing, speaking, or swallowing. Swelling of your eyes, lips, face, mouth, tongue, or throat. Tingling in your mouth. Wheezing. Flushed skin. Dizziness, light-headedness, or fainting. These symptoms may represent a serious problem that is an emergency. Do not wait to see if the symptoms will go away. Get medical help right away. Call your local emergency services (911 in the [place] Do not drive yourself to the hospital. Summary A latex allergy is an abnormal reaction to latex by the body's defense system (immune system). A severe latex allergy can cause a life-threatening reaction called anaphylaxis. This must be treated right away. If you have a severe latex allergy, carry your anaphylaxis kit or auto-injector pen with you at all times. Use it as told by your health care provider. This information is not intended to replace advice given to you by your health care provider. Make sure you discuss any questions you have with your health care provider. Document Revised: 04/17/2020 Document Reviewed: 04/17/2020 Elsevier Patient Education  2024 Elsevier Inc.   If you have been instructed to have an in-person evaluation today at a local Urgent Care facility, please use the link below. It will take you to a list of all of our available [facility], including address, phone number and hours of operation. Please do not delay care.  [facility]  If you or a family member do not have a primary care provider, use the link below to schedule a visit and establish care. When you choose a [facility] primary care physician or advanced practice provider, you gain a long-term partner in health. Find a Primary Care Provider  Learn more about [facility]'s in-office and virtual care options: [facility] - Get Care Now

## 2023-07-16 NOTE — Progress Notes (Signed)
 Virtual Visit Consent   Dakwon Reichow, you are scheduled for a virtual visit with a [facility] provider today. Just as with appointments in the office, your consent must be obtained to participate. Your consent will be active for this visit and any virtual visit you may have with one of our providers in the next 365 days. If you have a MyChart account, a copy of this consent can be sent to you electronically.  As this is a virtual visit, video technology does not allow for your provider to perform a traditional examination. This may limit your provider's ability to fully assess your condition. If your provider identifies any concerns that need to be evaluated in person or the need to arrange testing (such as labs, EKG, etc.), we will make arrangements to do so. Although advances in technology are sophisticated, we cannot ensure that it will always work on either your end or our end. If the connection with a video visit is poor, the visit may have to be switched to a telephone visit. With either a video or telephone visit, we are not always able to ensure that we have a secure connection.  By engaging in this virtual visit, you consent to the provision of healthcare and authorize for your insurance to be billed (if applicable) for the services provided during this visit. Depending on your insurance coverage, you may receive a charge related to this service.  I need to obtain your verbal consent now. Are you willing to proceed with your visit today? Abenezer Raggio has provided verbal consent on 07/16/2023 for a virtual visit (video or telephone). [name], PA-C  Date: 07/16/2023 4:15 PM   Virtual Visit via Video Note   I, [name], connected with  Daylyn Azbill  ([id number], 1995/05/03) on 07/16/23 at  4:00 PM EDT by a video-enabled telemedicine application and verified that I am speaking with the correct person using two identifiers.  Location: Patient: Virtual Visit Location  Patient: Home Provider: Virtual Visit Location Provider: Home Office   I discussed the limitations of evaluation and management by telemedicine and the availability of in person appointments. The patient expressed understanding and agreed to proceed.    History of Present Illness: Jeffrey Chang is a 28 y.o. who identifies as a male who was assigned male at birth, and is being seen today for allergic reaction to latex.  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 Today he thinks he accidentally touched someone or they touched him. He started having mild swelling sensations of the face and lips. Also, reports some mild shortness of breath that was alleviated with his albuterol  inhaler. Currently, not having shortness of breath, difficulty breathing, he is able to speak in full, complete, coherent sentences.   Problems:  Patient Active Problem List   Diagnosis Date Noted   Bipolar 1 disorder (HCC)     Allergies:  Allergies  Allergen Reactions   Latex Itching    Other Reaction(s): Other (See Comments)  REDNESS   Anusol -Hc [Hydrocortisone ] Itching and Rash   Medications:  Current Outpatient Medications:    cetirizine  (ZYRTEC ) 10 MG tablet, Take 1 tablet (10 mg total) by mouth daily., Disp: 30 tablet, Rfl: 0   EPINEPHrine (EPIPEN 2-PAK) 0.3 mg/0.3 mL IJ SOAJ injection, Inject 0.3 mg into the muscle as needed for anaphylaxis., Disp: 1 each, Rfl: 0   predniSONE  (STERAPRED UNI-PAK 21 TAB) 10 MG (21) TBPK tablet, 6 day taper; take as directed on package  instructions, Disp: 21 tablet, Rfl: 0   buPROPion  (WELLBUTRIN  XL) 150 MG 24 hr tablet, Take 1 tablet (150 mg total) by mouth daily., Disp: 30 tablet, Rfl: 5   celecoxib  (CELEBREX ) 200 MG capsule, Take 1 capsule (200 mg total) by mouth 2 (two) times daily with a meal., Disp: 60 capsule, Rfl: 0   Crisaborole  (EUCRISA ) 2 % OINT, Apply 1 Application topically daily., Disp: 60 g, Rfl: 2   hydrOXYzine  (VISTARIL ) 25 MG capsule, Take 1  capsule (25 mg total) by mouth every 8 (eight) hours as needed. (Patient not taking: Reported on 05/13/2023), Disp: 30 capsule, Rfl: 0   ibuprofen  (ADVIL ) 800 MG tablet, Take 1 tablet (800 mg total) by mouth every 8 (eight) hours as needed., Disp: 30 tablet, Rfl: 0   levocetirizine (XYZAL ) 5 MG tablet, Take 1 tablet (5 mg total) by mouth every evening., Disp: 30 tablet, Rfl: 2   nystatin  (MYCOSTATIN ) 100000 UNIT/ML suspension, Swish and spit four times daily until relief from oral symptoms or thrush, Disp: 473 mL, Rfl: 0   nystatin  (MYCOSTATIN /NYSTOP ) powder, Apply 1 Application topically 3 (three) times daily. Use on buttock for relief from rash, Disp: 15 g, Rfl: 1   nystatin  cream (MYCOSTATIN ), Apply 1 Application topically 2 (two) times daily. May use on face until rash resolves, Disp: 30 g, Rfl: 0   sulfamethoxazole -trimethoprim  (BACTRIM  DS) 800-160 MG tablet, Take 1 tablet by mouth 2 (two) times daily for 10 days., Disp: 20 tablet, Rfl: 0  Observations/Objective: Patient is well-developed, well-nourished in no acute distress.  Resting comfortably at home.  Head is normocephalic, atraumatic.  No labored breathing.  Speech is clear and coherent with logical content.  Patient is alert and oriented at baseline.  No obvious swelling of the face, lips, or tongue noted to prompt emergent evaluation  Assessment and Plan: 1. Hives (Primary)  2. Swelling  3. Allergic reaction to latex, accidental or unintentional, initial encounter - predniSONE  (STERAPRED UNI-PAK 21 TAB) 10 MG (21) TBPK tablet; 6 day taper; take as directed on package instructions  Dispense: 21 tablet; Refill: 0 - cetirizine  (ZYRTEC ) 10 MG tablet; Take 1 tablet (10 mg total) by mouth daily.  Dispense: 30 tablet; Refill: 0 - EPINEPHrine (EPIPEN 2-PAK) 0.3 mg/0.3 mL IJ SOAJ injection; Inject 0.3 mg into the muscle as needed for anaphylaxis.  Dispense: 1 each; Refill: 0  - Will provide Prednisone  6 day taper for acute allergic  reaction to latex at work - Cetirizine  provided for daily use, especially on days he is working to hopefully reduce any reactions, can also take as needed for acute reactions - Epi-pen provided at patient request. Advised that if he ever feels the need to use his Epi-pen for a severe reaction, he needs to also be calling 9-1-1, not taking all the time - Strict precaution tonight to proceed to ER if his breathing worsens prior to being able to pick up the medications - Follow up with PCP for allergy testing - Follow up in person if symptoms do not resolve  Follow Up Instructions: I discussed the assessment and treatment plan with the patient. The patient was provided an opportunity to ask questions and all were answered. The patient agreed with the plan and demonstrated an understanding of the instructions.  A copy of instructions were sent to the patient via MyChart unless otherwise noted below.    The patient was advised to call back or seek an in-person evaluation if the symptoms worsen or if the condition fails  to improve as anticipated.    [name], PA-C

## 2023-07-20 ENCOUNTER — Other Ambulatory Visit: Payer: Self-pay | Admitting: Nurse Practitioner

## 2023-07-20 DIAGNOSIS — B372 Candidiasis of skin and nail: Secondary | ICD-10-CM

## 2023-07-20 DIAGNOSIS — B370 Candidal stomatitis: Secondary | ICD-10-CM

## 2023-07-20 MED ORDER — NYSTATIN 100000 UNIT/ML MT SUSP: OROMUCOSAL | 0 refills | Status: DC

## 2023-07-21 MED ORDER — NYSTATIN 100000 UNIT/ML MT SUSP: OROMUCOSAL | 0 refills | Status: DC

## 2023-07-21 NOTE — Addendum Note (Signed)
 Addended by: Elner Hahn on: 07/21/2023 08:52 AM   Modules accepted: Orders

## 2023-07-21 NOTE — Progress Notes (Signed)
 Resent rx  Meds ordered this encounter  Medications   DISCONTD: nystatin  (MYCOSTATIN ) 100000 UNIT/ML suspension    Sig: Swish and spit four times daily until relief from oral symptoms or thrush    Dispense:  473 mL    Refill:  0   nystatin  (MYCOSTATIN ) 100000 UNIT/ML suspension    Sig: Swish and spit 5-10ml four times daily until relief from oral symptoms of thrush    Dispense:  473 mL    Refill:  0

## 2023-07-22 DIAGNOSIS — F319 Bipolar disorder, unspecified: Secondary | ICD-10-CM | POA: Insufficient documentation

## 2023-07-22 DIAGNOSIS — T50906A Underdosing of unspecified drugs, medicaments and biological substances, initial encounter: Secondary | ICD-10-CM | POA: Insufficient documentation

## 2023-07-22 DIAGNOSIS — G4700 Insomnia, unspecified: Secondary | ICD-10-CM | POA: Insufficient documentation

## 2023-07-22 DIAGNOSIS — F17290 Nicotine dependence, other tobacco product, uncomplicated: Secondary | ICD-10-CM | POA: Insufficient documentation

## 2023-07-22 DIAGNOSIS — Z760 Encounter for issue of repeat prescription: Secondary | ICD-10-CM | POA: Insufficient documentation

## 2023-07-22 DIAGNOSIS — Z91128 Patient's intentional underdosing of medication regimen for other reason: Secondary | ICD-10-CM | POA: Insufficient documentation

## 2023-07-23 ENCOUNTER — Ambulatory Visit (HOSPITAL_COMMUNITY)
Admission: EM | Admit: 2023-07-23 | Discharge: 2023-07-23 | Disposition: A | Discharge status: Home / Self Care | Attending: Psychiatry | Admitting: Psychiatry

## 2023-07-23 DIAGNOSIS — Z91148 Patient's other noncompliance with medication regimen for other reason: Secondary | ICD-10-CM

## 2023-07-23 DIAGNOSIS — Z79899 Other long term (current) drug therapy: Secondary | ICD-10-CM

## 2023-07-23 NOTE — ED Provider Notes (Signed)
 Behavioral Health Urgent Care Medical Screening Exam  Patient Name: Jeffrey Chang MRN: [id number] Date of Evaluation: 07/23/23 Chief Complaint: need to restart medication    Diagnosis:  Final diagnoses:  Medication management  H/O medication noncompliance    History of Present illness: Jeffrey Chang is a 28 y.o. male. With a history of paranoid delusions, and hallucination., presented to [facility] voluntarily seeking medication evaluation.  According to pt he was hospitalized back in 2018 and was started on medication however patient stated he has not taken medicine since 2019.  A review of patient records show that patient was indeed hospitalized due to hallucination after smoking marijuana.  Tonight patient stated that his PCP was going to write him some medicine but the PCP moved a couple weeks ago.  Patient currently is not taking any medicines, patient lives with his family.  Currently employed.  Patient does not appear to be in any distress does not seem to be a threat to himself or others.  Face-to-face evaluation of patient, patient is alert and oriented x 4, speech is clear, maintaining eye contact.  Overall patient was pleasant.  Denies SI, HI, AVH or paranoia reported that he drinks alcohol daily like 1 beer a day.  Patient denies illicit drug use.  Reports he only smoke cigars.  Patient stated he wants to get back on his medication.  Writer discussed with patient that he needs to return to the walk-in psychiatry so they can do for an evaluation and to determine if he needs to restart medications.  Patient is advised to call 911 or return to the nearest ED should he experience suicidal ideation homicidal thoughts or hallucination.  Patient verbalized understanding.  At this present moment patient does not pose a risk to himself or others.   Recommend discharge for patient to follow up with walking psychiatry in the a.m.  Flowsheet Row ED from 07/23/2023 in [facility] from 04/20/2023 in [facility] ED from 04/05/2023 in [facility] Emergency Department at [facility]  C-SSRS RISK CATEGORY No Risk No Risk No Risk    Psychiatric Specialty Exam  Presentation  General Appearance:Casual  Eye Contact:Good  Speech:Clear and Coherent  Speech Volume:Normal  Handedness:Right   Mood and Affect  Mood: Anxious  Affect: Appropriate   Thought Process  Thought Processes: Coherent  Descriptions of Associations:Intact  Orientation:Full (Time, Place and Person)  Thought Content:Logical    Hallucinations:None  Ideas of Reference:None  Suicidal Thoughts:No  Homicidal Thoughts:No   Sensorium  Memory: Immediate Fair  Judgment: Fair  Insight: Fair   Art therapist  Concentration: Fair  Attention Span: Fair  Recall: Fair  Fund of Knowledge: Good  Language: Good   Psychomotor Activity  Psychomotor Activity: Normal   Assets  Assets: Desire for Improvement   Sleep  Sleep: Fair  Number of hours:  6   Physical Exam: Physical Exam HENT:     Head: Normocephalic.     Nose: Nose normal.   Eyes:     Pupils: Pupils are equal, round, and reactive to light.    Cardiovascular:     Rate and Rhythm: Normal rate.  Pulmonary:     Effort: Pulmonary effort is normal.   Musculoskeletal:        General: Normal range of motion.     Cervical back: Normal range of motion.   Neurological:     General: No focal deficit present.     Mental Status:  He is alert.   Psychiatric:        Mood and Affect: Mood normal.        Thought Content: Thought content normal.    Review of Systems  Constitutional: Negative.   HENT: Negative.    Eyes: Negative.   Respiratory: Negative.    Cardiovascular: Negative.   Gastrointestinal: Negative.   Genitourinary: Negative.   Musculoskeletal: Negative.   Skin: Negative.   Neurological: Negative.    Psychiatric/Behavioral:  The patient is nervous/anxious.    Blood pressure (!) 130/99, pulse (!) 103, temperature 97.6 F (36.4 C), temperature source Oral, resp. rate 20, SpO2 99%. There is no height or weight on file to calculate BMI.  Musculoskeletal: Strength & Muscle Tone: within normal limits Gait & Station: normal Patient leans: N/A   [facility] MSE Discharge Disposition for Follow up and Recommendations: Based on my evaluation the patient does not appear to have an emergency medical condition and can be discharged with resources and follow up care in outpatient services for Medication Management   Dorthea Gauze, NP 07/23/2023, 5:22 AM

## 2023-07-23 NOTE — Discharge Instructions (Addendum)
 F/u with walk-in clinic

## 2023-07-23 NOTE — Progress Notes (Signed)
   07/22/23 2348  [facility] Triage Screening (Walk-ins at [facility] only)  How Did You Hear About Us ? Self  What Is the Reason for Your Visit/Call Today? Pt presents to [facility] as a voluntary walk-in, unaccompanied requesting medication consultation. Pt reports diagnosis of Bipolar I and has recently been experiencing insomnia, lack of emotions, lack of mental energy overall does not feel like himself. Pt reports past psychiatric care in 2018,but has had treatment or medication since that time. Pt currently denies SI,HI,AVH and substance use.  How Long Has This Been Causing You Problems? <Week  Have You Recently Had Any Thoughts About Hurting Yourself? No  Are You Planning to Commit Suicide/Harm Yourself At This time? No  Have you Recently Had Thoughts About Hurting Someone Marigene Shoulder? No  Are You Planning To Harm Someone At This Time? No  Physical Abuse Denies  Verbal Abuse Denies  Sexual Abuse Denies  Exploitation of patient/patient's resources Denies  Self-Neglect Denies  Are you currently experiencing any auditory, visual or other hallucinations? No  Have You Used Any Alcohol or Drugs in the Past 24 Hours? Yes  What Did You Use and How Much? 2 beers about 6 hours ago  Do you have any current medical co-morbidities that require immediate attention? No  Clinician description of patient physical appearance/behavior: pt is calm, cooperative  What Do You Feel Would Help You the Most Today? Medication(s);Treatment for Depression or other mood problem  If access to [facility] was not available, would you have sought care in the Emergency Department? No  Determination of Need Routine (7 days)  Options For Referral Other: Comment;Outpatient Therapy;Medication Management

## 2023-07-25 ENCOUNTER — Telehealth: Admitting: Family

## 2023-07-25 DIAGNOSIS — B372 Candidiasis of skin and nail: Secondary | ICD-10-CM | POA: Diagnosis not present

## 2023-07-25 MED ORDER — NYSTATIN 100000 UNIT/GM EX POWD: 1.0000 | Freq: Three times a day (TID) | CUTANEOUS | 0 refills | Status: DC

## 2023-07-25 MED ORDER — CLOTRIMAZOLE 1 % EX CREA: 1.0000 | TOPICAL_CREAM | Freq: Two times a day (BID) | CUTANEOUS | 0 refills | Status: DC

## 2023-07-25 NOTE — Progress Notes (Signed)
 E-Visit for [employer]  We are sorry that you are not feeling well. Here is how we plan to help!  Based on what you shared with me it looks like you have tinea cruris, or "Jock Itch".  The symptoms of Jock Itch include red, peeling, itchy rash that affects the groin (crease where the leg meets the trunk).  This fungal infection can be spread through shared towels, clothing, bedding, or hard surfaces (particularly in moist areas) such as shower stalls, locker room floors, or pool area that has the fungus present. If you have a fungal infection on one part of your body, you can also spread it to other parts. For instance, men with a fungal infection on their feet sometimes spread it to their groin.  I am recommending:Clotrimazole  1% cream or gel, apply to area twice per day and nystatin  powder to use in the area four times a day.   HOME CARE:  Keep affected area clean, dry, and cool. Wash with soap and shampoo after sports or exercise and dry yourself well after bathing or swimming Wear cotton underwear and change them if they become damp or sweaty. Avoid using swimming pools, public showers, or baths.  GET HELP RIGHT AWAY IF:  Symptoms that don't away after treatment. Severe itching that persists. If your rash spreads or swells. If your rash begins to have drainage or smell. You develop a fever.  MAKE SURE YOU   Understand these instructions. Will watch your condition. Will get help right away if you are not doing well or get worse.  Thank you for choosing an e-visit.  Your e-visit answers were reviewed by a board certified advanced clinical practitioner to complete your personal care plan. Depending upon the condition, your plan could have included both over the counter or prescription medications.  Please review your pharmacy choice. Make sure the pharmacy is open so you can pick up prescription now. If there is a problem, you may contact your provider through [employer] and  have the prescription routed to another pharmacy.  Your safety is important to us . If you have drug allergies check your prescription carefully.   For the next 24 hours you can use MyChart to ask questions about today's visit, request a non-urgent call back, or ask for a work or school excuse. You will get an email in the next two days asking about your experience. I hope that your e-visit has been valuable and will speed your recovery.   References or for more information:  [email or web address]=search_result&selectedTitle=3~52&usage_type=default&display_rank=3   Approximately 5 minutes was spent documenting and reviewing patient's chart.

## 2023-07-28 ENCOUNTER — Emergency Department (HOSPITAL_BASED_OUTPATIENT_CLINIC_OR_DEPARTMENT_OTHER)
Admission: EM | Admit: 2023-07-28 | Discharge: 2023-07-28 | Disposition: A | Discharge status: Home / Self Care | Attending: Emergency Medicine | Admitting: Emergency Medicine

## 2023-07-28 ENCOUNTER — Other Ambulatory Visit: Payer: Self-pay

## 2023-07-28 ENCOUNTER — Encounter (HOSPITAL_BASED_OUTPATIENT_CLINIC_OR_DEPARTMENT_OTHER): Payer: Self-pay | Admitting: Emergency Medicine

## 2023-07-28 DIAGNOSIS — M542 Cervicalgia: Secondary | ICD-10-CM | POA: Diagnosis not present

## 2023-07-28 DIAGNOSIS — Z91040 Latex allergy status: Secondary | ICD-10-CM | POA: Insufficient documentation

## 2023-07-28 DIAGNOSIS — M545 Low back pain, unspecified: Secondary | ICD-10-CM | POA: Diagnosis present

## 2023-07-28 MED ORDER — LIDOCAINE 5 % EX PTCH
1.0000 | MEDICATED_PATCH | CUTANEOUS | Status: DC
  Administered 2023-07-28: 1 via TRANSDERMAL
  Filled 2023-07-28: qty 1

## 2023-07-28 MED ORDER — METHOCARBAMOL 500 MG PO TABS: 500.0000 mg | ORAL_TABLET | Freq: Two times a day (BID) | ORAL | 0 refills | Status: DC

## 2023-07-28 MED ORDER — TRAMADOL HCL 50 MG PO TABS: 50.0000 mg | ORAL_TABLET | Freq: Four times a day (QID) | ORAL | 0 refills | Status: DC | PRN

## 2023-07-28 MED ORDER — LIDOCAINE 5 % EX PTCH: 1.0000 | MEDICATED_PATCH | CUTANEOUS | 0 refills | Status: AC

## 2023-07-28 MED ORDER — TRAMADOL HCL 50 MG PO TABS
50.0000 mg | ORAL_TABLET | Freq: Once | ORAL | Status: AC
  Administered 2023-07-28: 50 mg via ORAL
  Filled 2023-07-28: qty 1

## 2023-07-28 NOTE — ED Triage Notes (Signed)
 Low back pain x 2 weeks  Sometimes goes into neck Report some numbness Denies incontinence Ambulatory with steady gait to triage

## 2023-07-28 NOTE — ED Provider Notes (Signed)
 [facility] EMERGENCY DEPARTMENT AT [facility] Provider Note   CSN: [id number] Arrival date & time: 07/28/23  1248     Patient presents with: Back Pain   Jeffrey Chang is a 28 y.o. male.  Patient with past medical history of bipolar 1 disorder presenting to emergency room with complaint of back pain.  He notes that he has had 2 weeks of bilateral low back pain that seems to be radiating into his neck.  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 Has had tingling sensation in bilateral hands and has been diagnosed to carpal tunnel no loss of sensation, or weakness. He denies any injury trauma or fall.  He has no saddle anesthesia, loss of bowel or bladder.  He has no fever.  Denies any history of IV drug use or prior history of cancer. Has already been on 5 days of steroids, mild improvement.      Back Pain      Prior to Admission medications   Medication Sig Start Date End Date Taking? Authorizing Provider  celecoxib  (CELEBREX ) 200 MG capsule Take 1 capsule (200 mg total) by mouth 2 (two) times daily with a meal. 05/01/23   Crain, Whitney L, PA  cetirizine  (ZYRTEC ) 10 MG tablet Take 1 tablet (10 mg total) by mouth daily. 07/16/23   [name], PA-C  clotrimazole  (LOTRIMIN ) 1 % cream Apply 1 Application topically 2 (two) times daily. 07/25/23   Tommas Fragmin A, FNP  EPINEPHrine  (EPIPEN  2-PAK) 0.3 mg/0.3 mL IJ SOAJ injection Inject 0.3 mg into the muscle as needed for anaphylaxis. 07/16/23   [name], PA-C  lidocaine (LIDODERM) 5 % Place 1 patch onto the skin daily. Remove & Discard patch within 12 hours or as directed by MD 07/28/23  Yes Cherylene Ferrufino N, PA-C  methocarbamol  (ROBAXIN ) 500 MG tablet Take 1 tablet (500 mg total) by mouth 2 (two) times daily. 07/28/23  Yes Khamryn Calderone, Kandace Organ, PA-C  nystatin  (MYCOSTATIN ) 100000 UNIT/ML suspension  Swish and spit 5-10ml four times daily until relief from oral symptoms of thrush 07/21/23   Mardene Shake, FNP  nystatin  (MYCOSTATIN /NYSTOP ) powder Apply 1 Application topically 3 (three) times daily. 07/25/23   Yevette Hem, FNP  traMADol  (ULTRAM ) 50 MG tablet Take 1 tablet (50 mg total) by mouth every 6 (six) hours as needed for severe pain (pain score 7-10). 07/28/23  Yes Markiya Keefe, Kandace Organ, PA-C    Allergies: Latex and Anusol -hc [hydrocortisone ]    Review of Systems  Musculoskeletal:  Positive for back pain.    Updated Vital Signs BP (!) 148/95 (BP Location: Right Arm)   Pulse 97   Temp 98.8 F (37.1 C) (Oral)   Resp 18   SpO2 99%   Physical Exam Vitals and nursing note reviewed.  Constitutional:      General: He is not in acute distress.    Appearance: He is not toxic-appearing.  HENT:     Head: Normocephalic and atraumatic.   Eyes:     General: No scleral icterus.    Conjunctiva/sclera: Conjunctivae normal.   Neck:     Comments: No cervical midline tenderness, step-off or deformity.  Normal range of motion.  No meningeal signs. Cardiovascular:     Rate and Rhythm: Normal rate and regular rhythm.     Pulses: Normal pulses.     Heart sounds: Normal  heart sounds.  Pulmonary:     Effort: Pulmonary effort is normal. No respiratory distress.     Breath sounds: Normal breath sounds.  Abdominal:     General: Abdomen is flat. Bowel sounds are normal.     Palpations: Abdomen is soft.     Tenderness: There is no abdominal tenderness.   Musculoskeletal:        General: Tenderness present.     Right lower leg: No edema.     Left lower leg: No edema.     Comments: Patient has mild tenderness to palpation over bilateral lumbar paraspinal musculature.  He has no lumbar midline tenderness step-off or deformity.   Skin:    General: Skin is warm and dry.     Findings: No lesion.   Neurological:     General: No focal deficit present.     Mental Status: He is alert and  oriented to person, place, and time. Mental status is at baseline.     (all labs ordered are listed, but only abnormal results are displayed) Labs Reviewed - No data to display  EKG: None  Radiology: No results found.   Procedures   Medications Ordered in the ED  traMADol  (ULTRAM ) tablet 50 mg (has no administration in time range)  lidocaine (LIDODERM) 5 % 1 patch (has no administration in time range)                                    Medical Decision Making Risk Prescription drug management.   Jeffrey Chang 28 y.o. presented today for back pain. Working Ddx: MSK in nature, fracture, epidural hematoma/abscess, cauda equina syndrome, spinal stenosis, spinal malignancy, discitis, spinal infection, spondylitises/ spondylosis, conus medullaris, DDD of the back.  R/o DDx: Cauda equina syndrome and additional dx are less likely than current impression due to history of present illness, physical exam, labs/imaging findings. No focal neurological deficits, no loss of bowel or bladder control.  Denies fever, night sweats, weight loss, h/o cancer, IVDU.   Problem List / ED Course / Critical interventions / Medication management  Patient reporting to emergency room with back pain.  He has no red flag symptoms associated with his back pain.  He has tried anti-inflammatories and recently given steroids at urgent care.  He is hemodynamically stable and well-appearing.  He ambulates with steady gait.  He has mild reproducible tenderness over erector spinae muscle bilaterality..  No midline tenderness.  He denies injury trauma or fall thus will not get imaging today. No indication for emergent MRI. Given duration, will have him follow with PCP.  Encouraged anti-inflammatories, Tylenol  lidocaine patch. I ordered medication including  Reevaluation of the patient after these medicines showed that the patient improved Patients vitals assessed. Upon arrival patient is hemodynamically stable.  I  have reviewed the patients home medicines and have made adjustments as needed  Plan: F/u w/ PCP in 2-3d to ensure resolution of sx.  RICE protocol and pain medicine discussed with patient.  Patient was given return precautions. Patient stable for discharge at this time.  Patient educated on sx/dx and verbalized understanding of plan. Return to ER with new or worsening sx.       Final diagnoses:  Acute bilateral low back pain without sciatica  Neck pain    ED Discharge Orders          Ordered    traMADol  (ULTRAM ) 50 MG tablet  Every 6 hours PRN        07/28/23 1423    methocarbamol  (ROBAXIN ) 500 MG tablet  2 times daily        07/28/23 1423    lidocaine (LIDODERM) 5 %  Every 24 hours        07/28/23 1423               Jeffrey Chang, Kandace Organ, PA-C 07/28/23 1435    Jeffrey Arias, MD 07/28/23 1452

## 2023-07-28 NOTE — Discharge Instructions (Addendum)
 I would recommend taking naproxen  once the morning, once in the evening.  You can take 1000 mg of Tylenol  every 8 hours. I would recommend lidocaine patch, ice and heat over the area of pain.  You can try Robaxin  for muscle spasm.  Use tramadol  for breakthrough pain.  The Robaxin  and tramadol  should not be used when driving or operating machinery.  Return to ER with new or worsening symptoms.

## 2023-07-29 ENCOUNTER — Telehealth: Admitting: Physician Assistant

## 2023-07-29 DIAGNOSIS — R109 Unspecified abdominal pain: Secondary | ICD-10-CM

## 2023-07-29 DIAGNOSIS — R509 Fever, unspecified: Secondary | ICD-10-CM | POA: Diagnosis not present

## 2023-07-29 NOTE — Patient Instructions (Signed)
  [name], thank you for joining Aminta Kales, PA-C for today's virtual visit.  While this provider is not your primary care provider (PCP), if your PCP is located in our provider database this encounter information will be shared with them immediately following your visit.   A [facility] MyChart account gives you access to today's visit and all your visits, tests, and labs performed at [facility]  click here if you don't have a [facility] MyChart account or go to [email or web address]  Consent: (Patient) Jeffrey Chang provided verbal consent for this virtual visit at the beginning of the encounter.  Current Medications:  Current Outpatient Medications:    celecoxib  (CELEBREX ) 200 MG capsule, Take 1 capsule (200 mg total) by mouth 2 (two) times daily with a meal., Disp: 60 capsule, Rfl: 0   cetirizine  (ZYRTEC ) 10 MG tablet, Take 1 tablet (10 mg total) by mouth daily., Disp: 30 tablet, Rfl: 0   clotrimazole  (LOTRIMIN ) 1 % cream, Apply 1 Application topically 2 (two) times daily., Disp: 30 g, Rfl: 0   EPINEPHrine  (EPIPEN  2-PAK) 0.3 mg/0.3 mL IJ SOAJ injection, Inject 0.3 mg into the muscle as needed for anaphylaxis., Disp: 1 each, Rfl: 0   lidocaine (LIDODERM) 5 %, Place 1 patch onto the skin daily. Remove & Discard patch within 12 hours or as directed by MD, Disp: 30 patch, Rfl: 0   methocarbamol  (ROBAXIN ) 500 MG tablet, Take 1 tablet (500 mg total) by mouth 2 (two) times daily., Disp: 20 tablet, Rfl: 0   nystatin  (MYCOSTATIN ) 100000 UNIT/ML suspension, Swish and spit 5-10ml four times daily until relief from oral symptoms of thrush, Disp: 473 mL, Rfl: 0   nystatin  (MYCOSTATIN /NYSTOP ) powder, Apply 1 Application topically 3 (three) times daily., Disp: 15 g, Rfl: 0   traMADol  (ULTRAM ) 50 MG tablet, Take 1 tablet (50 mg total) by mouth every 6 (six) hours as needed for severe pain (pain score 7-10)., Disp: 15 tablet, Rfl: 0   Medications ordered in this encounter:   No orders of the defined types were placed in this encounter.    *If you need refills on other medications prior to your next appointment, please contact your pharmacy*  Follow-Up: Call back or seek an in-person evaluation if the symptoms worsen or if the condition fails to improve as anticipated.  [facility] [phone]  Other Instructions You will need to be seen for an in person visit as soon as possible.   If you have been instructed to have an in-person evaluation today at a local Urgent Care facility, please use the link below. It will take you to a list of all of our available [facility], including address, phone number and hours of operation. Please do not delay care.  [facility]  If you or a family member do not have a primary care provider, use the link below to schedule a visit and establish care. When you choose a [facility] primary care physician or advanced practice provider, you gain a long-term partner in health. Find a Primary Care Provider  Learn more about [facility]'s in-office and virtual care options: [facility] - Get Care Now

## 2023-07-29 NOTE — Progress Notes (Signed)
 Mr. Jeffrey Chang, schlarb are scheduled for a virtual visit with your provider today.    Just as we do with appointments in the office, we must obtain your consent to participate.  Your consent will be active for this visit and any virtual visit you may have with one of our providers in the next 365 days.    If you have a MyChart account, I can also send a copy of this consent to you electronically.  All virtual visits are billed to your insurance company just like a traditional visit in the office.  As this is a virtual visit, video technology does not allow for your provider to perform a traditional examination.  This may limit your provider's ability to fully assess your condition.  If your provider identifies any concerns that need to be evaluated in person or the need to arrange testing such as labs, EKG, etc, we will make arrangements to do so.    Although advances in technology are sophisticated, we cannot ensure that it will always work on either your end or our end.  If the connection with a video visit is poor, we may have to switch to a telephone visit.  With either a video or telephone visit, we are not always able to ensure that we have a secure connection.   I need to obtain your verbal consent now.   Are you willing to proceed with your visit today?   Nickalos Garro has provided verbal consent on 07/29/2023 for a virtual visit (video or telephone).   Aminta Kales, PA-C 07/29/2023  5:45 PM   Date:  07/29/2023   ID:  Jeffrey Chang, DOB 04/01/1995, MRN [id number]  Patient Location: Home Provider Location: Home Office   Participants: Patient and Provider for Visit and Wrap up  Method of visit: Video  Location of Patient: Home Location of Provider: Home Office Consent was obtain for visit over the video. Services rendered by provider: Visit was performed via video  A video enabled telemedicine application was used and I verified that I am speaking with the correct person using two  identifiers.  PCP:  Mandy Second, PA   Chief Complaint:  flank pain  History of Present Illness:    Abdoul Encinas is a 28 y.o. male with history as stated below. Presents video telehealth for an acute care visit  Pt reports mid and right back pain that started 1 week ago. He reports associated fevers, nausea, dry heaving, urinary frequency, epigastric discomfort.  Denies hematuria, dysuria.  Past Medical, Surgical, Social History, Allergies, and Medications have been Reviewed.  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     Allergies:   Latex and Anusol -hc [hydrocortisone ]   ROS See HPI for history of present illness.  Physical Exam Abdominal:     Comments: Points to right flank as area of discomfort               MDM Pt presenting with right flank pain, fever and urinary sxs. Advised he needs an in person visit as soon as possible. He is agreeable to follow up.   Tests Ordered: No orders of the defined types were placed in this encounter.   Medication Changes: No orders  of the defined types were placed in this encounter.    Disposition:  Follow up  Signed, Aminta Kales, PA-C  07/29/2023 5:45 PM

## 2023-08-03 ENCOUNTER — Ambulatory Visit
Admission: RE | Admit: 2023-08-03 | Discharge: 2023-08-03 | Disposition: A | Discharge status: Home / Self Care | Source: Ambulatory Visit | Attending: Family Medicine | Admitting: Family Medicine

## 2023-08-03 VITALS — BP 124/83 | HR 80 | Temp 98.2°F | Resp 16

## 2023-08-03 DIAGNOSIS — R109 Unspecified abdominal pain: Secondary | ICD-10-CM | POA: Diagnosis not present

## 2023-08-03 DIAGNOSIS — R1084 Generalized abdominal pain: Secondary | ICD-10-CM | POA: Diagnosis not present

## 2023-08-03 LAB — POCT URINALYSIS DIP (MANUAL ENTRY)
Bilirubin, UA: NEGATIVE
Blood, UA: NEGATIVE
Glucose, UA: NEGATIVE mg/dL
Ketones, POC UA: NEGATIVE mg/dL
Leukocytes, UA: NEGATIVE
Nitrite, UA: NEGATIVE
Protein Ur, POC: NEGATIVE mg/dL
Spec Grav, UA: 1.02 (ref 1.010–1.025)
Urobilinogen, UA: 0.2 U/dL
pH, UA: 7.5 (ref 5.0–8.0)

## 2023-08-03 NOTE — ED Triage Notes (Signed)
 Pt c/o bil flank pain and abd pain for over 2 weeks  Also c/o urinary frequency

## 2023-08-03 NOTE — ED Provider Notes (Signed)
 [name]    CSN: [id number] Arrival date & time: 08/03/23  1350      History   Chief Complaint Chief Complaint  Patient presents with   Abdominal Pain    Entered by patient   Flank Pain    HPI Jeffrey Chang is a 28 y.o. male.    Abdominal Pain Flank Pain Associated symptoms include abdominal pain.  Here for flank pain and abdominal pain.  It has been bothering him for about 2 weeks.  It is in his lower thoracic or upper lumbar region.  Mostly lying down and sitting up can make it worse.  He also notes a little lower abdominal pain, mainly when he is urinating.  There is some urinary frequency.  No dysuria.  No hematuria Bowel movements have been normal.  Appetite is reduced but no vomiting or nausea recognized.  His temp has been 100 sometimes.  He is also maybe had some sweats.   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 No pertinent surgical history.     Home Medications    Prior to Admission medications   Medication Sig Start Date End Date Taking? Authorizing Provider  cetirizine  (ZYRTEC ) 10 MG tablet Take 1 tablet (10 mg total) by mouth daily. 07/16/23   Burnette, Jennifer M, PA-C  EPINEPHrine  (EPIPEN  2-PAK) 0.3 mg/0.3 mL IJ SOAJ injection Inject 0.3 mg into the muscle as needed for anaphylaxis. 07/16/23   Vivienne Delon HERO, PA-C  lidocaine  (LIDODERM ) 5 % Place 1 patch onto the skin daily. Remove & Discard patch within 12 hours or as directed by MD 07/28/23   Barrett, Jamie N, PA-C  methocarbamol  (ROBAXIN ) 500 MG tablet Take 1 tablet (500 mg total) by mouth 2 (two) times daily. 07/28/23   Barrett, Warren SAILOR, PA-C  nystatin  (MYCOSTATIN ) 100000 UNIT/ML suspension Swish and spit 5-10ml  four times daily until relief from oral symptoms of thrush 07/21/23   Kennyth Domino, FNP  nystatin  (MYCOSTATIN /NYSTOP ) powder Apply 1 Application topically 3 (three) times daily. 07/25/23   Lavell Bari LABOR, FNP    Family History Family History  Problem Relation Age of Onset   Rheum arthritis Mother    Rheum arthritis Father    Diabetes Brother     Social History Social History   Tobacco Use   Smoking status: Every Day    Types: Cigars   Smokeless tobacco: Never  Vaping Use   Vaping status: Never Used  Substance Use Topics   Alcohol use: Yes    Comment: Occassionally.   Drug use: Not Currently     Allergies   Latex and Anusol -hc [hydrocortisone ]   Review of Systems Review of Systems  Gastrointestinal:  Positive for abdominal pain.  Genitourinary:  Positive for flank pain.     Physical Exam Triage Vital Signs ED Triage Vitals  Encounter Vitals Group     BP 08/03/23 1431 124/83     Girls Systolic BP Percentile --      Girls Diastolic BP Percentile --      Boys Systolic BP Percentile --      Boys Diastolic BP Percentile --      Pulse Rate 08/03/23 1431 80  Resp 08/03/23 1431 16     Temp 08/03/23 1431 98.2 F (36.8 C)     Temp Source 08/03/23 1431 Oral     SpO2 08/03/23 1431 97 %     Weight --      Height --      Head Circumference --      Peak Flow --      Pain Score 08/03/23 1432 7     Pain Loc --      Pain Education --      Exclude from Growth Chart --    No data found.  Updated Vital Signs BP 124/83 (BP Location: Left Arm)   Pulse 80   Temp 98.2 F (36.8 C) (Oral)   Resp 16   SpO2 97%   Visual Acuity Right Eye Distance:   Left Eye Distance:   Bilateral Distance:    Right Eye Near:   Left Eye Near:    Bilateral Near:     Physical Exam Vitals reviewed.  Constitutional:      General: He is not in acute distress.    Appearance: He is not toxic-appearing.  HENT:     Right Ear: Tympanic membrane and ear canal normal.     Left Ear:  Tympanic membrane and ear canal normal.     Mouth/Throat:     Mouth: Mucous membranes are moist.     Pharynx: No oropharyngeal exudate or posterior oropharyngeal erythema.   Eyes:     Extraocular Movements: Extraocular movements intact.     Conjunctiva/sclera: Conjunctivae normal.     Pupils: Pupils are equal, round, and reactive to light.    Cardiovascular:     Rate and Rhythm: Normal rate and regular rhythm.     Heart sounds: No murmur heard. Pulmonary:     Effort: Pulmonary effort is normal.     Breath sounds: Normal breath sounds.  Abdominal:     Palpations: Abdomen is soft.     Comments: There are some generalized tenderness.  Bilaterally there is a little CVA tenderness.   Musculoskeletal:     Cervical back: Neck supple.  Lymphadenopathy:     Cervical: No cervical adenopathy.   Skin:    Capillary Refill: Capillary refill takes less than 2 seconds.     Coloration: Skin is not jaundiced or pale.   Neurological:     General: No focal deficit present.     Mental Status: He is alert and oriented to person, place, and time.   Psychiatric:        Behavior: Behavior normal.      UC Treatments / Results  Labs (all labs ordered are listed, but only abnormal results are displayed) Labs Reviewed  CBC WITH DIFFERENTIAL/PLATELET  COMPREHENSIVE METABOLIC PANEL WITH GFR  LIPASE  POCT URINALYSIS DIP (MANUAL ENTRY)    EKG   Radiology No results found.  Procedures Procedures (including critical care time)  Medications Ordered in UC Medications - No data to display  Initial Impression / Assessment and Plan / UC Course  I have reviewed the triage vital signs and the nursing notes.  Pertinent labs & imaging results that were available during my care of the patient were reviewed by me and considered in my medical decision making (see chart for details).     Urinalysis is negative   CBC and CMP are drawn to assess his symptoms.  Staff will notify him if there is  anything significantly abnormal  He states he does have a primary  care provider, and will set up a follow-up appointment with them about these issues.  Tylenol  or ibuprofen  is recommended for pain. Final Clinical Impressions(s) / UC Diagnoses   Final diagnoses:  Flank pain  Generalized abdominal pain     Discharge Instructions      The urinalysis was negative.  There is no sign of infection and there was no sugar or blood.  We have drawn blood to check your blood counts, kidney and liver function, and lipase, and enzyme from your pancreas.  If any of this is significantly abnormal, our staff will notify you.  There will be no phone call with all as normal.  Please follow-up with your primary care about this issue. Tylenol  or ibuprofen  as needed for pain.       ED Prescriptions   None    PDMP not reviewed this encounter.   Vonna Sharlet POUR, MD 08/03/23 [phone]

## 2023-08-03 NOTE — Discharge Instructions (Signed)
 The urinalysis was negative.  There is no sign of infection and there was no sugar or blood.  We have drawn blood to check your blood counts, kidney and liver function, and lipase, and enzyme from your pancreas.  If any of this is significantly abnormal, our staff will notify you.  There will be no phone call with all as normal.  Please follow-up with your primary care about this issue. Tylenol  or ibuprofen  as needed for pain.

## 2023-08-04 ENCOUNTER — Ambulatory Visit (HOSPITAL_COMMUNITY): Payer: Self-pay

## 2023-08-04 LAB — COMPREHENSIVE METABOLIC PANEL WITH GFR
ALT: 11 IU/L (ref 0–44)
AST: 26 IU/L (ref 0–40)
Albumin: 4.6 g/dL (ref 4.3–5.2)
Alkaline Phosphatase: 101 IU/L (ref 44–121)
BUN/Creatinine Ratio: 11 (ref 9–20)
BUN: 9 mg/dL (ref 6–20)
Bilirubin Total: 0.5 mg/dL (ref 0.0–1.2)
CO2: 20 mmol/L (ref 20–29)
Calcium: 9.5 mg/dL (ref 8.7–10.2)
Chloride: 102 mmol/L (ref 96–106)
Creatinine, Ser: 0.84 mg/dL (ref 0.76–1.27)
Globulin, Total: 2.5 g/dL (ref 1.5–4.5)
Glucose: 89 mg/dL (ref 70–99)
Potassium: 4.5 mmol/L (ref 3.5–5.2)
Sodium: 138 mmol/L (ref 134–144)
Total Protein: 7.1 g/dL (ref 6.0–8.5)
eGFR: 123 mL/min/{1.73_m2} (ref >59)

## 2023-08-04 LAB — CBC WITH DIFFERENTIAL/PLATELET
Basophils Absolute: 0.1 10*3/uL (ref 0.0–0.2)
Basos: 2 %
EOS (ABSOLUTE): 0.4 10*3/uL (ref 0.0–0.4)
Eos: 8 %
Hematocrit: 47.3 % (ref 37.5–51.0)
Hemoglobin: 15.3 g/dL (ref 13.0–17.7)
Immature Grans (Abs): 0 10*3/uL (ref 0.0–0.1)
Immature Granulocytes: 0 %
Lymphocytes Absolute: 1.9 10*3/uL (ref 0.7–3.1)
Lymphs: 39 %
MCH: 29.6 pg (ref 26.6–33.0)
MCHC: 32.3 g/dL (ref 31.5–35.7)
MCV: 92 fL (ref 79–97)
Monocytes Absolute: 0.4 10*3/uL (ref 0.1–0.9)
Monocytes: 8 %
Neutrophils Absolute: 2.2 10*3/uL (ref 1.4–7.0)
Neutrophils: 43 %
Platelets: 196 10*3/uL (ref 150–450)
RBC: 5.17 x10E6/uL (ref 4.14–5.80)
RDW: 13.8 % (ref 11.6–15.4)
WBC: 5 10*3/uL (ref 3.4–10.8)

## 2023-08-04 LAB — LIPASE: Lipase: 14 U/L (ref 13–78)

## 2023-08-11 ENCOUNTER — Other Ambulatory Visit: Payer: Self-pay

## 2023-08-11 ENCOUNTER — Emergency Department (HOSPITAL_BASED_OUTPATIENT_CLINIC_OR_DEPARTMENT_OTHER)

## 2023-08-11 ENCOUNTER — Emergency Department (HOSPITAL_BASED_OUTPATIENT_CLINIC_OR_DEPARTMENT_OTHER): Admission: EM | Admit: 2023-08-11 | Discharge: 2023-08-11 | Disposition: A | Discharge status: Home / Self Care

## 2023-08-11 DIAGNOSIS — R350 Frequency of micturition: Secondary | ICD-10-CM | POA: Insufficient documentation

## 2023-08-11 DIAGNOSIS — Z91040 Latex allergy status: Secondary | ICD-10-CM | POA: Insufficient documentation

## 2023-08-11 DIAGNOSIS — R1031 Right lower quadrant pain: Secondary | ICD-10-CM

## 2023-08-11 DIAGNOSIS — R110 Nausea: Secondary | ICD-10-CM | POA: Insufficient documentation

## 2023-08-11 DIAGNOSIS — R1084 Generalized abdominal pain: Secondary | ICD-10-CM | POA: Diagnosis present

## 2023-08-11 LAB — URINALYSIS, ROUTINE W REFLEX MICROSCOPIC
Bacteria, UA: NONE SEEN
Bilirubin Urine: NEGATIVE
Glucose, UA: NEGATIVE mg/dL
Hgb urine dipstick: NEGATIVE
Ketones, ur: NEGATIVE mg/dL
Leukocytes,Ua: NEGATIVE
Nitrite: NEGATIVE
Protein, ur: NEGATIVE mg/dL
Specific Gravity, Urine: 1.024 (ref 1.005–1.030)
pH: 7 (ref 5.0–8.0)

## 2023-08-11 LAB — CBC WITH DIFFERENTIAL/PLATELET
Abs Immature Granulocytes: 0.01 10*3/uL (ref 0.00–0.07)
Basophils Absolute: 0.1 10*3/uL (ref 0.0–0.1)
Basophils Relative: 1 %
Eosinophils Absolute: 0.5 10*3/uL (ref 0.0–0.5)
Eosinophils Relative: 6 %
HCT: 42.8 % (ref 39.0–52.0)
Hemoglobin: 14.4 g/dL (ref 13.0–17.0)
Immature Granulocytes: 0 %
Lymphocytes Relative: 32 %
Lymphs Abs: 2.6 10*3/uL (ref 0.7–4.0)
MCH: 29.3 pg (ref 26.0–34.0)
MCHC: 33.6 g/dL (ref 30.0–36.0)
MCV: 87 fL (ref 80.0–100.0)
Monocytes Absolute: 0.7 10*3/uL (ref 0.1–1.0)
Monocytes Relative: 9 %
Neutro Abs: 4.3 10*3/uL (ref 1.7–7.7)
Neutrophils Relative %: 52 %
Platelets: 198 10*3/uL (ref 150–400)
RBC: 4.92 MIL/uL (ref 4.22–5.81)
RDW: 14.8 % (ref 11.5–15.5)
WBC: 8.1 10*3/uL (ref 4.0–10.5)
nRBC: 0 % (ref 0.0–0.2)

## 2023-08-11 LAB — COMPREHENSIVE METABOLIC PANEL WITH GFR
ALT: 9 U/L (ref 0–44)
AST: 33 U/L (ref 15–41)
Albumin: 4.2 g/dL (ref 3.5–5.0)
Alkaline Phosphatase: 90 U/L (ref 38–126)
Anion gap: 10 (ref 5–15)
BUN: 11 mg/dL (ref 6–20)
CO2: 24 mmol/L (ref 22–32)
Calcium: 9.5 mg/dL (ref 8.9–10.3)
Chloride: 107 mmol/L (ref 98–111)
Creatinine, Ser: 1.15 mg/dL (ref 0.61–1.24)
GFR, Estimated: >60 mL/min (ref >60)
Glucose, Bld: 98 mg/dL (ref 70–99)
Potassium: 4.1 mmol/L (ref 3.5–5.1)
Sodium: 140 mmol/L (ref 135–145)
Total Bilirubin: 0.3 mg/dL (ref 0.0–1.2)
Total Protein: 7 g/dL (ref 6.5–8.1)

## 2023-08-11 LAB — LIPASE, BLOOD: Lipase: 15 U/L (ref 11–51)

## 2023-08-11 MED ORDER — ONDANSETRON HCL 4 MG PO TABS
4.0000 mg | ORAL_TABLET | Freq: Four times a day (QID) | ORAL | 0 refills | Status: DC
Start: 2023-08-11 — End: 2023-12-02

## 2023-08-11 MED ORDER — IOHEXOL 300 MG/ML  SOLN
100.0000 mL | Freq: Once | INTRAMUSCULAR | Status: AC | PRN
  Administered 2023-08-11: 100 mL via INTRAVENOUS

## 2023-08-11 MED ORDER — DICYCLOMINE HCL 20 MG PO TABS
20.0000 mg | ORAL_TABLET | Freq: Two times a day (BID) | ORAL | 0 refills | Status: DC
Start: 2023-08-11 — End: 2023-12-02

## 2023-08-11 MED ORDER — FENTANYL CITRATE PF 50 MCG/ML IJ SOSY
50.0000 ug | PREFILLED_SYRINGE | Freq: Once | INTRAMUSCULAR | Status: AC
  Administered 2023-08-11: 50 ug via INTRAVENOUS
  Filled 2023-08-11: qty 1

## 2023-08-11 MED ORDER — SODIUM CHLORIDE 0.9 % IV BOLUS
1000.0000 mL | Freq: Once | INTRAVENOUS | Status: AC
  Administered 2023-08-11: 1000 mL via INTRAVENOUS

## 2023-08-11 MED ORDER — ONDANSETRON HCL 4 MG/2ML IJ SOLN
4.0000 mg | Freq: Once | INTRAMUSCULAR | Status: AC
  Administered 2023-08-11: 4 mg via INTRAVENOUS
  Filled 2023-08-11: qty 2

## 2023-08-11 NOTE — ED Provider Notes (Signed)
 Patient transferred to me at time of shift change by Signe Servant, PA-C.  At time of shift change CT of abdomen/pelvis pending, UA pending.  Patient presents to the emergency department with a chief complaint of right lower quadrant abdominal pain.  Patient states that this pain has been ongoing for approximately 2 weeks with nausea.  He denies vomiting or diarrhea.  Patient went to urgent care last week but was only instructed to take Tylenol  and ibuprofen  for symptoms.  On physical exam today patient does have tenderness with palpation to generalized abdomen worse in the right lower quadrant.  Due to concern for appendicitis CT abdomen pelvis ordered by previous provider.  On my exam no obvious abnormality with auscultation of heart and lungs.  Generalized abdominal tenderness worse in the right lower quadrant.  No guarding, no rebound.  Lab workup in the emergency department today overall unremarkable, no white blood cell count, CBC and CMP unremarkable, UA shows no signs of infection  CT abdomen pelvis with contrast ordered and negative for acute intra-abdominal process.  No sign of appendicitis.  Patient instructed on workup today and results, patient states that he is overall feeling much better and is no longer in significant pain or nauseous.  Patient states that he would like some nausea medication to be sent home with.  Return precautions given.  Patient prescribed outpatient Bentyl  as well as Zofran , and given GI referral for follow-up.  Patient understands that he would need to call to make an appointment.  Patient discharged.   Based on negative workup today in the emergency department and physical exam, low clinical suspicion for acute life-threatening process for abdominal pain at this time.  Patient discharged with GI referral as well as outpatient therapy with return precautions.   Del Overfelt F, PA-C 08/11/23 2245    Neysa Caron PARAS, DO 08/11/23 2325

## 2023-08-11 NOTE — Discharge Instructions (Addendum)
 It was a pleasure to take care of you today. Based on your history, physical exam, labs, and imaging I feel you are safe for discharge.  You have been given a GI referral, please call as soon as possible to make an appointment for follow-up.  You have also been prescribed a medicine for abdominal cramping and nausea, please take as instructed.  Please return to the emergency department or seek further medical care if you experience any of the following symptoms including but not limited to fever, chills, severe abdominal pain, severe nausea/vomiting, blood in stool or vomit, weakness. If symptoms persist recommend follow-up with GI or primary care within 48 hours.

## 2023-08-11 NOTE — ED Provider Notes (Signed)
 [place] [facility] AT [facility] Provider Note   CSN: [id number] Arrival date & time: 08/11/23  2001     Patient presents with: Abdominal Pain   Jeffrey Chang is a 28 y.o. male patient with history of bipolar disorder who presents the emergency department today for further evaluation of diffuse abdominal pain this been present for the last 2 weeks.  Patient states the pain is poorly localized but it does hinder his activities of daily living.  He states is worse with certain movements.  It is not necessarily better with bowel movements although it is sometimes.  He reports associated intractable nausea without vomiting.  Denies diarrhea, fever, chills.  He also endorses some urinary symptoms including urinary frequency but he denies dysuria or urinary urgency. No abdominal surgeries.   Patient was seen evaluated urgent care for similar symptoms.  He had normal labs.  He was instructed to take Tylenol  and ibuprofen  for pain relief which did not improve his symptoms.    Abdominal Pain      Prior to Admission medications   Medication Sig Start Date End Date Taking? Authorizing Provider  cetirizine  (ZYRTEC ) 10 MG tablet Take 1 tablet (10 mg total) by mouth daily. 07/16/23   Burnette, Jennifer M, PA-C  EPINEPHrine  (EPIPEN  2-PAK) 0.3 mg/0.3 mL IJ SOAJ injection Inject 0.3 mg into the muscle as needed for anaphylaxis. 07/16/23   Vivienne Delon HERO, PA-C  lidocaine  (LIDODERM ) 5 % Place 1 patch onto the skin daily. Remove & Discard patch within 12 hours or as directed by MD 07/28/23   Barrett, Warren SAILOR, PA-C  methocarbamol  (ROBAXIN ) 500 MG tablet Take 1 tablet (500 mg total) by mouth 2 (two) times daily. 07/28/23   Barrett, Warren SAILOR, PA-C  nystatin  (MYCOSTATIN ) 100000 UNIT/ML suspension Swish and spit 5-10ml four times daily until relief from oral symptoms of thrush 07/21/23   Kennyth Domino, FNP  nystatin  (MYCOSTATIN /NYSTOP ) powder Apply 1 Application topically 3 (three) times  daily. 07/25/23   Lavell Bari LABOR, FNP    Allergies: Latex and Anusol -hc [hydrocortisone ]    Review of Systems  Gastrointestinal:  Positive for abdominal pain.  All other systems reviewed and are negative.   Updated Vital Signs BP (!) 150/87 (BP Location: Right Arm)   Pulse 84   Temp 98.6 F (37 C) (Oral)   Resp 16   Ht 5' 11 (1.803 m)   Wt 83.9 kg   SpO2 100%   BMI 25.80 kg/m   Physical Exam Vitals and nursing note reviewed.  Constitutional:      General: He is not in acute distress.    Appearance: Normal appearance.  HENT:     Head: Normocephalic and atraumatic.  Eyes:     General:        Right eye: No discharge.        Left eye: No discharge.  Cardiovascular:     Comments: Regular rate and rhythm.  S1/S2 are distinct without any evidence of murmur, rubs, or gallops.  Radial pulses are 2+ bilaterally.  Dorsalis pedis pulses are 2+ bilaterally.  No evidence of pedal edema. Pulmonary:     Comments: Clear to auscultation bilaterally.  Normal effort.  No respiratory distress.  No evidence of wheezes, rales, or rhonchi heard throughout. Abdominal:     General: Abdomen is flat. Bowel sounds are normal. There is no distension.     Tenderness: There is no guarding or rebound.     Comments: Patient has lower abdominal tenderness worse  on the right lower quadrant.  Musculoskeletal:        General: Normal range of motion.     Cervical back: Neck supple.  Skin:    General: Skin is warm and dry.     Findings: No rash.  Neurological:     General: No focal deficit present.     Mental Status: He is alert.  Psychiatric:        Mood and Affect: Mood normal.        Behavior: Behavior normal.     (all labs ordered are listed, but only abnormal results are displayed) Labs Reviewed  CBC WITH DIFFERENTIAL/PLATELET  COMPREHENSIVE METABOLIC PANEL WITH GFR  LIPASE, BLOOD  URINALYSIS, ROUTINE W REFLEX MICROSCOPIC    EKG: None  Radiology: No results found.   Procedures    Medications Ordered in the ED  fentaNYL (SUBLIMAZE) injection 50 mcg (50 mcg Intravenous Given 08/11/23 2102)  ondansetron  (ZOFRAN ) injection 4 mg (4 mg Intravenous Given 08/11/23 2100)  sodium chloride 0.9 % bolus 1,000 mL (1,000 mLs Intravenous New Bag/Given 08/11/23 2130)  iohexol (OMNIPAQUE) 300 MG/ML solution 100 mL (100 mLs Intravenous Contrast Given 08/11/23 2122)    Clinical Course as of 08/11/23 2146  Tue Aug 11, 2023  2100 CBC with Differential Negative.  [CF]  2112 Comprehensive metabolic panel Negative.  [CF]  2112 Lipase, blood Negative.  [CF]    Clinical Course User Index [CF] Jeffrey Chang HERO, PA-C    Medical Decision Making [name] is a 28 y.o. male patient who presents to the emergency department today for further evaluation of lower abdominal pain.  Patient is tender in the right lower quadrant.  Given that the patient has not had any abdominal surgeries and somewhat concerned for appendicitis today.  He is overall afebrile with normal vital signs.  I reviewed the lab work from urgent care and these were normal. Will proceed with a CT scan of the abdomen.   Most of his workup is negative.  UA and CT abdomen pelvis are still pending. Due to shift change, the rest of his care will be transferred to oncoming provider where ultimate disposition will be made.  If CT scan is negative, patient will likely be able to go home. Ultimate disposition is still pending.   Amount and/or Complexity of Data Reviewed Labs: ordered. Decision-making details documented in ED Course. Radiology: ordered.  Risk Prescription drug management.     Final diagnoses:  None    ED Discharge Orders     None          Jeffrey Chang HERO, [place] 08/11/23 2147    Neysa Caron PARAS, DO 08/11/23 2324

## 2023-08-11 NOTE — ED Notes (Signed)
 ED Provider at bedside.

## 2023-08-11 NOTE — ED Triage Notes (Signed)
 Pt POV from home d/t ABD pain all over for 2 weeks with Nausea.  Denies Emesis and diarrhea.  Pt went to Urgent Care last week but did not give him anything for it.

## 2023-08-31 ENCOUNTER — Ambulatory Visit: Admitting: Urgent Care

## 2023-10-01 ENCOUNTER — Telehealth

## 2023-10-14 ENCOUNTER — Telehealth: Admitting: Family Medicine

## 2023-10-14 DIAGNOSIS — B353 Tinea pedis: Secondary | ICD-10-CM | POA: Diagnosis not present

## 2023-10-14 DIAGNOSIS — B370 Candidal stomatitis: Secondary | ICD-10-CM

## 2023-10-14 MED ORDER — TERBINAFINE HCL 250 MG PO TABS: 250.0000 mg | ORAL_TABLET | Freq: Every day | ORAL | 0 refills | Status: AC

## 2023-10-14 MED ORDER — NYSTATIN 100000 UNIT/ML MT SUSP
5.0000 mL | Freq: Four times a day (QID) | OROMUCOSAL | 1 refills | Status: AC
Start: 2023-10-14 — End: 2023-10-24

## 2023-10-14 NOTE — Patient Instructions (Signed)
 Athlete's Foot Athlete's foot (tinea pedis) is a fungal infection of the skin on your feet. It often occurs on the skin that is between or underneath the toes. It can also occur on the soles of your feet. The infection can spread from person to person (is contagious). It can also spread when a person's bare feet come in contact with the fungus on shower floors or on items such as shoes. What are the causes? This condition is caused by a fungus that grows in warm, moist places. You can get athlete's foot by sharing shoes, shower stalls, towels, and wet floors with someone who is infected. Not washing your feet or changing your socks often enough can also lead to athlete's foot. What increases the risk? This condition is more likely to develop in: Men. People who have a weak body defense system (immune system). People who have diabetes. People who use public showers, such as at a gym. People who wear heavy-duty shoes, such as Youth worker. Seasons with warm, humid weather. What are the signs or symptoms? Symptoms of this condition include: Itchy areas between your toes or on the soles of your feet. White, flaky, or scaly areas between your toes or on the soles of your feet. Very itchy small blisters between your toes or on the soles of your feet. Small cuts in your skin. These cuts can become infected. Thick or discolored toenails. How is this diagnosed? This condition may be diagnosed with a physical exam and a review of your medical history. Your health care provider may also take a skin or toenail sample to examine under a microscope. How is this treated? This condition is treated with antifungal medicines. These may be applied as powders, ointments, or creams. In severe cases, an oral antifungal medicine may be given. Follow these instructions at home: Medicines Apply or take over-the-counter and prescription medicines only as told by your health care provider. Apply your  antifungal medicine as told by your health care provider. Do not stop using the antifungal even if your condition improves. Foot care Do not scratch your feet. Keep your feet dry: Wear cotton or wool socks. Change your socks every day or if they become wet. Wear shoes that allow air to flow, such as sandals or canvas tennis shoes. Wash and dry your feet, including the area between your toes. Also, wash and dry your feet: Every day or as told by your health care provider. After exercising. General instructions Do not let others use towels, shoes, nail clippers, or other personal items that touch your feet. Protect your feet by wearing sandals in wet areas, such as locker rooms and shared showers. Keep all follow-up visits. This is important. If you have diabetes, keep your blood sugar under control. Contact a health care provider if: You have a fever. You have swelling, soreness, warmth, or redness in your foot. Your feet are not getting better with treatment. Your symptoms get worse. You have new symptoms. You have severe pain. Summary Athlete's foot (tinea pedis) is a fungal infection of the skin on your feet. It often occurs on skin that is between or underneath the toes. This condition is caused by a fungus that grows in warm, moist places. Symptoms include white, flaky, or scaly areas between your toes or on the soles of your feet. This condition is treated with antifungal medicines. Keep your feet clean. Always dry them thoroughly. This information is not intended to replace advice given to you by  your health care provider. Make sure you discuss any questions you have with your health care provider. Document Revised: 05/20/2020 Document Reviewed: 05/20/2020 Elsevier Patient Education  2024 [employer].

## 2023-10-14 NOTE — Progress Notes (Signed)
 Virtual Visit Consent   Jeffrey Chang, you are scheduled for a virtual visit with a [facility] provider today. Just as with appointments in the office, your consent must be obtained to participate. Your consent will be active for this visit and any virtual visit you may have with one of our providers in the next 365 days. If you have a MyChart account, a copy of this consent can be sent to you electronically.  As this is a virtual visit, video technology does not allow for your provider to perform a traditional examination. This may limit your provider's ability to fully assess your condition. If your provider identifies any concerns that need to be evaluated in person or the need to arrange testing (such as labs, EKG, etc.), we will make arrangements to do so. Although advances in technology are sophisticated, we cannot ensure that it will always work on either your end or our end. If the connection with a video visit is poor, the visit may have to be switched to a telephone visit. With either a video or telephone visit, we are not always able to ensure that we have a secure connection.  By engaging in this virtual visit, you consent to the provision of healthcare and authorize for your insurance to be billed (if applicable) for the services provided during this visit. Depending on your insurance coverage, you may receive a charge related to this service.  I need to obtain your verbal consent now. Are you willing to proceed with your visit today? Jeffrey Chang has provided verbal consent on 10/14/2023 for a virtual visit (video or telephone). Loa Lamp, FNP  Date: 10/14/2023 5:30 PM   Virtual Visit via Video Note   I, Loa Lamp, connected with  Jeffrey Chang  ([id number], 1995-03-18) on 10/14/23 at  5:30 PM EDT by a video-enabled telemedicine application and verified that I am speaking with the correct person using two identifiers.  Location: Patient: Virtual Visit Location Patient:  Home Provider: Virtual Visit Location Provider: Home Office   I discussed the limitations of evaluation and management by telemedicine and the availability of in person appointments. The patient expressed understanding and agreed to proceed.    History of Present Illness: Jeffrey Chang is a 28 y.o. who identifies as a male who was assigned male at birth, and is being seen today for thrush and requests nystatin  and athletes foot and requests a pill given in the past other than diflucan . .  HPI: HPI  Problems:  Patient Active Problem List   Diagnosis Date Noted   Bipolar 1 disorder (HCC)     Allergies:  Allergies  Allergen Reactions   Latex Itching    Other Reaction(s): Other (See Comments)  REDNESS   Anusol -Hc [Hydrocortisone ] Itching and Rash   Medications:  Current Outpatient Medications:    cetirizine  (ZYRTEC ) 10 MG tablet, Take 1 tablet (10 mg total) by mouth daily., Disp: 30 tablet, Rfl: 0   dicyclomine  (BENTYL ) 20 MG tablet, Take 1 tablet (20 mg total) by mouth 2 (two) times daily., Disp: 20 tablet, Rfl: 0   EPINEPHrine  (EPIPEN  2-PAK) 0.3 mg/0.3 mL IJ SOAJ injection, Inject 0.3 mg into the muscle as needed for anaphylaxis., Disp: 1 each, Rfl: 0   lidocaine  (LIDODERM ) 5 %, Place 1 patch onto the skin daily. Remove & Discard patch within 12 hours or as directed by MD, Disp: 30 patch, Rfl: 0   methocarbamol  (ROBAXIN ) 500 MG tablet, Take 1 tablet (500 mg total) by mouth 2 (two)  times daily., Disp: 20 tablet, Rfl: 0   nystatin  (MYCOSTATIN ) 100000 UNIT/ML suspension, Swish and spit 5-10ml four times daily until relief from oral symptoms of thrush, Disp: 473 mL, Rfl: 0   nystatin  (MYCOSTATIN /NYSTOP ) powder, Apply 1 Application topically 3 (three) times daily., Disp: 15 g, Rfl: 0   ondansetron  (ZOFRAN ) 4 MG tablet, Take 1 tablet (4 mg total) by mouth every 6 (six) hours., Disp: 12 tablet, Rfl: 0  Observations/Objective: Patient is well-developed, well-nourished in no acute distress.   Resting comfortably  at home.  Head is normocephalic, atraumatic.  No labored breathing.  Speech is clear and coherent with logical content.  Patient is alert and oriented at baseline.    Assessment and Plan: 1. Oral thrush (Primary)  Prevention discussed, rtc as needed.   Follow Up Instructions: I discussed the assessment and treatment plan with the patient. The patient was provided an opportunity to ask questions and all were answered. The patient agreed with the plan and demonstrated an understanding of the instructions.  A copy of instructions were sent to the patient via MyChart unless otherwise noted below.     The patient was advised to call back or seek an in-person evaluation if the symptoms worsen or if the condition fails to improve as anticipated.    Jeffrey Glasco, FNP

## 2023-11-09 ENCOUNTER — Encounter: Admitting: Urgent Care

## 2023-12-02 ENCOUNTER — Encounter: Admitting: Family Medicine

## 2023-12-02 ENCOUNTER — Telehealth: Admitting: Physician Assistant

## 2023-12-02 ENCOUNTER — Telehealth: Admitting: Family Medicine

## 2023-12-02 DIAGNOSIS — R0602 Shortness of breath: Secondary | ICD-10-CM

## 2023-12-02 DIAGNOSIS — T7840XA Allergy, unspecified, initial encounter: Secondary | ICD-10-CM | POA: Diagnosis not present

## 2023-12-02 DIAGNOSIS — Z91040 Latex allergy status: Secondary | ICD-10-CM

## 2023-12-02 NOTE — Patient Instructions (Signed)
  Jeffrey Chang, thank you for joining Jeffrey Velma Lunger, PA-C for today's virtual visit.  While this provider is not your primary care provider (PCP), if your PCP is located in our provider database this encounter information will be shared with them immediately following your visit.   A [facility] MyChart account gives you access to today's visit and all your visits, tests, and labs performed at [facility]  click here if you don't have a [facility] MyChart account or go to [email or web address]  Consent: (Patient) Jeffrey Chang provided verbal consent for this virtual visit at the beginning of the encounter.  Current Medications:  Current Outpatient Medications:    EPINEPHrine  (EPIPEN  2-PAK) 0.3 mg/0.3 mL IJ SOAJ injection, Inject 0.3 mg into the muscle as needed for anaphylaxis., Disp: 1 each, Rfl: 0   lidocaine  (LIDODERM ) 5 %, Place 1 patch onto the skin daily. Remove & Discard patch within 12 hours or as directed by MD, Disp: 30 patch, Rfl: 0   Medications ordered in this encounter:  No orders of the defined types were placed in this encounter.    *If you need refills on other medications prior to your next appointment, please contact your pharmacy*  Follow-Up: Call back or seek an in-person evaluation if the symptoms worsen or if the condition fails to improve as anticipated.  [facility] [phone]  Other Instructions Hydrate and rest. Cool compresses as discussed. Switch from Zyrtec  (cetrizine) to Benadryl  (Diphenhydramine ). Take the Sterapred pack as directed. Make sure to keep your Epi Pen with you. If you note any non-resolving, new, or worsening symptoms despite treatment, please seek an in-person evaluation ASAP.    If you have been instructed to have an in-person evaluation today at a local Urgent Care facility, please use the link below. It will take you to a list of all of our available [facility], including  address, phone number and hours of operation. Please do not delay care.  [facility]  If you or a family member do not have a primary care provider, use the link below to schedule a visit and establish care. When you choose a [facility] primary care physician or advanced practice provider, you gain a long-term partner in health. Find a Primary Care Provider  Learn more about [facility]'s in-office and virtual care options:  - Get Care Now

## 2023-12-02 NOTE — Progress Notes (Signed)
 Virtual Visit Consent   [name], you are scheduled for a virtual visit with a [facility] provider today. Just as with appointments in the office, your consent must be obtained to participate. Your consent will be active for this visit and any virtual visit you may have with one of our providers in the next 365 days. If you have a MyChart account, a copy of this consent can be sent to you electronically.  As this is a virtual visit, video technology does not allow for your provider to perform a traditional examination. This may limit your provider's ability to fully assess your condition. If your provider identifies any concerns that need to be evaluated in person or the need to arrange testing (such as labs, EKG, etc.), we will make arrangements to do so. Although advances in technology are sophisticated, we cannot ensure that it will always work on either your end or our end. If the connection with a video visit is poor, the visit may have to be switched to a telephone visit. With either a video or telephone visit, we are not always able to ensure that we have a secure connection.  By engaging in this virtual visit, you consent to the provision of healthcare and authorize for your insurance to be billed (if applicable) for the services provided during this visit. Depending on your insurance coverage, you may receive a charge related to this service.  I need to obtain your verbal consent now. Are you willing to proceed with your visit today? Jeffrey Chang has provided verbal consent on 12/02/2023 for a virtual visit (video or telephone). Jeffrey Chang, [place]  Date: 12/02/2023 2:37 PM   Virtual Visit via Video Note   I, Jeffrey Chang, connected with  Bryndan Bilyk  ([id number], 1995-03-14) on 12/02/23 at  2:30 PM EDT by a video-enabled telemedicine application and verified that I am speaking with the correct person using two identifiers.  Location: Patient: Virtual Visit  Location Patient: Home Provider: Virtual Visit Location Provider: Home Office   I discussed the limitations of evaluation and management by telemedicine and the availability of in person appointments. The patient expressed understanding and agreed to proceed.    History of Present Illness: Jeffrey Chang is a 28 y.o. who identifies as a male who was assigned male at birth, and is being seen today for concern for allergic reaction. Patient with history of latex allergy notes last Friday (5.5 days ago) he was eating at cafeteria where workers were using gloves. Noted after eating having lip swelling, facial itching and hives. Some initial shortness of breath but did not seek care or use Epi Pen as he had his young daughter. Took OTC Zyrtec  with some improvement. Notes residual itching of face and lips, as well as inside the mouth.   HPI: HPI  Problems:  Patient Active Problem List   Diagnosis Date Noted   Bipolar 1 disorder (HCC)     Allergies:  Allergies  Allergen Reactions   Latex Itching    Other Reaction(s): Other (See Comments)  REDNESS   Anusol -Hc [Hydrocortisone ] Itching and Rash   Medications:  Current Outpatient Medications:    EPINEPHrine  (EPIPEN  2-PAK) 0.3 mg/0.3 mL IJ SOAJ injection, Inject 0.3 mg into the muscle as needed for anaphylaxis., Disp: 1 each, Rfl: 0   lidocaine  (LIDODERM ) 5 %, Place 1 patch onto the skin daily. Remove & Discard patch within 12 hours or as directed by MD, Disp: 30 patch, Rfl: 0  Observations/Objective: Patient  is well-developed, well-nourished in no acute distress.  Resting comfortably  at home.  Head is normocephalic, atraumatic.  No labored breathing.  Speech is clear and coherent with logical content.  Patient is alert and oriented at baseline.  No noted facial edema or rash. Lips are within normal limits. He is talking without any issue, while walking during the interview without any noted shortness of breath.  Assessment and Plan: 1.  Allergic reaction, initial encounter (Primary)  Thankfully resolving on its own. No residual alarm signs or symptoms. Did discuss with him the nature of allergies and subsequent exposures with more robust reactions. He is to keep Epi Pen on him when possible. ER for any further episode like this. For now, will have him hydrate. Cool compresses. Switch from Zyrtec  to OTC benadryl . Will give Sterapred pack to ensure quicker resolution.  Follow Up Instructions: I discussed the assessment and treatment plan with the patient. The patient was provided an opportunity to ask questions and all were answered. The patient agreed with the plan and demonstrated an understanding of the instructions.  A copy of instructions were sent to the patient via MyChart unless otherwise noted below.   The patient was advised to call back or seek an in-person evaluation if the symptoms worsen or if the condition fails to improve as anticipated.    Jeffrey Velma Lunger, PA-C

## 2023-12-02 NOTE — Progress Notes (Signed)
  Because you are having a strong allergic reaction- you need to be seen in person to get faster relief. We feel your condition warrants further evaluation and we recommend that you be seen in a face-to-face visit at your PCP office or local Urgent Care.   NOTE: There will be NO CHARGE for this E-Visit   If you are having a true medical emergency, please call 911.

## 2023-12-02 NOTE — Progress Notes (Signed)
  error

## 2023-12-03 ENCOUNTER — Telehealth: Admitting: Family Medicine

## 2023-12-03 DIAGNOSIS — T65811A Toxic effect of latex, accidental (unintentional), initial encounter: Secondary | ICD-10-CM | POA: Diagnosis not present

## 2023-12-03 MED ORDER — PREDNISONE 20 MG PO TABS: 20.0000 mg | ORAL_TABLET | Freq: Two times a day (BID) | ORAL | 0 refills | Status: AC

## 2023-12-03 NOTE — Patient Instructions (Signed)
 Drug Allergy: What to Know A drug allergy is when your body reacts in a bad way to a medicine. The reaction may be mild or very bad. In some cases, it can be life-threatening. Symptoms of a reaction often happen 1 to 2 hours after taking the medicine. But they can sometimes take a week or more to appear. If you have an allergic reaction, get help right away. You should get help even if the reaction seems mild. What are the causes? A drug allergy is caused by a reaction in your body's defense system, also called the immune system. It causes your body to release substances that can cause swelling. This can lead to reduced blood flow in parts of the body. What are the signs or symptoms? Symptoms of a mild reaction Swelling of the sinuses. These are spaces behind the bones of your face and forehead. Tingling in your mouth. An itchy, red rash. Symptoms of a very bad reaction Swelling of your face, lips, tongue, or mouth. You may also have: Swelling of the back of the throat. Trouble talking or swallowing. Making high-pitched whistling sounds when you breathe. This is called wheezing. Hives. These are itchy, red, swollen areas of skin. Feeling dizzy or fainting. Confusion. A tight feeling in your chest. Fast or uneven heartbeats. Pain in your belly. Throwing up or having watery poop (diarrhea). How is this treated? There's no cure for allergies. Very bad reactions need to be treated right away in a hospital. An allergic reaction can be treated with: Medicines to help your symptoms. Inhalers. These are medicines that help open the airways in your lungs. A shot for a very bad allergic reaction. This medicine is called epinephrine. Your doctor may teach you how to use an allergy kit and how to give yourself a shot. You may be given an auto-injector pen. This is a device filled with medicine that's used to give a shot. Follow these instructions at home: If you have a very bad allergy:  Always  keep an allergy pen or your kit with you. This could save your life. Use it as told by your doctor. Replace your allergy pen or kit right away after using it. Wear a medical alert bracelet or necklace that shows you have a drug allergy. Make sure that you, the people who live with you, and your employer know how to use your allergy pen or kit. General instructions Avoid medicines that you're allergic to. Take medicines only as told. If you were given medicines to treat an allergic reaction, do not drive until your doctor tells you it's safe. If you have hives or a rash: Use over-the-counter medicines as told by your doctor. Put cold, wet cloths on your skin. Take baths or showers in cool water. Avoid hot water. If you had tests done, ask when your results will be ready and how to get them. You may need to call or meet with your doctor to get your results. Tell all your doctors that you have a medicine allergy. Also tell your family members or other people who live with you. Contact a doctor if: You think you're having a mild allergic reaction. You have symptoms that last more than 2 days after your reaction. You get new symptoms. Get help right away if: You had to use your allergy pen or kit. You have symptoms of a very bad allergic reaction. These symptoms may be an emergency. Use your allergy pen or kit as you've been told. Call 911  right away. Do not wait to see if the symptoms will go away. Do not drive yourself to the hospital. This information is not intended to replace advice given to you by your health care provider. Make sure you discuss any questions you have with your health care provider. Document Revised: 09/30/2022 Document Reviewed: 09/30/2022 Elsevier Patient Education  2024 [employer].

## 2023-12-03 NOTE — Progress Notes (Signed)
 Virtual Visit Consent   Jeffrey Chang, you are scheduled for a virtual visit with a [facility] provider today. Just as with appointments in the office, your consent must be obtained to participate. Your consent will be active for this visit and any virtual visit you may have with one of our providers in the next 365 days. If you have a MyChart account, a copy of this consent can be sent to you electronically.  As this is a virtual visit, video technology does not allow for your provider to perform a traditional examination. This may limit your provider's ability to fully assess your condition. If your provider identifies any concerns that need to be evaluated in person or the need to arrange testing (such as labs, EKG, etc.), we will make arrangements to do so. Although advances in technology are sophisticated, we cannot ensure that it will always work on either your end or our end. If the connection with a video visit is poor, the visit may have to be switched to a telephone visit. With either a video or telephone visit, we are not always able to ensure that we have a secure connection.  By engaging in this virtual visit, you consent to the provision of healthcare and authorize for your insurance to be billed (if applicable) for the services provided during this visit. Depending on your insurance coverage, you may receive a charge related to this service.  I need to obtain your verbal consent now. Are you willing to proceed with your visit today? Jeffrey Chang has provided verbal consent on 12/03/2023 for a virtual visit (video or telephone). Loa Lamp, FNP  Date: 12/03/2023 6:23 PM   Virtual Visit via Video Note   I, Loa Lamp, connected with  Jeffrey Chang  ([id number], 03/12/1995) on 12/03/23 at  6:30 PM EDT by a video-enabled telemedicine application and verified that I am speaking with the correct person using two identifiers.  Location: Patient: Virtual Visit Location Patient:  Home Provider: Virtual Visit Location Provider: Home Office   I discussed the limitations of evaluation and management by telemedicine and the availability of in person appointments. The patient expressed understanding and agreed to proceed.    History of Present Illness: Jeffrey Chang is a 28 y.o. who identifies as a male who was assigned male at birth, and is being seen today for allergic reaction. He reports prednisone  discussed at last visit was not sent. He would like it sent. He says he had hydrocortisone  cream in the past but used it with another cream and thought it made the rash worse but is able to take prednisone  without problems. SABRA  HPI: HPI  Problems:  Patient Active Problem List   Diagnosis Date Noted   Bipolar 1 disorder (HCC)     Allergies:  Allergies  Allergen Reactions   Latex Itching    Other Reaction(s): Other (See Comments)  REDNESS   Anusol -Hc [Hydrocortisone ] Itching and Rash   Medications:  Current Outpatient Medications:    EPINEPHrine  (EPIPEN  2-PAK) 0.3 mg/0.3 mL IJ SOAJ injection, Inject 0.3 mg into the muscle as needed for anaphylaxis., Disp: 1 each, Rfl: 0   lidocaine  (LIDODERM ) 5 %, Place 1 patch onto the skin daily. Remove & Discard patch within 12 hours or as directed by MD, Disp: 30 patch, Rfl: 0  Observations/Objective: Patient is well-developed, well-nourished in no acute distress.  Resting comfortably  at home.  Head is normocephalic, atraumatic.  No labored breathing.  Speech is clear and coherent with logical  content.  Patient is alert and oriented at baseline.    Assessment and Plan: There are no diagnoses linked to this encounter. UC if sx persist or worsen. OTC allegra.   Follow Up Instructions: I discussed the assessment and treatment plan with the patient. The patient was provided an opportunity to ask questions and all were answered. The patient agreed with the plan and demonstrated an understanding of the instructions.  A copy  of instructions were sent to the patient via MyChart unless otherwise noted below.     The patient was advised to call back or seek an in-person evaluation if the symptoms worsen or if the condition fails to improve as anticipated.    [name], FNP

## 2023-12-13 ENCOUNTER — Telehealth: Admitting: Family

## 2023-12-13 DIAGNOSIS — R42 Dizziness and giddiness: Secondary | ICD-10-CM

## 2023-12-13 MED ORDER — MECLIZINE HCL 25 MG PO TABS: 25.0000 mg | ORAL_TABLET | Freq: Three times a day (TID) | ORAL | 0 refills | Status: AC | PRN

## 2023-12-13 MED ORDER — ONDANSETRON HCL 4 MG PO TABS: 4.0000 mg | ORAL_TABLET | Freq: Three times a day (TID) | ORAL | 0 refills | Status: AC | PRN

## 2023-12-13 NOTE — Patient Instructions (Signed)
 Vertigo Vertigo is the feeling that you or the things around you are moving or spinning when they're not. It's different than feeling dizzy. It can also cause: Loss of balance. Trouble standing or walking. Nausea and vomiting. This feeling can come and go at any time. It can last from a few seconds to minutes or even hours. It may go away on its own or be treated with medicine. What are the types of vertigo? There are two types of vertigo: Peripheral vertigo happens when parts of your inner ear don't work like they should. This is the more common type. Central vertigo happens when your brain and spinal cord don't work like they should. Your health care provider will do tests to find out what kind of vertigo you have. This will help them decide on the right treatment for you. Follow these instructions at home: Eating and drinking Drink enough fluid to keep your pee (urine) pale yellow. Do not drink alcohol. Activity When you get up in the morning, first sit up on the side of the bed. When you feel okay, stand slowly while holding onto something. Move slowly. Avoid sudden body or head movements. Avoid certain positions, as told by your provider. Use a cane if you have trouble standing or walking. Sit down right away if you feel unsteady. Place items in your home so they're easy for you to reach without bending or leaning over. Return to normal activities when you're told. Ask what things are safe for you to do. General instructions Take your medicines only as told by your provider. Contact a health care provider if: Your medicines don't help or make your vertigo worse. You get new symptoms. You have a fever. You have nausea or vomiting. Your family or friends spot any changes in how you're acting. A part of your body goes numb. You feel tingling and prickling in a part of your body. You get very bad headaches. Get help right away if: You're always dizzy or you faint. You have a  stiff neck. You have trouble moving or speaking. Your hands, arms, or legs feel weak. Your hearing or eyesight changes. These symptoms may be an emergency. Call 911 right away. Do not wait to see if the symptoms will go away. Do not drive yourself to the hospital. This information is not intended to replace advice given to you by your health care provider. Make sure you discuss any questions you have with your health care provider. Document Revised: 10/30/2022 Document Reviewed: 05/02/2022 Elsevier Patient Education  2024 [employer].

## 2023-12-13 NOTE — Progress Notes (Signed)
 Virtual Visit Consent   Jeffrey Chang, you are scheduled for a virtual visit with a [facility] provider today. Just as with appointments in the office, your consent must be obtained to participate. Your consent will be active for this visit and any virtual visit you may have with one of our providers in the next 365 days. If you have a MyChart account, a copy of this consent can be sent to you electronically.  As this is a virtual visit, video technology does not allow for your provider to perform a traditional examination. This may limit your provider's ability to fully assess your condition. If your provider identifies any concerns that need to be evaluated in person or the need to arrange testing (such as labs, EKG, etc.), we will make arrangements to do so. Although advances in technology are sophisticated, we cannot ensure that it will always work on either your end or our end. If the connection with a video visit is poor, the visit may have to be switched to a telephone visit. With either a video or telephone visit, we are not always able to ensure that we have a secure connection.  By engaging in this virtual visit, you consent to the provision of healthcare and authorize for your insurance to be billed (if applicable) for the services provided during this visit. Depending on your insurance coverage, you may receive a charge related to this service.  I need to obtain your verbal consent now. Are you willing to proceed with your visit today? Marquinn Hartmann has provided verbal consent on 12/13/2023 for a virtual visit (video or telephone). Bari Learn, FNP  Date: 12/13/2023 5:18 PM   Virtual Visit via Video Note   I, Bari Learn, connected with  Edwing Figley  ([id number], November 26, 1995) on 12/13/23 at  5:15 PM EST by a video-enabled telemedicine application and verified that I am speaking with the correct person using two identifiers.  Location: Patient: Virtual Visit Location Patient:  Mobile Provider: Virtual Visit Location Provider: Home Office   I discussed the limitations of evaluation and management by telemedicine and the availability of in person appointments. The patient expressed understanding and agreed to proceed.    History of Present Illness: Jeffrey Chang is a 28 y.o. who identifies as a male who was assigned male at birth, and is being seen today for vertigo.  HPI: Dizziness This is a new problem. The current episode started in the past 7 days. The problem occurs constantly. The problem has been waxing and waning. Associated symptoms include headaches, nausea and vertigo. Pertinent negatives include no arthralgias, change in bowel habit, chest pain, chills, congestion, coughing, diaphoresis, fatigue, fever, sore throat, swollen glands, urinary symptoms or vomiting. The symptoms are aggravated by bending.    Problems:  Patient Active Problem List   Diagnosis Date Noted   Bipolar 1 disorder (HCC)     Allergies:  Allergies  Allergen Reactions   Latex Itching    Other Reaction(s): Other (See Comments)  REDNESS   Anusol -Hc [Hydrocortisone ] Itching and Rash   Medications:  Current Outpatient Medications:    meclizine (ANTIVERT) 25 MG tablet, Take 1 tablet (25 mg total) by mouth 3 (three) times daily as needed for dizziness., Disp: 30 tablet, Rfl: 0   ondansetron  (ZOFRAN ) 4 MG tablet, Take 1 tablet (4 mg total) by mouth every 8 (eight) hours as needed for nausea or vomiting., Disp: 20 tablet, Rfl: 0   EPINEPHrine  (EPIPEN  2-PAK) 0.3 mg/0.3 mL IJ SOAJ injection, Inject 0.3  mg into the muscle as needed for anaphylaxis., Disp: 1 each, Rfl: 0   lidocaine  (LIDODERM ) 5 %, Place 1 patch onto the skin daily. Remove & Discard patch within 12 hours or as directed by MD, Disp: 30 patch, Rfl: 0  Observations/Objective: Patient is well-developed, well-nourished in no acute distress.  Resting comfortably   Head is normocephalic, atraumatic.  No labored breathing.   Speech is clear and coherent with logical content.  Patient is alert and oriented at baseline.    Assessment and Plan: 1. Vertigo (Primary) - meclizine (ANTIVERT) 25 MG tablet; Take 1 tablet (25 mg total) by mouth 3 (three) times daily as needed for dizziness.  Dispense: 30 tablet; Refill: 0 - ondansetron  (ZOFRAN ) 4 MG tablet; Take 1 tablet (4 mg total) by mouth every 8 (eight) hours as needed for nausea or vomiting.  Dispense: 20 tablet; Refill: 0  Start antivert TID prn  Zofran  prn  Avoid fall precautions  Epley exercises discussed, will YouTube Avoid fast position changes  Follow Up Instructions: I discussed the assessment and treatment plan with the patient. The patient was provided an opportunity to ask questions and all were answered. The patient agreed with the plan and demonstrated an understanding of the instructions.  A copy of instructions were sent to the patient via MyChart unless otherwise noted below.     The patient was advised to call back or seek an in-person evaluation if the symptoms worsen or if the condition fails to improve as anticipated.    Bari Learn, FNP

## 2023-12-22 ENCOUNTER — Other Ambulatory Visit: Payer: Self-pay

## 2023-12-22 ENCOUNTER — Ambulatory Visit
Admission: RE | Admit: 2023-12-22 | Discharge: 2023-12-22 | Disposition: A | Discharge status: Home / Self Care | Source: Ambulatory Visit | Attending: Family Medicine | Admitting: Family Medicine

## 2023-12-22 VITALS — BP 125/86 | HR 88 | Temp 98.1°F | Resp 16

## 2023-12-22 DIAGNOSIS — J069 Acute upper respiratory infection, unspecified: Secondary | ICD-10-CM

## 2023-12-22 DIAGNOSIS — J029 Acute pharyngitis, unspecified: Secondary | ICD-10-CM | POA: Diagnosis not present

## 2023-12-22 LAB — POCT RAPID STREP A (OFFICE): Rapid Strep A Screen: NEGATIVE

## 2023-12-22 MED ORDER — PROMETHAZINE-DM 6.25-15 MG/5ML PO SYRP: 5.0000 mL | ORAL_SOLUTION | Freq: Every evening | ORAL | 0 refills | Status: AC | PRN

## 2023-12-22 MED ORDER — BENZONATATE 200 MG PO CAPS: 200.0000 mg | ORAL_CAPSULE | Freq: Three times a day (TID) | ORAL | 0 refills | Status: AC | PRN

## 2023-12-22 NOTE — ED Provider Notes (Signed)
 [facility]    CSN: [id number] Arrival date & time: 12/22/23  1756      History   Chief Complaint Chief Complaint  Patient presents with   Cough    Entered by patient    HPI Jeffrey Chang is a 28 y.o. male  presents for evaluation of URI symptoms for 4 days. Patient reports associated symptoms of cough, congestion, sore throat with chills for. Denies N/V/D,, ear pain, bodyaches, shortness of breath. Patient does not have a hx of asthma. Patient is an active smoker.   Reports no known sick contacts.  Pt has taken Tylenol  OTC for symptoms. Pt has no other concerns at this time.    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 No pertinent surgical history.     Home Medications    Prior to Admission medications   Medication Sig Start Date End Date Taking? Authorizing Provider  benzonatate  (TESSALON ) 200 MG capsule Take 1 capsule (200 mg total) by mouth 3 (three) times daily as needed. 12/22/23  Yes Dorsel Flinn, Jodi R, NP  promethazine -dextromethorphan (PROMETHAZINE -DM) 6.25-15 MG/5ML syrup Take 5 mLs by mouth at bedtime as needed for cough. 12/22/23  Yes Truong Delcastillo, Jodi R, NP  EPINEPHrine  (EPIPEN  2-PAK) 0.3 mg/0.3 mL IJ SOAJ injection Inject 0.3 mg into the muscle as needed for anaphylaxis. 07/16/23   Vivienne Delon HERO, PA-C  lidocaine  (LIDODERM ) 5 % Place 1 patch onto the skin daily. Remove & Discard patch within 12 hours or as directed by MD 07/28/23   Barrett, Jamie N, PA-C  meclizine (ANTIVERT) 25 MG tablet Take 1 tablet (25 mg total) by mouth 3 (three) times daily as needed for dizziness. 12/13/23   Lavell Lye A, FNP  ondansetron  (ZOFRAN ) 4 MG tablet  Take 1 tablet (4 mg total) by mouth every 8 (eight) hours as needed for nausea or vomiting. 12/13/23   Lavell Lye LABOR, FNP    Family History Family History  Problem Relation Age of Onset   Rheum arthritis Mother    Rheum arthritis Father    Diabetes Brother     Social History Social History   Tobacco Use   Smoking status: Every Day    Types: Cigars   Smokeless tobacco: Never  Vaping Use   Vaping status: Never Used  Substance Use Topics   Alcohol use: Yes    Comment: Occassionally.   Drug use: Not Currently     Allergies   Latex and Anusol -hc [hydrocortisone ]   Review of Systems Review of Systems  Constitutional:  Positive for chills.  HENT:  Positive for congestion and sore throat.   Respiratory:  Positive for cough.      Physical Exam Triage Vital Signs ED Triage Vitals  Encounter Vitals Group     BP 12/22/23 1805 125/86     Girls Systolic BP Percentile --      Girls Diastolic BP Percentile --      Boys Systolic BP Percentile --      Boys Diastolic BP Percentile --      Pulse Rate 12/22/23 1805 88     Resp 12/22/23 1805 16  Temp 12/22/23 1805 98.1 F (36.7 C)     Temp Source 12/22/23 1805 Oral     SpO2 12/22/23 1805 98 %     Weight --      Height --      Head Circumference --      Peak Flow --      Pain Score 12/22/23 1804 9     Pain Loc --      Pain Education --      Exclude from Growth Chart --    No data found.  Updated Vital Signs BP 125/86   Pulse 88   Temp 98.1 F (36.7 C) (Oral)   Resp 16   SpO2 98%   Visual Acuity Right Eye Distance:   Left Eye Distance:   Bilateral Distance:    Right Eye Near:   Left Eye Near:    Bilateral Near:     Physical Exam Vitals and nursing note reviewed.  Constitutional:      General: He is not in acute distress.    Appearance: Normal appearance. He is not ill-appearing or toxic-appearing.  HENT:     Head: Normocephalic and atraumatic.     Right Ear: Tympanic membrane and ear canal normal.      Left Ear: Tympanic membrane and ear canal normal.     Nose: Congestion present.     Mouth/Throat:     Mouth: Mucous membranes are moist.     Pharynx: Posterior oropharyngeal erythema present.  Eyes:     Pupils: Pupils are equal, round, and reactive to light.  Cardiovascular:     Rate and Rhythm: Normal rate and regular rhythm.     Heart sounds: Normal heart sounds.  Pulmonary:     Effort: Pulmonary effort is normal.     Breath sounds: Normal breath sounds. No wheezing or rhonchi.  Musculoskeletal:     Cervical back: Normal range of motion and neck supple.  Lymphadenopathy:     Cervical: No cervical adenopathy.  Skin:    General: Skin is warm and dry.  Neurological:     General: No focal deficit present.     Mental Status: He is alert and oriented to person, place, and time.  Psychiatric:        Mood and Affect: Mood normal.        Behavior: Behavior normal.      UC Treatments / Results  Labs (all labs ordered are listed, but only abnormal results are displayed) Labs Reviewed  POCT RAPID STREP A (OFFICE)    EKG   Radiology No results found.  Procedures Procedures (including critical care time)  Medications Ordered in UC Medications - No data to display  Initial Impression / Assessment and Plan / UC Course  I have reviewed the triage vital signs and the nursing notes.  Pertinent labs & imaging results that were available during my care of the patient were reviewed by me and considered in my medical decision making (see chart for details).     Negative rapid strep, patient declined COVID testing.  Discussed viral illness and symptomatic treatment.  Tessalon  as needed for cough during day and Promethazine  DM at night, side effect profile reviewed.  Encouraged rest fluids and PCP follow-up if symptoms do not improve.  ER precautions reviewed Final Clinical Impressions(s) / UC Diagnoses   Final diagnoses:  Sore throat  Viral upper respiratory illness      Discharge Instructions      You tested negative for strep  throat.  You may use Tessalon  cough pills during the day and you may take Promethazine  DM as needed for cough at night.  This medication will make you drowsy.  Do not drink alcohol or drive on this medication.  Lots of rest and fluids and please follow-up with your PCP if your symptoms do not improve.  Please go to the ER for any worsening symptoms.  Hope you feel better soon!     ED Prescriptions     Medication Sig Dispense Auth. Provider   benzonatate  (TESSALON ) 200 MG capsule Take 1 capsule (200 mg total) by mouth 3 (three) times daily as needed. 20 capsule Lessie Manigo, Jodi R, NP   promethazine -dextromethorphan (PROMETHAZINE -DM) 6.25-15 MG/5ML syrup Take 5 mLs by mouth at bedtime as needed for cough. 25 mL Lance Huaracha, Jodi R, NP      PDMP not reviewed this encounter.   Loreda Myla SAUNDERS, NP 12/22/23 1840

## 2023-12-22 NOTE — Discharge Instructions (Signed)
 You tested negative for strep throat.  You may use Tessalon  cough pills during the day and you may take Promethazine  DM as needed for cough at night.  This medication will make you drowsy.  Do not drink alcohol or drive on this medication.  Lots of rest and fluids and please follow-up with your PCP if your symptoms do not improve.  Please go to the ER for any worsening symptoms.  Hope you feel better soon!

## 2023-12-22 NOTE — ED Triage Notes (Signed)
 Pt c/o productive cough w/green mucous, sore throatx4d.

## 2023-12-28 ENCOUNTER — Ambulatory Visit
Admission: RE | Admit: 2023-12-28 | Discharge: 2023-12-28 | Disposition: A | Discharge status: Home / Self Care | Source: Ambulatory Visit

## 2023-12-28 VITALS — BP 129/84 | HR 95 | Temp 98.7°F | Resp 17

## 2023-12-28 DIAGNOSIS — Z760 Encounter for issue of repeat prescription: Secondary | ICD-10-CM | POA: Diagnosis not present

## 2023-12-28 DIAGNOSIS — S39012A Strain of muscle, fascia and tendon of lower back, initial encounter: Secondary | ICD-10-CM | POA: Diagnosis not present

## 2023-12-28 MED ORDER — DICLOFENAC SODIUM 75 MG PO TBEC: 75.0000 mg | DELAYED_RELEASE_TABLET | Freq: Two times a day (BID) | ORAL | 0 refills | Status: AC

## 2023-12-28 MED ORDER — BACLOFEN 10 MG PO TABS: 10.0000 mg | ORAL_TABLET | Freq: Three times a day (TID) | ORAL | 0 refills | Status: AC

## 2023-12-28 MED ORDER — ALBUTEROL SULFATE HFA 108 (90 BASE) MCG/ACT IN AERS: 1.0000 | INHALATION_SPRAY | Freq: Four times a day (QID) | RESPIRATORY_TRACT | 0 refills | Status: AC | PRN

## 2023-12-28 NOTE — ED Triage Notes (Addendum)
 Pt c/o bilateral lower back pain for about 2 weeks. 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

## 2023-12-28 NOTE — Discharge Instructions (Signed)
  1. Low back strain, initial encounter (Primary) - diclofenac  (VOLTAREN ) 75 MG EC tablet; Take 1 tablet (75 mg total) by mouth 2 (two) times daily.  Dispense: 30 tablet; Refill: 0 - baclofen (LIORESAL) 10 MG tablet; Take 1 tablet (10 mg total) by mouth 3 (three) times daily.  Dispense: 30 each; Refill: 0  2. Medication refill - albuterol  (VENTOLIN  HFA) 108 (90 Base) MCG/ACT inhaler; Inhale 1-2 puffs into the lungs every 6 (six) hours as needed for wheezing or shortness of breath.  Dispense: 18 g; Refill: 0  -Continue to monitor symptoms for any change in severity if there is any escalation of current symptoms or development of new symptoms follow-up in ER or return to UC for further evaluation and management.

## 2023-12-28 NOTE — ED Provider Notes (Signed)
 [facility]  Note:  This document was prepared using Dragon voice recognition software and may include unintentional dictation errors.  MRN: [id number] DOB: 08/04/1995  Subjective:   Jeffrey Chang is a 28 y.o. male presenting for evaluation of bilateral lower back pain x 2 weeks.  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  Patient reports that a couple weeks ago he was helping lift tires off of a trailer and believes he may have hurt his back at this time.  Patient states that since this has occurred he has been taking Tylenol  and ibuprofen  with minimal improvement to symptoms.  Patient is here for evaluation because he thinks that lifting at work seems to be making his symptoms worse.  Patient also reports that he lost his albuterol  inhaler and was hoping for refill to manage his mild asthma.  No current facility-administered medications for this encounter.  Current Outpatient Medications:    albuterol  (VENTOLIN  HFA) 108 (90 Base) MCG/ACT inhaler, Inhale 1-2 puffs into the lungs every 6 (six) hours as needed for wheezing or shortness of breath., Disp: 18 g, Rfl: 0   baclofen (LIORESAL) 10 MG tablet, Take 1 tablet (10 mg total) by mouth 3 (three) times daily., Disp: 30 each, Rfl: 0   diclofenac  (VOLTAREN ) 75 MG EC tablet, Take 1 tablet (75 mg total) by mouth 2 (two) times daily., Disp: 30 tablet, Rfl: 0   benzonatate  (TESSALON ) 200 MG capsule, Take 1 capsule (200 mg total) by mouth 3 (three) times daily as needed., Disp: 20 capsule, Rfl: 0   EPINEPHrine  (EPIPEN  2-PAK) 0.3 mg/0.3 mL IJ SOAJ injection, Inject 0.3 mg into the muscle as needed for anaphylaxis., Disp: 1 each, Rfl: 0   lidocaine  (LIDODERM ) 5 %, Place 1 patch onto the skin daily. Remove & Discard patch within 12 hours or as directed by MD, Disp: 30 patch, Rfl: 0   meclizine (ANTIVERT) 25 MG tablet, Take 1 tablet (25 mg total) by mouth 3 (three) times daily as needed for dizziness., Disp: 30  tablet, Rfl: 0   ondansetron  (ZOFRAN ) 4 MG tablet, Take 1 tablet (4 mg total) by mouth every 8 (eight) hours as needed for nausea or vomiting., Disp: 20 tablet, Rfl: 0   promethazine -dextromethorphan (PROMETHAZINE -DM) 6.25-15 MG/5ML syrup, Take 5 mLs by mouth at bedtime as needed for cough., Disp: 25 mL, Rfl: 0   Allergies  Allergen Reactions   Latex Itching    Other Reaction(s): Other (See Comments)  REDNESS   Anusol -Hc [Hydrocortisone ] Itching and Rash    Past Medical History:  Diagnosis Date   Bipolar 1 disorder (HCC)    Cannabis abuse with psychotic disorder, 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 No pertinent surgical history.  Family History  Problem Relation Age of Onset   Rheum arthritis Mother    Rheum arthritis Father    Diabetes Brother     Social History   Tobacco Use   Smoking status: Every Day    Types: Cigars   Smokeless tobacco: Never  Vaping Use   Vaping status: Never Used  Substance Use Topics   Alcohol use: Yes    Comment: Occassionally.   Drug use: Not Currently    ROS Refer to HPI for ROS details.  Objective:   Vitals: BP 129/84 (BP Location: Right Arm)   Pulse 95   Temp 98.7 F (37.1 C) (Oral)   Resp  17   SpO2 98%   Physical Exam Vitals and nursing note reviewed.  Constitutional:      General: He is not in acute distress.    Appearance: Normal appearance. He is well-developed. He is not ill-appearing or toxic-appearing.  HENT:     Head: Normocephalic.  Cardiovascular:     Rate and Rhythm: Normal rate.  Pulmonary:     Effort: Pulmonary effort is normal. No respiratory distress.  Musculoskeletal:     Lumbar back: Spasms and tenderness present. No swelling, signs of trauma or bony tenderness. Decreased range of motion. Positive right straight leg raise test and positive left straight leg raise test.  Skin:    General: Skin is warm and dry.  Neurological:      General: No focal deficit present.     Mental Status: He is alert and oriented to person, place, and time.  Psychiatric:        Mood and Affect: Mood normal.        Behavior: Behavior normal.     Procedures  No results found for this or any previous visit (from the past 24 hours).  No results found.   Assessment and Plan :     Discharge Instructions       1. Low back strain, initial encounter (Primary) - diclofenac  (VOLTAREN ) 75 MG EC tablet; Take 1 tablet (75 mg total) by mouth 2 (two) times daily.  Dispense: 30 tablet; Refill: 0 - baclofen (LIORESAL) 10 MG tablet; Take 1 tablet (10 mg total) by mouth 3 (three) times daily.  Dispense: 30 each; Refill: 0  2. Medication refill - albuterol  (VENTOLIN  HFA) 108 (90 Base) MCG/ACT inhaler; Inhale 1-2 puffs into the lungs every 6 (six) hours as needed for wheezing or shortness of breath.  Dispense: 18 g; Refill: 0  -Continue to monitor symptoms for any change in severity if there is any escalation of current symptoms or development of new symptoms follow-up in ER or return to UC for further evaluation and management.      Koron Godeaux B Staphany Ditton   Kiaira Pointer, [place], [place] 12/28/23 1921

## 2023-12-30 ENCOUNTER — Telehealth: Admitting: Family Medicine

## 2023-12-30 DIAGNOSIS — K649 Unspecified hemorrhoids: Secondary | ICD-10-CM | POA: Diagnosis not present

## 2023-12-30 MED ORDER — HYDROCORTISONE ACETATE 25 MG RE SUPP: 25.0000 mg | Freq: Two times a day (BID) | RECTAL | 0 refills | Status: AC

## 2023-12-30 NOTE — Progress Notes (Signed)

## 2024-01-15 ENCOUNTER — Telehealth

## 2024-01-15 NOTE — Progress Notes (Signed)
 Pt did not show for visit [facility]

## 2024-01-16 ENCOUNTER — Telehealth: Admitting: Family Medicine

## 2024-01-16 DIAGNOSIS — R21 Rash and other nonspecific skin eruption: Secondary | ICD-10-CM | POA: Diagnosis not present

## 2024-01-16 MED ORDER — PREDNISONE 10 MG (21) PO TBPK: ORAL_TABLET | ORAL | 0 refills | Status: AC

## 2024-01-16 NOTE — Progress Notes (Signed)
 Virtual Visit Consent   Jeffrey Chang, you are scheduled for a virtual visit with a [facility] provider today. Just as with appointments in the office, your consent must be obtained to participate. Your consent will be active for this visit and any virtual visit you may have with one of our providers in the next 365 days. If you have a MyChart account, a copy of this consent can be sent to you electronically.  As this is a virtual visit, video technology does not allow for your provider to perform a traditional examination. This may limit your provider's ability to fully assess your condition. If your provider identifies any concerns that need to be evaluated in person or the need to arrange testing (such as labs, EKG, etc.), we will make arrangements to do so. Although advances in technology are sophisticated, we cannot ensure that it will always work on either your end or our end. If the connection with a video visit is poor, the visit may have to be switched to a telephone visit. With either a video or telephone visit, we are not always able to ensure that we have a secure connection.  By engaging in this virtual visit, you consent to the provision of healthcare and authorize for your insurance to be billed (if applicable) for the services provided during this visit. Depending on your insurance coverage, you may receive a charge related to this service.  I need to obtain your verbal consent now. Are you willing to proceed with your visit today? Jeffrey Chang has provided verbal consent on 01/16/2024 for a virtual visit (video or telephone). Jeffrey Chang, [place]  Date: 01/16/2024 3:24 PM   Virtual Visit via Video Note   I, Jeffrey Chang, connected with  [name]  ([id number], Jul 22, 1995) on 01/16/24 at  3:15 PM EST by a video-enabled telemedicine application and verified that I am speaking with the correct person using two identifiers.  Location: Patient: Virtual Visit Location Patient:  Home Provider: Virtual Visit Location Provider: Home Office   I discussed the limitations of evaluation and management by telemedicine and the availability of in person appointments. The patient expressed understanding and agreed to proceed.    History of Present Illness: Jeffrey Chang is a 28 y.o. who identifies as a male who was assigned male at birth, and is being seen today for c/o rash in rectal area after using suppositories for hemorrhoids. Pt states rash has been about two weeks, extending to his legs, feet and hands.  Pt states rash is itchy. Pt states it is weird to explain. Pt states taking oral steroids has helped in the past.  Pt states he has taken prednisone  and it was helpful.   HPI: HPI  Problems:  Patient Active Problem List   Diagnosis Date Noted   Bipolar 1 disorder (HCC)     Allergies:  Allergies  Allergen Reactions   Latex Itching    Other Reaction(s): Other (See Comments)  REDNESS   Anusol -Hc [Hydrocortisone ] Itching and Rash   Medications:  Current Outpatient Medications:    predniSONE  (STERAPRED UNI-PAK 21 TAB) 10 MG (21) TBPK tablet, Take following package directions., Disp: 21 tablet, Rfl: 0   albuterol  (VENTOLIN  HFA) 108 (90 Base) MCG/ACT inhaler, Inhale 1-2 puffs into the lungs every 6 (six) hours as needed for wheezing or shortness of breath., Disp: 18 g, Rfl: 0   baclofen  (LIORESAL ) 10 MG tablet, Take 1 tablet (10 mg total) by mouth 3 (three) times daily., Disp: 30 each, Rfl:  0   benzonatate  (TESSALON ) 200 MG capsule, Take 1 capsule (200 mg total) by mouth 3 (three) times daily as needed., Disp: 20 capsule, Rfl: 0   diclofenac  (VOLTAREN ) 75 MG EC tablet, Take 1 tablet (75 mg total) by mouth 2 (two) times daily., Disp: 30 tablet, Rfl: 0   EPINEPHrine  (EPIPEN  2-PAK) 0.3 mg/0.3 mL IJ SOAJ injection, Inject 0.3 mg into the muscle as needed for anaphylaxis., Disp: 1 each, Rfl: 0   hydrocortisone  (ANUSOL -HC) 25 MG suppository, Place 1 suppository (25 mg  total) rectally 2 (two) times daily., Disp: 12 suppository, Rfl: 0   lidocaine  (LIDODERM ) 5 %, Place 1 patch onto the skin daily. Remove & Discard patch within 12 hours or as directed by MD, Disp: 30 patch, Rfl: 0   meclizine  (ANTIVERT ) 25 MG tablet, Take 1 tablet (25 mg total) by mouth 3 (three) times daily as needed for dizziness., Disp: 30 tablet, Rfl: 0   ondansetron  (ZOFRAN ) 4 MG tablet, Take 1 tablet (4 mg total) by mouth every 8 (eight) hours as needed for nausea or vomiting., Disp: 20 tablet, Rfl: 0   promethazine -dextromethorphan (PROMETHAZINE -DM) 6.25-15 MG/5ML syrup, Take 5 mLs by mouth at bedtime as needed for cough., Disp: 25 mL, Rfl: 0  Observations/Objective: Patient is well-developed, well-nourished in no acute distress.  Resting comfortably at home.  Head is normocephalic, atraumatic.  No labored breathing.  Speech is clear and coherent with logical content.  Patient is alert and oriented at baseline.    Assessment and Plan: 1. Rash and nonspecific skin eruption (Primary) - predniSONE  (STERAPRED UNI-PAK 21 TAB) 10 MG (21) TBPK tablet; Take following package directions.  Dispense: 21 tablet; Refill: 0  -Pt states he is ok with taking Prednisone  -Pt advised to follow up with in person urgent care or PCP if symptoms persist   Follow Up Instructions: I discussed the assessment and treatment plan with the patient. The patient was provided an opportunity to ask questions and all were answered. The patient agreed with the plan and demonstrated an understanding of the instructions.  A copy of instructions were sent to the patient via MyChart unless otherwise noted below.    The patient was advised to call back or seek an in-person evaluation if the symptoms worsen or if the condition Chang to improve as anticipated.    Jeffrey Mater, PA-C

## 2024-01-16 NOTE — Patient Instructions (Signed)
 Jeffrey Chang, thank you for joining [name], PA-C for today's virtual visit.  While this provider is not your primary care provider (PCP), if your PCP is located in our provider database this encounter information will be shared with them immediately following your visit.   A [place] [place] MyChart account gives you access to today's visit and all your visits, tests, and labs performed at [facility]  click here if you don't have a [facility] MyChart account or go to [email or web address]  Consent: (Patient) Jeffrey Chang provided verbal consent for this virtual visit at the beginning of the encounter.  Current Medications:  Current Outpatient Medications:    predniSONE  (STERAPRED UNI-PAK 21 TAB) 10 MG (21) TBPK tablet, Take following package directions., Disp: 21 tablet, Rfl: 0   albuterol  (VENTOLIN  HFA) 108 (90 Base) MCG/ACT inhaler, Inhale 1-2 puffs into the lungs every 6 (six) hours as needed for wheezing or shortness of breath., Disp: 18 g, Rfl: 0   baclofen  (LIORESAL ) 10 MG tablet, Take 1 tablet (10 mg total) by mouth 3 (three) times daily., Disp: 30 each, Rfl: 0   benzonatate  (TESSALON ) 200 MG capsule, Take 1 capsule (200 mg total) by mouth 3 (three) times daily as needed., Disp: 20 capsule, Rfl: 0   diclofenac  (VOLTAREN ) 75 MG EC tablet, Take 1 tablet (75 mg total) by mouth 2 (two) times daily., Disp: 30 tablet, Rfl: 0   EPINEPHrine  (EPIPEN  2-PAK) 0.3 mg/0.3 mL IJ SOAJ injection, Inject 0.3 mg into the muscle as needed for anaphylaxis., Disp: 1 each, Rfl: 0   hydrocortisone  (ANUSOL -HC) 25 MG suppository, Place 1 suppository (25 mg total) rectally 2 (two) times daily., Disp: 12 suppository, Rfl: 0   lidocaine  (LIDODERM ) 5 %, Place 1 patch onto the skin daily. Remove & Discard patch within 12 hours or as directed by MD, Disp: 30 patch, Rfl: 0   meclizine  (ANTIVERT ) 25 MG tablet, Take 1 tablet (25 mg total) by mouth 3 (three) times daily as needed for dizziness.,  Disp: 30 tablet, Rfl: 0   ondansetron  (ZOFRAN ) 4 MG tablet, Take 1 tablet (4 mg total) by mouth every 8 (eight) hours as needed for nausea or vomiting., Disp: 20 tablet, Rfl: 0   promethazine -dextromethorphan (PROMETHAZINE -DM) 6.25-15 MG/5ML syrup, Take 5 mLs by mouth at bedtime as needed for cough., Disp: 25 mL, Rfl: 0   Medications ordered in this encounter:  Meds ordered this encounter  Medications   predniSONE  (STERAPRED UNI-PAK 21 TAB) 10 MG (21) TBPK tablet    Sig: Take following package directions.    Dispense:  21 tablet    Refill:  0    Please dispense one standard blister pack taper. Pt states taking prednisone  in the past,     *If you need refills on other medications prior to your next appointment, please contact your pharmacy*  Follow-Up: Call back or seek an in-person evaluation if the symptoms worsen or if the condition fails to improve as anticipated.  [facility] [phone]  Other Instructions Rash, Adult A rash is a breakout of spots or blotches on the skin. It can affect the way the skin looks and feels. Many things can cause a rash. Common causes include: Viral infections. These include colds, measles, and hand, foot, and mouth disease. Bacterial infections. These include scarlet fever and impetigo. Fungal infections. These include athlete's foot, ringworm, and yeast rashes. Skin irritation. This may be from heat rash, exposure to moisture or friction for a long time (intertrigo), or exposure  to soap or skin care products (eczema). Allergic reactions. These may be caused by foods, medicines, or things like poison ivy. Some rashes may go away after a few days. Others may last for a few weeks. The goal of treatment is to stop the itching and keep the rash from spreading. Follow these instructions at home: Medicine Take or apply over-the-counter and prescription medicines only as told by your health care provider. These may include: Corticosteroids.  These can help treat red or swollen skin. They may be given as creams or as medicines to take by mouth (orally). Anti-itch lotions. Allergy medicines. Pain medicine. Antifungal medicine if the rash is from a fungal infection. Antibiotics if you have an infection.  Skin care Apply cool, wet cloths (compresses) to the affected areas. Do not scratch or rub your skin. Avoid covering the rash. Keep it exposed to air as often as you can. Managing itching and discomfort Avoid hot showers and baths. These can make itching worse. A cold shower may help. Try taking a bath with: Epsom salts. You can get these at your local pharmacy or grocery store. Follow the instructions on the package. Baking soda. Pour a small amount into the bath as told by your provider. Colloidal oatmeal. You can get this at your local pharmacy or grocery store. Follow the instructions on the package. Try putting baking soda paste on your skin. Stir water into baking soda until it becomes like a paste. Try using calamine lotion or cortisone cream to help with itchiness. Keep cool. Stay out of the sun. Sweating and being hot can make itching worse. General instructions  Rest as needed. Drink enough fluid to keep your pee (urine) pale yellow. Wear loose-fitting clothes. Avoid scented soaps, detergents, and perfumes. Use gentle soaps, detergents, perfumes, and cosmetics. Avoid the things that cause your rash (triggers). Keep a journal to help keep track of your triggers. Write down: What you eat. What cosmetics you use. What you drink. What you wear. This includes jewelry. Contact a health care provider if: You sweat at night more than normal. You pee (urinate) more or less than normal, or your pee is a darker color than normal. Your eyes become sensitive to light. Your skin or the white parts of your eyes turn yellow (jaundice). Your skin tingles or is numb. You get painful blisters in your nose or mouth. Your rash  does not go away after a few days, or it gets worse. You are more tired or thirsty than normal. You have new or worse symptoms. These may include: Pain in your abdomen. Fever. Diarrhea or vomiting. Weakness or weight loss. Get help right away if: You get confused. You have a severe headache, a stiff neck, or severe joint pain or stiffness. You become very sleepy or not responsive. You have a seizure. This information is not intended to replace advice given to you by your health care provider. Make sure you discuss any questions you have with your health care provider. Document Revised: 11/15/2021 Document Reviewed: 11/15/2021 Elsevier Patient Education  2024 Elsevier Inc.   If you have been instructed to have an in-person evaluation today at a local Urgent Care facility, please use the link below. It will take you to a list of all of our available [facility], including address, phone number and hours of operation. Please do not delay care.  [place] [facility]  If you or a family member do not have a primary care provider, use the  link below to schedule a visit and establish care. When you choose a [facility] primary care physician or advanced practice provider, you gain a long-term partner in health. Find a Primary Care Provider  Learn more about [facility]'s in-office and virtual care options: [place] - Get Care Now

## 2024-01-20 NOTE — Progress Notes (Deleted)
° °   [facility] [address] [place] [address] Phone: [phone]   Assessment and Plan:     ***    Pertinent previous records reviewed include ***   Follow Up: ***     Subjective:   I, Kreig Parson, am serving as a neurosurgeon for Doctor Morene Mace  Chief Complaint: back pain   HPI:   01/21/2024 Patient is a 28 year old male with back pain. Patient states   Relevant Historical Information: ***  Additional pertinent review of systems negative.   Current Outpatient Medications:    albuterol  (VENTOLIN  HFA) 108 (90 Base) MCG/ACT inhaler, Inhale 1-2 puffs into the lungs every 6 (six) hours as needed for wheezing or shortness of breath., Disp: 18 g, Rfl: 0   baclofen  (LIORESAL ) 10 MG tablet, Take 1 tablet (10 mg total) by mouth 3 (three) times daily., Disp: 30 each, Rfl: 0   benzonatate  (TESSALON ) 200 MG capsule, Take 1 capsule (200 mg total) by mouth 3 (three) times daily as needed., Disp: 20 capsule, Rfl: 0   diclofenac  (VOLTAREN ) 75 MG EC tablet, Take 1 tablet (75 mg total) by mouth 2 (two) times daily., Disp: 30 tablet, Rfl: 0   EPINEPHrine  (EPIPEN  2-PAK) 0.3 mg/0.3 mL IJ SOAJ injection, Inject 0.3 mg into the muscle as needed for anaphylaxis., Disp: 1 each, Rfl: 0   hydrocortisone  (ANUSOL -HC) 25 MG suppository, Place 1 suppository (25 mg total) rectally 2 (two) times daily., Disp: 12 suppository, Rfl: 0   lidocaine  (LIDODERM ) 5 %, Place 1 patch onto the skin daily. Remove & Discard patch within 12 hours or as directed by MD, Disp: 30 patch, Rfl: 0   meclizine  (ANTIVERT ) 25 MG tablet, Take 1 tablet (25 mg total) by mouth 3 (three) times daily as needed for dizziness., Disp: 30 tablet, Rfl: 0   ondansetron  (ZOFRAN ) 4 MG tablet, Take 1 tablet (4 mg total) by mouth every 8 (eight) hours as needed for nausea or vomiting., Disp: 20 tablet, Rfl: 0   predniSONE  (STERAPRED UNI-PAK 21 TAB) 10 MG (21) TBPK tablet, Take  following package directions., Disp: 21 tablet, Rfl: 0   promethazine -dextromethorphan (PROMETHAZINE -DM) 6.25-15 MG/5ML syrup, Take 5 mLs by mouth at bedtime as needed for cough., Disp: 25 mL, Rfl: 0   Objective:     There were no vitals filed for this visit.    There is no height or weight on file to calculate BMI.    Physical Exam:    ***   Electronically signed by:  Odis Mace D.CLEMENTEEN [facility] 4:16 PM 01/20/24

## 2024-01-21 ENCOUNTER — Ambulatory Visit: Admitting: Sports Medicine

## 2024-01-22 ENCOUNTER — Telehealth: Admitting: Physician Assistant

## 2024-01-22 DIAGNOSIS — B353 Tinea pedis: Secondary | ICD-10-CM

## 2024-01-22 NOTE — Progress Notes (Signed)
°  Because this has been a recurrent issue and not resolving with normal management, I feel your condition warrants further evaluation and I recommend that you be seen in a face-to-face visit.   NOTE: There will be NO CHARGE for this E-Visit   If you are having a true medical emergency, please call 911.     For an urgent face to face visit, [facility] has multiple urgent care centers for your convenience.  Click the link below for the full list of locations and hours, walk-in wait times, appointment scheduling options and driving directions:  Urgent Care - [place], [place], [place], [place], [place], [place]       Your MyChart E-visit questionnaire answers were reviewed by a board certified advanced clinical practitioner to complete your personal care plan based on your specific symptoms.    Thank you for using e-Visits.

## 2024-01-25 ENCOUNTER — Ambulatory Visit: Payer: Self-pay | Admitting: Podiatry

## 2024-01-28 ENCOUNTER — Telehealth: Admitting: Physician Assistant

## 2024-01-28 DIAGNOSIS — B370 Candidal stomatitis: Secondary | ICD-10-CM | POA: Diagnosis not present

## 2024-01-29 MED ORDER — NYSTATIN 100000 UNIT/ML MT SUSP: 5.0000 mL | Freq: Four times a day (QID) | OROMUCOSAL | 0 refills | Status: AC

## 2024-01-29 NOTE — Progress Notes (Signed)
 E-Visit Treatment for Thrush symptoms  We are sorry that you are not feeling well. Here is how we plan to help!  Based on what you have shared with me, it appears that you have Thrush.  Celestino is a fungal (yeast) infection that can grow in your mouth, throat, and other parts of your body. With oral thrush (oral candidiasis), you may develop white, raised, cottage cheese-like lesions (spots) on your tongue and cheeks. Celestino can quickly become irritated and cause mouth pain and redness. Thrush usually develops suddenly. A common sign is the presence of creamy white, slightly raised lesions in your mouth -- usually on your tongue or inner cheeks. You may also have lesions on the roof of your mouth, gums, tonsils, or back of your throat.   Other symptoms may include: Redness and soreness inside and at the corners of your mouth Loss of sense of taste ([place]) Cottony feeling in your mouth  The lesions can hurt and may bleed a little when you scrape them or brush your teeth. In severe cases, the lesions can spread into your esophagus and cause: Pain or difficulty swallowing A feeling that food gets stuck in your throat or mid-chest area Fever, if the infection spreads beyond your esophagus  Most people have small amounts of the Candida fungus in their mouth, digestive tract and skin. When illnesses, stress or medications disturb this balance, the fungus grows out of control and causes thrush.  Medications that can make yeast flourish and cause infection include: Corticosteroids Inhalers Antibiotics Birth Control Pills  Celestino can be contagious to those at risk (like people with weakened immune systems or who take certain medications). In people with healthy immune systems, it's unusual to pass thrush through kissing or other close contact. In most cases, thrush isn't particularly contagious (meaning, it doesn't spread from person to person), but it is transmittable (meaning, you can catch it  in other ways, like through saliva when you are immunocompromised).  I have prescribed I have prescribed an antifungal - Nystatin  suspension 100,000 units/mL Swish and swallow 5mL every 6 hours for up to 10-14 days  Prevention: Practice good oral hygiene. See your dentist regularly. This is especially important if you have diabetes or wear dentures. Limit the amount of sugar and yeast-containing foods you eat. Foods such as bread, beer and wine encourage Candida growth. Avoid smoking and other tobacco use. Ask your healthcare provider about ways to help you quit smoking (We do offer a smoking cessation program through the [facility] that you can schedule on your time through MyChart). With treatment, thrush usually goes away within one to two weeks. But if your symptoms linger or get worse, please seek in-person evaluation.  Home Care: Swish with warm saltwater. Take probiotics. Eat yogurt that contains healthy bacteria.  GET HELP RIGHT AWAY IF: Ulcers that are spreading, are very large or particularly painful Ulcers last longer than one week without improving on treatment If you develop a fever, swollen glands and begin to feel unwell  MAKE SURE YOU: Understand these instructions Will watch your condition. Will get help right away if you are not doing well or get worse.  Thank you for choosing an e-visit!  Your e-visit answers were reviewed by a board certified advanced clinical practitioner to complete your personal care plan. Depending upon the condition, your plan could have included both over the counter or prescription medications.  Please review your pharmacy choice. Make sure the pharmacy is open so  you can pick up prescription now. If there is a problem, you may contact your provider through [employer] and have the prescription routed to another pharmacy. Your safety is important to us . If you have drug allergies, check your prescription  carefully.  For the next 24 hours you can use MyChart to ask questions about today's visit, request a non-urgent call back, or ask for a work or school excuse.  You will get an email in the next two days asking about your experience. I hope that your e-visit has been valuable and will speed your recovery.     I have spent 5 minutes in review of e-visit questionnaire, review and updating patient chart, medical decision making and response to patient.   Delon CHRISTELLA Dickinson, PA-C

## 2024-02-01 ENCOUNTER — Telehealth

## 2024-02-01 DIAGNOSIS — H6993 Unspecified Eustachian tube disorder, bilateral: Secondary | ICD-10-CM

## 2024-02-02 MED ORDER — FLUTICASONE PROPIONATE 50 MCG/ACT NA SUSP: 2.0000 | Freq: Every day | NASAL | 0 refills | Status: AC

## 2024-02-02 NOTE — Progress Notes (Signed)

## 2024-02-03 ENCOUNTER — Telehealth: Admitting: Physician Assistant

## 2024-02-03 ENCOUNTER — Telehealth

## 2024-02-03 DIAGNOSIS — R440 Auditory hallucinations: Secondary | ICD-10-CM

## 2024-02-03 NOTE — Patient Instructions (Signed)
 " Jeffrey Chang, thank you for joining Jeffrey Velma Lunger, PA-C for today's virtual visit.  While this provider is not your primary care provider (PCP), if your PCP is located in our provider database this encounter information will be shared with them immediately following your visit.   A [facility] MyChart account gives you access to today's visit and all your visits, tests, and labs performed at [facility]  click here if you don't have a [facility] MyChart account or go to [email or web address]  Consent: (Patient) Jeffrey Chang provided verbal consent for this virtual visit at the beginning of the encounter.  Current Medications:  Current Outpatient Medications:    albuterol  (VENTOLIN  HFA) 108 (90 Base) MCG/ACT inhaler, Inhale 1-2 puffs into the lungs every 6 (six) hours as needed for wheezing or shortness of breath., Disp: 18 g, Rfl: 0   baclofen  (LIORESAL ) 10 MG tablet, Take 1 tablet (10 mg total) by mouth 3 (three) times daily., Disp: 30 each, Rfl: 0   benzonatate  (TESSALON ) 200 MG capsule, Take 1 capsule (200 mg total) by mouth 3 (three) times daily as needed., Disp: 20 capsule, Rfl: 0   diclofenac  (VOLTAREN ) 75 MG EC tablet, Take 1 tablet (75 mg total) by mouth 2 (two) times daily., Disp: 30 tablet, Rfl: 0   EPINEPHrine  (EPIPEN  2-PAK) 0.3 mg/0.3 mL IJ SOAJ injection, Inject 0.3 mg into the muscle as needed for anaphylaxis., Disp: 1 each, Rfl: 0   fluticasone  (FLONASE ) 50 MCG/ACT nasal spray, Place 2 sprays into both nostrils daily., Disp: 16 g, Rfl: 0   hydrocortisone  (ANUSOL -HC) 25 MG suppository, Place 1 suppository (25 mg total) rectally 2 (two) times daily., Disp: 12 suppository, Rfl: 0   lidocaine  (LIDODERM ) 5 %, Place 1 patch onto the skin daily. Remove & Discard patch within 12 hours or as directed by MD, Disp: 30 patch, Rfl: 0   meclizine  (ANTIVERT ) 25 MG tablet, Take 1 tablet (25 mg total) by mouth 3 (three) times daily as needed for dizziness., Disp: 30  tablet, Rfl: 0   nystatin  (MYCOSTATIN ) 100000 UNIT/ML suspension, Take 5 mLs (500,000 Units total) by mouth 4 (four) times daily., Disp: 240 mL, Rfl: 0   ondansetron  (ZOFRAN ) 4 MG tablet, Take 1 tablet (4 mg total) by mouth every 8 (eight) hours as needed for nausea or vomiting., Disp: 20 tablet, Rfl: 0   predniSONE  (STERAPRED UNI-PAK 21 TAB) 10 MG (21) TBPK tablet, Take following package directions., Disp: 21 tablet, Rfl: 0   promethazine -dextromethorphan (PROMETHAZINE -DM) 6.25-15 MG/5ML syrup, Take 5 mLs by mouth at bedtime as needed for cough., Disp: 25 mL, Rfl: 0   Medications ordered in this encounter:  No orders of the defined types were placed in this encounter.    *If you need refills on other medications prior to your next appointment, please contact your pharmacy*  Follow-Up: Call back or seek an in-person evaluation if the symptoms worsen or if the condition fails to improve as anticipated.  [facility] [phone]  Other Instructions [facility]. [address], [place] [address] [phone]  If having any issue getting in tonight, please be evaluated at [facility] ER as they will have a psychiatric team come and help to assess/treat you.   PLEASE DO NOT DELAY CARE.   If you have been instructed to have an in-person evaluation today at a local Urgent Care facility, please use the link below. It will take you to a list of all of our available [facility], including address, phone number and hours of operation. Please do not delay care.  [facility]  If you or a family member do not have a primary care provider, use the link below to schedule a visit and establish care. When you choose a [facility] primary care physician or advanced practice provider, you gain a long-term partner in health. Find a Primary Care Provider  Learn more about [facility]'s in-office and virtual care options: [facility] - Get Care Now  "

## 2024-02-03 NOTE — Progress Notes (Signed)
 " Virtual Visit Consent   Jeffrey Chang, you are scheduled for a virtual visit with a [facility] provider today. Just as with appointments in the office, your consent must be obtained to participate. Your consent will be active for this visit and any virtual visit you may have with one of our providers in the next 365 days. If you have a MyChart account, a copy of this consent can be sent to you electronically.  As this is a virtual visit, video technology does not allow for your provider to perform a traditional examination. This may limit your provider's ability to fully assess your condition. If your provider identifies any concerns that need to be evaluated in person or the need to arrange testing (such as labs, EKG, etc.), we will make arrangements to do so. Although advances in technology are sophisticated, we cannot ensure that it will always work on either your end or our end. If the connection with a video visit is poor, the visit may have to be switched to a telephone visit. With either a video or telephone visit, we are not always able to ensure that we have a secure connection.  By engaging in this virtual visit, you consent to the provision of healthcare and authorize for your insurance to be billed (if applicable) for the services provided during this visit. Depending on your insurance coverage, you may receive a charge related to this service.  I need to obtain your verbal consent now. Are you willing to proceed with your visit today? Bryson Tinner has provided verbal consent on 02/03/2024 for a virtual visit (video or telephone). Jeffrey Chang, [place]  Date: 02/03/2024 6:04 PM   Virtual Visit via Video Note   I, Jeffrey Chang, connected with  Phinehas Grounds  ([id number], [birth year]) on 02/03/2024 at  6:00 PM EST by a video-enabled telemedicine application and verified that I am speaking with the correct person using two identifiers.  Location: Patient: Virtual Visit  Location Patient: Home Provider: Virtual Visit Location Provider: Home Office   I discussed the limitations of evaluation and management by telemedicine and the availability of in person appointments. The patient expressed understanding and agreed to proceed.    History of Present Illness: Jeffrey Chang is a 28 y.o. who identifies as a male who was assigned male at birth, with history of Bipolar Depression, and is being seen today for auditory hallucinations over the past few weeks. Notes some days experiencing them, and other days nothing. Notes some increased overall anxiety. Denies SI/HI. Denies threatening or commanding voices. Some increased paranoia as well. Of note, was recently on oral steroids a few weeks ago.    HPI: HPI  Problems:  Patient Active Problem List   Diagnosis Date Noted   Bipolar 1 disorder (HCC)     Allergies: Allergies[1] Medications: Current Medications[2]  Observations/Objective: Patient is well-developed, well-nourished in no acute distress.  Resting comfortably  at home.  Head is normocephalic, atraumatic.  No labored breathing.  Speech is clear and coherent with logical content.  Patient is alert and oriented at baseline.   Assessment and Plan: 1. Auditory hallucinations (Primary)  Non-threatening.  No SI/HI. Is talking coherently and logically at time of visit. Will send to [facility] for evaluation. If not able to get in, will seek ER evaluation.  Follow Up Instructions: I discussed the assessment and treatment plan with the patient. The patient was provided an opportunity to ask questions and all were answered. The patient agreed with  the plan and demonstrated an understanding of the instructions.  A copy of instructions were sent to the patient via MyChart unless otherwise noted below.   The patient was advised to call back or seek an in-person evaluation if the symptoms worsen or if the condition fails to improve as anticipated.    Jeffrey Velma Lunger, PA-C    [1]  Allergies Allergen Reactions   Latex Itching    Other Reaction(s): Other (See Comments)  REDNESS   Anusol -Hc [Hydrocortisone ] Itching and Rash  [2]  Current Outpatient Medications:    albuterol  (VENTOLIN  HFA) 108 (90 Base) MCG/ACT inhaler, Inhale 1-2 puffs into the lungs every 6 (six) hours as needed for wheezing or shortness of breath., Disp: 18 g, Rfl: 0   baclofen  (LIORESAL ) 10 MG tablet, Take 1 tablet (10 mg total) by mouth 3 (three) times daily., Disp: 30 each, Rfl: 0   benzonatate  (TESSALON ) 200 MG capsule, Take 1 capsule (200 mg total) by mouth 3 (three) times daily as needed., Disp: 20 capsule, Rfl: 0   diclofenac  (VOLTAREN ) 75 MG EC tablet, Take 1 tablet (75 mg total) by mouth 2 (two) times daily., Disp: 30 tablet, Rfl: 0   EPINEPHrine  (EPIPEN  2-PAK) 0.3 mg/0.3 mL IJ SOAJ injection, Inject 0.3 mg into the muscle as needed for anaphylaxis., Disp: 1 each, Rfl: 0   fluticasone  (FLONASE ) 50 MCG/ACT nasal spray, Place 2 sprays into both nostrils daily., Disp: 16 g, Rfl: 0   hydrocortisone  (ANUSOL -HC) 25 MG suppository, Place 1 suppository (25 mg total) rectally 2 (two) times daily., Disp: 12 suppository, Rfl: 0   lidocaine  (LIDODERM ) 5 %, Place 1 patch onto the skin daily. Remove & Discard patch within 12 hours or as directed by MD, Disp: 30 patch, Rfl: 0   meclizine  (ANTIVERT ) 25 MG tablet, Take 1 tablet (25 mg total) by mouth 3 (three) times daily as needed for dizziness., Disp: 30 tablet, Rfl: 0   nystatin  (MYCOSTATIN ) 100000 UNIT/ML suspension, Take 5 mLs (500,000 Units total) by mouth 4 (four) times daily., Disp: 240 mL, Rfl: 0   ondansetron  (ZOFRAN ) 4 MG tablet, Take 1 tablet (4 mg total) by mouth every 8 (eight) hours as needed for nausea or vomiting., Disp: 20 tablet, Rfl: 0   predniSONE  (STERAPRED UNI-PAK 21 TAB) 10 MG (21) TBPK tablet, Take following package directions., Disp: 21 tablet, Rfl: 0   promethazine -dextromethorphan (PROMETHAZINE -DM) 6.25-15  MG/5ML syrup, Take 5 mLs by mouth at bedtime as needed for cough., Disp: 25 mL, Rfl: 0  "

## 2024-02-05 ENCOUNTER — Telehealth: Admitting: Family Medicine

## 2024-02-05 DIAGNOSIS — R21 Rash and other nonspecific skin eruption: Secondary | ICD-10-CM

## 2024-02-05 NOTE — Progress Notes (Signed)
" °  Because Jeffrey Chang, I feel your condition warrants further evaluation and I recommend that you be seen in a face-to-face visit.   NOTE: There will be NO CHARGE for this E-Visit   If you are having a true medical emergency, please call 911.     For an urgent face to face visit, [name] has multiple urgent care centers for your convenience.  Click the link below for the full list of locations and hours, walk-in wait times, appointment scheduling options and driving directions:  Urgent Care - [place], [place], [place], [place], [place], [place]  [place]     Your MyChart E-visit questionnaire answers were reviewed by a board certified advanced clinical practitioner to complete your personal care plan based on your specific symptoms.    Thank you for using e-Visits.    "

## 2024-02-08 ENCOUNTER — Ambulatory Visit
Admission: EM | Admit: 2024-02-08 | Discharge: 2024-02-08 | Disposition: A | Discharge status: Home / Self Care | Attending: Physician Assistant | Admitting: Physician Assistant

## 2024-02-08 ENCOUNTER — Telehealth: Admitting: Physician Assistant

## 2024-02-08 DIAGNOSIS — B356 Tinea cruris: Secondary | ICD-10-CM

## 2024-02-08 DIAGNOSIS — B49 Unspecified mycosis: Secondary | ICD-10-CM

## 2024-02-08 MED ORDER — FLUCONAZOLE 150 MG PO TABS: 150.0000 mg | ORAL_TABLET | Freq: Every day | ORAL | 0 refills | Status: AC

## 2024-02-08 NOTE — ED Provider Notes (Signed)
 Jeffrey Chang    CSN: [id number] Arrival date & time: 02/08/24  1856      History   Chief Complaint Chief Complaint  Patient presents with   Rash    HPI Jeffrey Chang is a 28 y.o. male.   Patient complains of jock itch and a fungal infection to his upper arms.  Patient states that he has problems with moisture and areas and he has not been able to clear up the rash with topical antifungals.  Patient complains of itching.  He denies any fever or chills he has not had any discharge.  The history is provided by the patient. No language interpreter was used.  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 No pertinent surgical history.     Home Medications    Prior to Admission medications  Medication Sig Start Date End Date Taking? Authorizing Provider  fluconazole  (DIFLUCAN ) 150 MG tablet Take 1 tablet (150 mg total) by mouth daily. 02/08/24  Yes Flint Sonny POUR, PA-C  albuterol  (VENTOLIN  HFA) 108 (90 Base) MCG/ACT inhaler Inhale 1-2 puffs into the lungs every 6 (six) hours as needed for wheezing or shortness of breath. 12/28/23   Reddick, Johnathan B, NP  baclofen  (LIORESAL ) 10 MG tablet Take 1 tablet (10 mg total) by mouth 3 (three) times daily. 12/28/23   Reddick, Johnathan B, NP  benzonatate  (TESSALON ) 200 MG capsule Take 1 capsule (200 mg total) by mouth 3 (three) times daily as needed. 12/22/23   Mayer, Jodi R, NP  diclofenac  (VOLTAREN ) 75 MG EC tablet Take 1 tablet (75 mg total) by mouth 2 (two) times daily. 12/28/23   Reddick, Johnathan B, NP  EPINEPHrine  (EPIPEN  2-PAK) 0.3 mg/0.3 mL IJ SOAJ injection Inject 0.3 mg into the muscle as needed for anaphylaxis. 07/16/23   Vivienne Delon HERO,  PA-C  fluticasone  (FLONASE ) 50 MCG/ACT nasal spray Place 2 sprays into both nostrils daily. 02/02/24   Vivienne Delon HERO, PA-C  hydrocortisone  (ANUSOL -HC) 25 MG suppository Place 1 suppository (25 mg total) rectally 2 (two) times daily. 12/30/23   Blair, Diane W, FNP  lidocaine  (LIDODERM ) 5 % Place 1 patch onto the skin daily. Remove & Discard patch within 12 hours or as directed by MD 07/28/23   Barrett, Jamie N, PA-C  meclizine  (ANTIVERT ) 25 MG tablet Take 1 tablet (25 mg total) by mouth 3 (three) times daily as needed for dizziness. 12/13/23   Lavell Bari LABOR, FNP  nystatin  (MYCOSTATIN ) 100000 UNIT/ML suspension Take 5 mLs (500,000 Units total) by mouth 4 (four) times daily. 01/29/24   Vivienne Delon HERO, PA-C  ondansetron  (ZOFRAN ) 4 MG tablet Take 1 tablet (4 mg total) by mouth every 8 (eight) hours as needed for nausea or vomiting. 12/13/23   Hawks, Bari A, FNP  predniSONE  (STERAPRED UNI-PAK 21 TAB) 10 MG (21) TBPK tablet Take following package directions. 01/16/24   Kingston Robes, PA-C  promethazine -dextromethorphan (PROMETHAZINE -DM) 6.25-15 MG/5ML syrup Take 5 mLs by mouth at bedtime as needed for cough. 12/22/23   Loreda Myla SAUNDERS, NP    Family History Family History  Problem Relation Age of Onset   Rheum arthritis Mother    Rheum arthritis Father  Diabetes Brother     Social History Social History[1]   Allergies   Latex and Anusol -hc [hydrocortisone ]   Review of Systems Review of Systems  Skin:  Positive for rash.  All other systems reviewed and are negative.    Physical Exam Triage Vital Signs ED Triage Vitals  Encounter Vitals Group     BP 02/08/24 1947 (!) 130/91     Girls Systolic BP Percentile --      Girls Diastolic BP Percentile --      Boys Systolic BP Percentile --      Boys Diastolic BP Percentile --      Pulse Rate 02/08/24 1947 95     Resp 02/08/24 1947 16     Temp 02/08/24 1947 98 F (36.7 C)     Temp Source 02/08/24 1947 Oral     SpO2  02/08/24 1947 96 %     Weight --      Height --      Head Circumference --      Peak Flow --      Pain Score 02/08/24 1939 0     Pain Loc --      Pain Education --      Exclude from Growth Chart --    No data found.  Updated Vital Signs BP (!) 130/91 (BP Location: Right Arm)   Pulse 95   Temp 98 F (36.7 C) (Oral)   Resp 16   SpO2 96%   Visual Acuity Right Eye Distance:   Left Eye Distance:   Bilateral Distance:    Right Eye Near:   Left Eye Near:    Bilateral Near:     Physical Exam Vitals and nursing note reviewed.  Constitutional:      Appearance: He is well-developed.  HENT:     Head: Normocephalic.  Cardiovascular:     Rate and Rhythm: Normal rate.  Pulmonary:     Effort: Pulmonary effort is normal.  Abdominal:     General: There is no distension.  Genitourinary:    Comments: White moist appearing rash groin and scrotal area. Musculoskeletal:        General: Normal range of motion.  Skin:    General: Skin is warm.     Comments: Dried skin looks like mild eczema upper body and trunk  Neurological:     General: No focal deficit present.     Mental Status: He is alert and oriented to person, place, and time.      Chang Treatments / Results  Labs (all labs ordered are listed, but only abnormal results are displayed) Labs Reviewed - No data to display  EKG   Radiology No results found.  Procedures Procedures (including critical care time)  Medications Ordered in Chang Medications - No data to display  Initial Impression / Assessment and Plan / Chang Course  I have reviewed the triage vital signs and the nursing notes.  Pertinent labs & imaging results that were available during my care of the patient were reviewed by me and considered in my medical decision making (see chart for details).     Patient has jock itch that has been resistant to topical antifungals.  Patient is given a prescription for Diflucan .  He is advised to return if symptoms  worsen or change Final Clinical Impressions(s) / Chang Diagnoses   Final diagnoses:  Jock itch     Discharge Instructions      Use a good moisturizer to upper body.  Take diflucan  as directed   ED Prescriptions     Medication Sig Dispense Auth. Provider   fluconazole  (DIFLUCAN ) 150 MG tablet Take 1 tablet (150 mg total) by mouth daily. 14 tablet Rondall Radigan K, PA-C      PDMP not reviewed this encounter. An After Visit Summary was printed and given to the patient.        [1]  Social History Tobacco Use   Smoking status: Every Day    Types: Cigars   Smokeless tobacco: Never  Vaping Use   Vaping status: Never Used  Substance Use Topics   Alcohol use: Yes    Comment: Occassionally.   Drug use: Not Currently     Flint Sonny MARLA DEVONNA 02/08/24 2013  "

## 2024-02-08 NOTE — Discharge Instructions (Addendum)
 Use a good moisturizer to upper body.  Take diflucan  as directed

## 2024-02-08 NOTE — Progress Notes (Signed)
" °  Because this has been an ongoing issue and having used multiple antifungal treatments, I feel your condition warrants further evaluation and I recommend that you be seen in a face-to-face visit.   NOTE: There will be NO CHARGE for this E-Visit   If you are having a true medical emergency, please call 911.     For an urgent face to face visit, [facility] has multiple urgent care centers for your convenience.  Click the link below for the full list of locations and hours, walk-in wait times, appointment scheduling options and driving directions:  Urgent Care - [place], [place], [place], [place], [place], [place]  [place]     Your MyChart E-visit questionnaire answers were reviewed by a board certified advanced clinical practitioner to complete your personal care plan based on your specific symptoms.    Thank you for using e-Visits.    "

## 2024-02-08 NOTE — ED Triage Notes (Signed)
 Pt presents to UC for c/o rash on hands and groin area x2 weeks. He reports that his skin hurts and it itches x2 weeks. Hhe has tried creams, tylenol , ibuprofen  w/o relief. 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

## 2024-02-09 ENCOUNTER — Ambulatory Visit: Payer: Self-pay | Admitting: Podiatry

## 2024-02-15 ENCOUNTER — Ambulatory Visit: Admitting: Family Medicine

## 2024-02-23 ENCOUNTER — Ambulatory Visit: Admitting: Podiatry

## 2024-02-25 ENCOUNTER — Ambulatory Visit: Admitting: Podiatry

## 2024-03-01 ENCOUNTER — Telehealth: Payer: Self-pay | Admitting: Podiatry

## 2024-03-01 ENCOUNTER — Telehealth: Admitting: Family Medicine

## 2024-03-01 DIAGNOSIS — K0889 Other specified disorders of teeth and supporting structures: Secondary | ICD-10-CM

## 2024-03-01 DIAGNOSIS — H60509 Unspecified acute noninfective otitis externa, unspecified ear: Secondary | ICD-10-CM

## 2024-03-01 MED ORDER — IBUPROFEN 600 MG PO TABS: 600.0000 mg | ORAL_TABLET | Freq: Three times a day (TID) | ORAL | 0 refills | Status: AC | PRN

## 2024-03-01 MED ORDER — OFLOXACIN 0.3 % OT SOLN: 5.0000 [drp] | Freq: Every day | OTIC | 0 refills | Status: AC

## 2024-03-01 NOTE — Progress Notes (Signed)
 " Virtual Visit Consent   Jeffrey Chang, you are scheduled for a virtual visit with a [facility] provider today. Just as with appointments in the office, your consent must be obtained to participate. Your consent will be active for this visit and any virtual visit you may have with one of our providers in the next 365 days. If you have a MyChart account, a copy of this consent can be sent to you electronically.  As this is a virtual visit, video technology does not allow for your provider to perform a traditional examination. This may limit your provider's ability to fully assess your condition. If your provider identifies any concerns that need to be evaluated in person or the need to arrange testing (such as labs, EKG, etc.), we will make arrangements to do so. Although advances in technology are sophisticated, we cannot ensure that it will always work on either your end or our end. If the connection with a video visit is poor, the visit may have to be switched to a telephone visit. With either a video or telephone visit, we are not always able to ensure that we have a secure connection.  By engaging in this virtual visit, you consent to the provision of healthcare and authorize for your insurance to be billed (if applicable) for the services provided during this visit. Depending on your insurance coverage, you may receive a charge related to this service.  I need to obtain your verbal consent now. Are you willing to proceed with your visit today? Beverley Renderos has provided verbal consent on 03/01/2024 for a virtual visit (video or telephone). Loa Lamp, FNP  Date: 03/01/2024 7:33 PM   Virtual Visit via Video Note   I, Loa Lamp, connected with  Jeffrey Chang  ([id number], 25-Jul-1995) on 03/01/24 at  7:30 PM EST by a video-enabled telemedicine application and verified that I am speaking with the correct person using two identifiers.  Location: Patient: Virtual Visit Location Patient:  Home Provider: Virtual Visit Location Provider: Home Office   I discussed the limitations of evaluation and management by telemedicine and the availability of in person appointments. The patient expressed understanding and agreed to proceed.    History of Present Illness: Jeffrey Chang is a 29 y.o. who identifies as a male who was assigned male at birth, and is being seen today for ear pain worse on left, lymph node is swollen, no drainage, no fever, has had cold sx lately. Amoxicillin  does not help - drops help. Pain pressing tragus. SABRA  HPI: HPI  Problems:  Patient Active Problem List   Diagnosis Date Noted   Bipolar 1 disorder (HCC)     Allergies: Allergies[1] Medications: Current Medications[2]  Observations/Objective: Patient is well-developed, well-nourished in no acute distress.  Resting comfortably  at home.  Head is normocephalic, atraumatic.  No labored breathing.  Speech is clear and coherent with logical content.  Patient is alert and oriented at baseline.    Assessment and Plan: 1. Acute otitis externa, unspecified laterality, unspecified type (Primary)  UC if sx persist or worsen.   Follow Up Instructions: I discussed the assessment and treatment plan with the patient. The patient was provided an opportunity to ask questions and all were answered. The patient agreed with the plan and demonstrated an understanding of the instructions.  A copy of instructions were sent to the patient via MyChart unless otherwise noted below.     The patient was advised to call back or seek an in-person evaluation  if the symptoms worsen or if the condition fails to improve as anticipated.    [name], FNP   .e     [1]  Allergies Allergen Reactions   Latex Itching    Other Reaction(s): Other (See Comments)  REDNESS   Anusol -Hc [Hydrocortisone ] Itching and Rash  [2]  Current Outpatient Medications:    albuterol  (VENTOLIN  HFA) 108 (90 Base) MCG/ACT inhaler, Inhale  1-2 puffs into the lungs every 6 (six) hours as needed for wheezing or shortness of breath., Disp: 18 g, Rfl: 0   baclofen  (LIORESAL ) 10 MG tablet, Take 1 tablet (10 mg total) by mouth 3 (three) times daily., Disp: 30 each, Rfl: 0   benzonatate  (TESSALON ) 200 MG capsule, Take 1 capsule (200 mg total) by mouth 3 (three) times daily as needed., Disp: 20 capsule, Rfl: 0   diclofenac  (VOLTAREN ) 75 MG EC tablet, Take 1 tablet (75 mg total) by mouth 2 (two) times daily., Disp: 30 tablet, Rfl: 0   EPINEPHrine  (EPIPEN  2-PAK) 0.3 mg/0.3 mL IJ SOAJ injection, Inject 0.3 mg into the muscle as needed for anaphylaxis., Disp: 1 each, Rfl: 0   fluconazole  (DIFLUCAN ) 150 MG tablet, Take 1 tablet (150 mg total) by mouth daily., Disp: 14 tablet, Rfl: 0   fluticasone  (FLONASE ) 50 MCG/ACT nasal spray, Place 2 sprays into both nostrils daily., Disp: 16 g, Rfl: 0   hydrocortisone  (ANUSOL -HC) 25 MG suppository, Place 1 suppository (25 mg total) rectally 2 (two) times daily., Disp: 12 suppository, Rfl: 0   lidocaine  (LIDODERM ) 5 %, Place 1 patch onto the skin daily. Remove & Discard patch within 12 hours or as directed by MD, Disp: 30 patch, Rfl: 0   meclizine  (ANTIVERT ) 25 MG tablet, Take 1 tablet (25 mg total) by mouth 3 (three) times daily as needed for dizziness., Disp: 30 tablet, Rfl: 0   nystatin  (MYCOSTATIN ) 100000 UNIT/ML suspension, Take 5 mLs (500,000 Units total) by mouth 4 (four) times daily., Disp: 240 mL, Rfl: 0   ondansetron  (ZOFRAN ) 4 MG tablet, Take 1 tablet (4 mg total) by mouth every 8 (eight) hours as needed for nausea or vomiting., Disp: 20 tablet, Rfl: 0   predniSONE  (STERAPRED UNI-PAK 21 TAB) 10 MG (21) TBPK tablet, Take following package directions., Disp: 21 tablet, Rfl: 0   promethazine -dextromethorphan (PROMETHAZINE -DM) 6.25-15 MG/5ML syrup, Take 5 mLs by mouth at bedtime as needed for cough., Disp: 25 mL, Rfl: 0  "

## 2024-03-01 NOTE — Progress Notes (Signed)
 Pt did not show up for visit-DWB

## 2024-03-01 NOTE — Patient Instructions (Signed)
 Otitis Externa  Otitis externa is an infection of the outer ear canal. The outer ear canal is the area between the outside of the ear and the eardrum. Otitis externa is sometimes called swimmer's ear. What are the causes? Common causes of this condition include: Swimming in dirty water. Moisture in the ear. An injury to the inside of the ear. An object stuck in the ear. A cut or scrape on the outside of the ear or in the ear canal. What increases the risk? You are more likely to develop this condition if you go swimming often. What are the signs or symptoms? The first symptom of this condition is often itching in the ear. Later symptoms of the condition include: Swelling of the ear. Redness in the ear. Ear pain. The pain may get worse when you pull on your ear. Pus coming from the ear. How is this diagnosed? This condition may be diagnosed by examining the ear and testing fluid from the ear for bacteria and funguses. How is this treated? This condition may be treated with: Antibiotic ear drops. These are often given for 10-14 days. Medicines to reduce itching and swelling. Follow these instructions at home: If you were prescribed antibiotic ear drops, use them as told by your health care provider. Do not stop using the antibiotic even if you start to feel better. Take over-the-counter and prescription medicines only as told by your health care provider. Avoid getting water in your ears as told by your health care provider. This may include avoiding swimming or water sports for a few days. Keep all follow-up visits. This is important. How is this prevented? Keep your ears dry. Use the corner of a towel to dry your ears after you swim or bathe. Avoid scratching or putting things in your ear. Doing these things can damage the ear canal or remove the protective wax that lines it, which makes it easier for bacteria and funguses to grow. Avoid swimming in lakes, polluted water, or swimming  pools that may not have enough chlorine. Contact a health care provider if: You have a fever. Your ear is still red, swollen, painful, or draining pus after 3 days. Your redness, swelling, or pain gets worse. You have a severe headache. Get help right away if: You have redness, swelling, and pain or tenderness in the area behind your ear. Summary Otitis externa is an infection of the outer ear canal. Common causes include swimming in dirty water, moisture in the ear, or a cut or scrape in the ear. Symptoms include pain, redness, and swelling of the ear canal. If you were prescribed antibiotic ear drops, use them as told by your health care provider. Do not stop using the antibiotic even if you start to feel better. This information is not intended to replace advice given to you by your health care provider. Make sure you discuss any questions you have with your health care provider. Document Revised: 04/11/2020 Document Reviewed: 04/11/2020 Elsevier Patient Education  2024 [employer].

## 2024-03-01 NOTE — Telephone Encounter (Signed)
 Patient has scheduled with Dr. Verta, Dr Janit, and Dr Tobie and no show all of these.

## 2024-03-02 ENCOUNTER — Telehealth

## 2024-03-02 DIAGNOSIS — T148XXA Other injury of unspecified body region, initial encounter: Secondary | ICD-10-CM

## 2024-03-02 DIAGNOSIS — L0390 Cellulitis, unspecified: Secondary | ICD-10-CM

## 2024-03-03 NOTE — Progress Notes (Signed)
" °  Because of severity of symptoms, I feel your condition warrants further evaluation and I recommend that you be seen in an in-person evaluation. This is above the scope of what we can assess/manage via e-visit or video visit.   NOTE: There will be NO CHARGE for this E-Visit   If you are having a true medical emergency, please call 911.     For an urgent face to face visit, [facility] has multiple urgent care centers for your convenience.  Click the link below for the full list of locations and hours, walk-in wait times, appointment scheduling options and driving directions:  Urgent Care - [place], [place], [place], [place], [place], [place]  [place]     Your MyChart E-visit questionnaire answers were reviewed by a board certified advanced clinical practitioner to complete your personal care plan based on your specific symptoms.    Thank you for using e-Visits.    "

## 2024-03-07 ENCOUNTER — Telehealth

## 2024-03-08 ENCOUNTER — Telehealth

## 2024-03-08 ENCOUNTER — Telehealth: Admitting: Family Medicine

## 2024-03-08 DIAGNOSIS — K0889 Other specified disorders of teeth and supporting structures: Secondary | ICD-10-CM | POA: Diagnosis not present

## 2024-03-08 MED ORDER — AMOXICILLIN-POT CLAVULANATE 875-125 MG PO TABS: 1.0000 | ORAL_TABLET | Freq: Two times a day (BID) | ORAL | 0 refills | Status: AC

## 2024-03-08 NOTE — Patient Instructions (Signed)
 " Jeffrey Chang, thank you for joining [name], PA-C for today's virtual visit.  While this provider is not your primary care provider (PCP), if your PCP is located in our provider database this encounter information will be shared with them immediately following your visit.   A [facility] MyChart account gives you access to today's visit and all your visits, tests, and labs performed at [facility]  click here if you don't have a [facility] MyChart account or go to [email or web address]  Consent: (Patient) Jeffrey Chang provided verbal consent for this virtual visit at the beginning of the encounter.  Current Medications:  Current Outpatient Medications:    amoxicillin -clavulanate (AUGMENTIN ) 875-125 MG tablet, Take 1 tablet by mouth 2 (two) times daily for 7 days., Disp: 14 tablet, Rfl: 0   albuterol  (VENTOLIN  HFA) 108 (90 Base) MCG/ACT inhaler, Inhale 1-2 puffs into the lungs every 6 (six) hours as needed for wheezing or shortness of breath., Disp: 18 g, Rfl: 0   baclofen  (LIORESAL ) 10 MG tablet, Take 1 tablet (10 mg total) by mouth 3 (three) times daily., Disp: 30 each, Rfl: 0   benzonatate  (TESSALON ) 200 MG capsule, Take 1 capsule (200 mg total) by mouth 3 (three) times daily as needed., Disp: 20 capsule, Rfl: 0   diclofenac  (VOLTAREN ) 75 MG EC tablet, Take 1 tablet (75 mg total) by mouth 2 (two) times daily., Disp: 30 tablet, Rfl: 0   EPINEPHrine  (EPIPEN  2-PAK) 0.3 mg/0.3 mL IJ SOAJ injection, Inject 0.3 mg into the muscle as needed for anaphylaxis., Disp: 1 each, Rfl: 0   fluconazole  (DIFLUCAN ) 150 MG tablet, Take 1 tablet (150 mg total) by mouth daily., Disp: 14 tablet, Rfl: 0   fluticasone  (FLONASE ) 50 MCG/ACT nasal spray, Place 2 sprays into both nostrils daily., Disp: 16 g, Rfl: 0   hydrocortisone  (ANUSOL -HC) 25 MG suppository, Place 1 suppository (25 mg total) rectally 2 (two) times daily., Disp: 12 suppository, Rfl: 0   ibuprofen  (ADVIL ) 600 MG tablet, Take  1 tablet (600 mg total) by mouth every 8 (eight) hours as needed., Disp: 30 tablet, Rfl: 0   lidocaine  (LIDODERM ) 5 %, Place 1 patch onto the skin daily. Remove & Discard patch within 12 hours or as directed by MD, Disp: 30 patch, Rfl: 0   meclizine  (ANTIVERT ) 25 MG tablet, Take 1 tablet (25 mg total) by mouth 3 (three) times daily as needed for dizziness., Disp: 30 tablet, Rfl: 0   nystatin  (MYCOSTATIN ) 100000 UNIT/ML suspension, Take 5 mLs (500,000 Units total) by mouth 4 (four) times daily., Disp: 240 mL, Rfl: 0   ofloxacin  (FLOXIN ) 0.3 % OTIC solution, Place 5 drops into both ears daily for 7 days., Disp: 5 mL, Rfl: 0   ondansetron  (ZOFRAN ) 4 MG tablet, Take 1 tablet (4 mg total) by mouth every 8 (eight) hours as needed for nausea or vomiting., Disp: 20 tablet, Rfl: 0   predniSONE  (STERAPRED UNI-PAK 21 TAB) 10 MG (21) TBPK tablet, Take following package directions., Disp: 21 tablet, Rfl: 0   promethazine -dextromethorphan (PROMETHAZINE -DM) 6.25-15 MG/5ML syrup, Take 5 mLs by mouth at bedtime as needed for cough., Disp: 25 mL, Rfl: 0   Medications ordered in this encounter:  Meds ordered this encounter  Medications   amoxicillin -clavulanate (AUGMENTIN ) 875-125 MG tablet    Sig: Take 1 tablet by mouth 2 (two) times daily for 7 days.    Dispense:  14 tablet    Refill:  0     *If you need refills on other  medications prior to your next appointment, please contact your pharmacy*  Follow-Up: Call back or seek an in-person evaluation if the symptoms worsen or if the condition fails to improve as anticipated.  [facility] [phone]  Other Instructions Dental Abscess  A dental abscess is an area of pus in or around a tooth. It comes from an infection. It can cause pain and other symptoms. Treatment will help with symptoms and prevent the infection from spreading. What are the causes? This condition is caused by an infection in or around the tooth. This can be from: Very bad  tooth decay (cavities). A bad injury to the tooth, such as a broken or chipped tooth. What increases the risk? The risk to get an abscess is higher in males. It is also more likely in people who: Have dental decay. Have very bad gum disease. Eat sugary snacks between meals. Use tobacco. Have diabetes. Have a weak disease-fighting system (immune system). Do not brush their teeth regularly. What are the signs or symptoms? Some mild symptoms are: Tenderness. Bad breath. Fever. A sharp, sour taste in the mouth. Pain in and around the infected tooth. Worse symptoms of this condition include: Swollen neck glands. Chills. Pus draining around the tooth. Swelling and redness around the tooth, the mouth, or the face. Very bad pain in and around the tooth. The worst symptoms can include: Difficulty swallowing. Difficulty opening your mouth. Feeling like you may vomit or vomiting. How is this treated? This is treated by getting rid of the infection. Your dentist will discuss ways to do this, including: Antibiotic medicines. Antibacterial mouth rinse. An incision in the abscess to drain out the pus. A root canal. Removing the tooth. Follow these instructions at home: Medicines Take over-the-counter and prescription medicines only as told by your dentist. If you were prescribed an antibiotic medicine, take it as told by your dentist. Do not stop taking it even if you start to feel better. If you were prescribed a gel that has numbing medicine in it, use it exactly as told. Ask your dentist if you should avoid driving or using machines while you are taking your medicine. General instructions Rinse your mouth often with salt water. To make salt water, dissolve -1 tsp (3-6 g) of salt in 1 cup (237 mL) of warm water. Eat a soft diet while your mouth is healing. Drink enough fluid to keep your pee (urine) pale yellow. Do not apply heat to the outside of your mouth. Do not smoke or use any  products that contain nicotine  or tobacco. If you need help quitting, ask your dentist. Keep all follow-up visits. Prevent an abscess Brush your teeth every morning and every night. Use fluoride toothpaste. Floss your teeth each day. Get dental cleanings as often as told by your dentist. Think about getting dental sealant put on teeth that have deep holes (decay). Drink water that has fluoride in it. Most tap water has fluoride. Check the label on bottled water to see if it has fluoride in it. Drink water instead of sugary drinks. Eat healthy meals and snacks. Wear a mouth guard or face shield when you play sports. Contact a doctor if: Your pain is worse and medicine does not help. Get help right away if: You have a fever or chills. Your symptoms suddenly get worse. You have a very bad headache. You have problems breathing or swallowing. You have trouble opening your mouth. You have swelling in your neck or close to  your eye. These symptoms may be an emergency. Get help right away. Call your local emergency services (911 in the [place] Do not wait to see if the symptoms will go away. Do not drive yourself to the hospital. Summary A dental abscess is an area of pus in or around a tooth. It is caused by an infection. Treatment will help with symptoms and prevent the infection from spreading. Take over-the-counter and prescription medicines only as told by your dentist. To prevent an abscess, take good care of your teeth. Brush your teeth every morning and night. Use floss every day. Get dental cleanings as often as told by your dentist. This information is not intended to replace advice given to you by your health care provider. Make sure you discuss any questions you have with your health care provider. Document Revised: 04/04/2020 Document Reviewed: 04/05/2020 Elsevier Patient Education  2024 Elsevier Inc.   If you have been instructed to have an in-person evaluation today at a  local Urgent Care facility, please use the link below. It will take you to a list of all of our available [facility], including address, phone number and hours of operation. Please do not delay care.  [facility]  If you or a family member do not have a primary care provider, use the link below to schedule a visit and establish care. When you choose a [facility] primary care physician or advanced practice provider, you gain a long-term partner in health. Find a Primary Care Provider  Learn more about [facility]'s in-office and virtual care options: [facility] - Get Care Now  "

## 2024-03-08 NOTE — Progress Notes (Signed)
 " Virtual Visit Consent   Jeffrey Chang, you are scheduled for a virtual visit with a [facility] provider today. Just as with appointments in the office, your consent must be obtained to participate. Your consent will be active for this visit and any virtual visit you may have with one of our providers in the next 365 days. If you have a MyChart account, a copy of this consent can be sent to you electronically.  As this is a virtual visit, video technology does not allow for your provider to perform a traditional examination. This may limit your provider's ability to fully assess your condition. If your provider identifies any concerns that need to be evaluated in person or the need to arrange testing (such as labs, EKG, etc.), we will make arrangements to do so. Although advances in technology are sophisticated, we cannot ensure that it will always work on either your end or our end. If the connection with a video visit is poor, the visit may have to be switched to a telephone visit. With either a video or telephone visit, we are not always able to ensure that we have a secure connection.  By engaging in this virtual visit, you consent to the provision of healthcare and authorize for your insurance to be billed (if applicable) for the services provided during this visit. Depending on your insurance coverage, you may receive a charge related to this service.  I need to obtain your verbal consent now. Are you willing to proceed with your visit today? Jeffrey Chang has provided verbal consent on 03/08/2024 for a virtual visit (video or telephone). Jeffrey Chang, [place]  Date: 03/08/2024 7:06 PM   Virtual Visit via Video Note   I, Jeffrey Chang, connected with  Jeffrey Chang  ([id number], [birth year]) on 03/08/24 at  7:00 PM EST by a video-enabled telemedicine application and verified that I am speaking with the correct person using two identifiers.  Location: Patient: Virtual Visit Location Patient:  Home Provider: Virtual Visit Location Provider: Home Office   I discussed the limitations of evaluation and management by telemedicine and the availability of in person appointments. The patient expressed understanding and agreed to proceed.    History of Present Illness: Jeffrey Chang is a 29 y.o. who identifies as a male who was assigned male at birth, and is being seen today for c/o a tooth abscess.  Pt states the symptoms started 3-4 days ago.  Pt states he tried oragel but has not helped.  Pt states he has a lot of pain and has some pain with chewing.  Pt states he has left side facial swelling. Pt denies fever.  Pt states he has tried to make an appointment with dental but they are booked up.   HPI: HPI  Problems:  Patient Active Problem List   Diagnosis Date Noted   Bipolar 1 disorder (HCC)     Allergies: Allergies[1] Medications: Current Medications[2]  Observations/Objective: Patient is well-developed, well-nourished in no acute distress.  Resting comfortably  at home.  Head is normocephalic, atraumatic.  No labored breathing.  Speech is clear and coherent with logical content.  Patient is alert and oriented at baseline.    Assessment and Plan: 1. Pain, dental (Primary) - amoxicillin -clavulanate (AUGMENTIN ) 875-125 MG tablet; Take 1 tablet by mouth 2 (two) times daily for 7 days.  Dispense: 14 tablet; Refill: 0  -Pt advised to follow up with dental for worsening or persistent pain  Follow Up Instructions: I discussed the assessment and  treatment plan with the patient. The patient was provided an opportunity to ask questions and all were answered. The patient agreed with the plan and demonstrated an understanding of the instructions.  A copy of instructions were sent to the patient via MyChart unless otherwise noted below.    The patient was advised to call back or seek an in-person evaluation if the symptoms worsen or if the condition fails to improve as anticipated.     Jeffrey Mater, PA-C    [1]  Allergies Allergen Reactions   Latex Itching    Other Reaction(s): Other (See Comments)  REDNESS   Anusol -Hc [Hydrocortisone ] Itching and Rash  [2]  Current Outpatient Medications:    amoxicillin -clavulanate (AUGMENTIN ) 875-125 MG tablet, Take 1 tablet by mouth 2 (two) times daily for 7 days., Disp: 14 tablet, Rfl: 0   albuterol  (VENTOLIN  HFA) 108 (90 Base) MCG/ACT inhaler, Inhale 1-2 puffs into the lungs every 6 (six) hours as needed for wheezing or shortness of breath., Disp: 18 g, Rfl: 0   baclofen  (LIORESAL ) 10 MG tablet, Take 1 tablet (10 mg total) by mouth 3 (three) times daily., Disp: 30 each, Rfl: 0   benzonatate  (TESSALON ) 200 MG capsule, Take 1 capsule (200 mg total) by mouth 3 (three) times daily as needed., Disp: 20 capsule, Rfl: 0   diclofenac  (VOLTAREN ) 75 MG EC tablet, Take 1 tablet (75 mg total) by mouth 2 (two) times daily., Disp: 30 tablet, Rfl: 0   EPINEPHrine  (EPIPEN  2-PAK) 0.3 mg/0.3 mL IJ SOAJ injection, Inject 0.3 mg into the muscle as needed for anaphylaxis., Disp: 1 each, Rfl: 0   fluconazole  (DIFLUCAN ) 150 MG tablet, Take 1 tablet (150 mg total) by mouth daily., Disp: 14 tablet, Rfl: 0   fluticasone  (FLONASE ) 50 MCG/ACT nasal spray, Place 2 sprays into both nostrils daily., Disp: 16 g, Rfl: 0   hydrocortisone  (ANUSOL -HC) 25 MG suppository, Place 1 suppository (25 mg total) rectally 2 (two) times daily., Disp: 12 suppository, Rfl: 0   ibuprofen  (ADVIL ) 600 MG tablet, Take 1 tablet (600 mg total) by mouth every 8 (eight) hours as needed., Disp: 30 tablet, Rfl: 0   lidocaine  (LIDODERM ) 5 %, Place 1 patch onto the skin daily. Remove & Discard patch within 12 hours or as directed by MD, Disp: 30 patch, Rfl: 0   meclizine  (ANTIVERT ) 25 MG tablet, Take 1 tablet (25 mg total) by mouth 3 (three) times daily as needed for dizziness., Disp: 30 tablet, Rfl: 0   nystatin  (MYCOSTATIN ) 100000 UNIT/ML suspension, Take 5 mLs (500,000 Units total) by  mouth 4 (four) times daily., Disp: 240 mL, Rfl: 0   ofloxacin  (FLOXIN ) 0.3 % OTIC solution, Place 5 drops into both ears daily for 7 days., Disp: 5 mL, Rfl: 0   ondansetron  (ZOFRAN ) 4 MG tablet, Take 1 tablet (4 mg total) by mouth every 8 (eight) hours as needed for nausea or vomiting., Disp: 20 tablet, Rfl: 0   predniSONE  (STERAPRED UNI-PAK 21 TAB) 10 MG (21) TBPK tablet, Take following package directions., Disp: 21 tablet, Rfl: 0   promethazine -dextromethorphan (PROMETHAZINE -DM) 6.25-15 MG/5ML syrup, Take 5 mLs by mouth at bedtime as needed for cough., Disp: 25 mL, Rfl: 0  "

## 2024-03-14 ENCOUNTER — Telehealth: Admitting: Family Medicine

## 2024-03-14 NOTE — Progress Notes (Signed)
"  °  Because you have been seen several times by our group in the last several weeks, an in person appt is warranted for on going care needs. We feel your condition warrants further evaluation and recommend that you be seen in a face-to-face visit.   NOTE: There will be NO CHARGE for this E-Visit   If you are having a true medical emergency, please call 911.  "

## 2024-03-17 ENCOUNTER — Ambulatory Visit: Admitting: Podiatry
